# Patient Record
Sex: Female | Born: 1945 | Race: White | Hispanic: No | Marital: Married | Smoking: Former smoker
Health system: Southern US, Community
[De-identification: ages and names within clinical notes are randomized; demographics above are authoritative.]

## PROBLEM LIST (undated history)

## (undated) DIAGNOSIS — F329 Major depressive disorder, single episode, unspecified: Secondary | ICD-10-CM

## (undated) DIAGNOSIS — E785 Hyperlipidemia, unspecified: Secondary | ICD-10-CM

## (undated) DIAGNOSIS — D689 Coagulation defect, unspecified: Secondary | ICD-10-CM

## (undated) DIAGNOSIS — I10 Essential (primary) hypertension: Secondary | ICD-10-CM

## (undated) DIAGNOSIS — R011 Cardiac murmur, unspecified: Secondary | ICD-10-CM

## (undated) DIAGNOSIS — R05 Cough: Secondary | ICD-10-CM

## (undated) DIAGNOSIS — G459 Transient cerebral ischemic attack, unspecified: Secondary | ICD-10-CM

## (undated) DIAGNOSIS — J189 Pneumonia, unspecified organism: Secondary | ICD-10-CM

## (undated) DIAGNOSIS — D6800 Von Willebrand disease, unspecified: Secondary | ICD-10-CM

## (undated) DIAGNOSIS — I5032 Chronic diastolic (congestive) heart failure: Secondary | ICD-10-CM

## (undated) DIAGNOSIS — T8859XA Other complications of anesthesia, initial encounter: Secondary | ICD-10-CM

## (undated) DIAGNOSIS — M545 Low back pain, unspecified: Secondary | ICD-10-CM

## (undated) DIAGNOSIS — J449 Chronic obstructive pulmonary disease, unspecified: Secondary | ICD-10-CM

## (undated) DIAGNOSIS — M069 Rheumatoid arthritis, unspecified: Secondary | ICD-10-CM

## (undated) DIAGNOSIS — F32A Depression, unspecified: Secondary | ICD-10-CM

## (undated) DIAGNOSIS — I639 Cerebral infarction, unspecified: Secondary | ICD-10-CM

## (undated) DIAGNOSIS — T4145XA Adverse effect of unspecified anesthetic, initial encounter: Secondary | ICD-10-CM

## (undated) DIAGNOSIS — E039 Hypothyroidism, unspecified: Secondary | ICD-10-CM

## (undated) DIAGNOSIS — N189 Chronic kidney disease, unspecified: Secondary | ICD-10-CM

## (undated) DIAGNOSIS — D6861 Antiphospholipid syndrome: Secondary | ICD-10-CM

## (undated) DIAGNOSIS — I350 Nonrheumatic aortic (valve) stenosis: Secondary | ICD-10-CM

## (undated) DIAGNOSIS — IMO0001 Reserved for inherently not codable concepts without codable children: Secondary | ICD-10-CM

## (undated) DIAGNOSIS — D509 Iron deficiency anemia, unspecified: Secondary | ICD-10-CM

## (undated) DIAGNOSIS — I2510 Atherosclerotic heart disease of native coronary artery without angina pectoris: Secondary | ICD-10-CM

## (undated) DIAGNOSIS — E669 Obesity, unspecified: Secondary | ICD-10-CM

## (undated) HISTORY — DX: Low back pain, unspecified: M54.50

## (undated) HISTORY — DX: Nonrheumatic aortic (valve) stenosis: I35.0

## (undated) HISTORY — PX: STAPEDECTOMY: SHX2435

## (undated) HISTORY — DX: Antiphospholipid syndrome: D68.61

## (undated) HISTORY — PX: CHOLECYSTECTOMY: SHX55

## (undated) HISTORY — DX: Hypothyroidism, unspecified: E03.9

## (undated) HISTORY — DX: Atherosclerotic heart disease of native coronary artery without angina pectoris: I25.10

## (undated) HISTORY — DX: Essential (primary) hypertension: I10

## (undated) HISTORY — DX: Rheumatoid arthritis, unspecified: M06.9

## (undated) HISTORY — DX: Low back pain: M54.5

## (undated) HISTORY — DX: Coagulation defect, unspecified: D68.9

## (undated) HISTORY — DX: Obesity, unspecified: E66.9

## (undated) HISTORY — DX: Chronic diastolic (congestive) heart failure: I50.32

## (undated) HISTORY — DX: Hyperlipidemia, unspecified: E78.5

## (undated) HISTORY — DX: Chronic obstructive pulmonary disease, unspecified: J44.9

## (undated) HISTORY — DX: Transient cerebral ischemic attack, unspecified: G45.9

## (undated) HISTORY — PX: TOTAL ABDOMINAL HYSTERECTOMY: SHX209

## (undated) HISTORY — PX: TONSILLECTOMY: SUR1361

## (undated) HISTORY — DX: Cough: R05

## (undated) HISTORY — DX: Iron deficiency anemia, unspecified: D50.9

## (undated) HISTORY — PX: APPENDECTOMY: SHX54

## (undated) HISTORY — DX: Chronic kidney disease, unspecified: N18.9

---

## 1998-09-22 DIAGNOSIS — I639 Cerebral infarction, unspecified: Secondary | ICD-10-CM

## 1998-09-22 HISTORY — DX: Cerebral infarction, unspecified: I63.9

## 1998-09-24 ENCOUNTER — Other Ambulatory Visit: Admission: RE | Admit: 1998-09-24 | Discharge: 1998-09-24 | Payer: Self-pay | Admitting: *Deleted

## 1999-07-05 ENCOUNTER — Encounter: Payer: Self-pay | Admitting: Internal Medicine

## 1999-07-06 ENCOUNTER — Encounter: Payer: Self-pay | Admitting: Internal Medicine

## 1999-07-06 ENCOUNTER — Inpatient Hospital Stay (HOSPITAL_COMMUNITY): Admission: EM | Admit: 1999-07-06 | Discharge: 1999-07-08 | Payer: Self-pay | Admitting: Internal Medicine

## 2000-04-13 ENCOUNTER — Encounter: Payer: Self-pay | Admitting: Internal Medicine

## 2000-04-13 ENCOUNTER — Ambulatory Visit (HOSPITAL_COMMUNITY): Admission: RE | Admit: 2000-04-13 | Discharge: 2000-04-13 | Payer: Self-pay | Admitting: Internal Medicine

## 2000-04-14 ENCOUNTER — Encounter: Payer: Self-pay | Admitting: Internal Medicine

## 2000-04-14 ENCOUNTER — Ambulatory Visit (HOSPITAL_COMMUNITY): Admission: RE | Admit: 2000-04-14 | Discharge: 2000-04-14 | Payer: Self-pay | Admitting: Internal Medicine

## 2001-07-11 ENCOUNTER — Ambulatory Visit (HOSPITAL_COMMUNITY): Admission: RE | Admit: 2001-07-11 | Discharge: 2001-07-11 | Payer: Self-pay | Admitting: Oncology

## 2001-07-18 ENCOUNTER — Ambulatory Visit (HOSPITAL_COMMUNITY): Admission: RE | Admit: 2001-07-18 | Discharge: 2001-07-18 | Payer: Self-pay | Admitting: Oncology

## 2003-08-04 ENCOUNTER — Inpatient Hospital Stay (HOSPITAL_COMMUNITY): Admission: EM | Admit: 2003-08-04 | Discharge: 2003-08-05 | Payer: Self-pay | Admitting: Emergency Medicine

## 2004-05-06 ENCOUNTER — Encounter: Payer: Self-pay | Admitting: Internal Medicine

## 2004-05-10 ENCOUNTER — Ambulatory Visit (HOSPITAL_COMMUNITY): Admission: RE | Admit: 2004-05-10 | Discharge: 2004-05-10 | Payer: Self-pay | Admitting: Internal Medicine

## 2004-05-14 ENCOUNTER — Ambulatory Visit (HOSPITAL_COMMUNITY): Admission: RE | Admit: 2004-05-14 | Discharge: 2004-05-14 | Payer: Self-pay | Admitting: *Deleted

## 2004-05-14 ENCOUNTER — Encounter: Payer: Self-pay | Admitting: Internal Medicine

## 2004-08-13 ENCOUNTER — Ambulatory Visit: Payer: Self-pay | Admitting: Oncology

## 2004-09-17 ENCOUNTER — Ambulatory Visit (HOSPITAL_COMMUNITY): Admission: RE | Admit: 2004-09-17 | Discharge: 2004-09-17 | Payer: Self-pay | Admitting: Internal Medicine

## 2004-10-28 ENCOUNTER — Ambulatory Visit: Payer: Self-pay | Admitting: Oncology

## 2004-12-27 ENCOUNTER — Ambulatory Visit: Payer: Self-pay | Admitting: Oncology

## 2005-01-20 ENCOUNTER — Encounter: Payer: Self-pay | Admitting: Internal Medicine

## 2005-01-20 ENCOUNTER — Ambulatory Visit: Payer: Self-pay | Admitting: Internal Medicine

## 2005-02-21 ENCOUNTER — Ambulatory Visit: Payer: Self-pay | Admitting: Oncology

## 2005-04-25 ENCOUNTER — Ambulatory Visit: Payer: Self-pay | Admitting: Oncology

## 2005-05-20 ENCOUNTER — Ambulatory Visit: Payer: Self-pay | Admitting: Internal Medicine

## 2005-05-20 ENCOUNTER — Encounter: Payer: Self-pay | Admitting: Internal Medicine

## 2005-05-27 ENCOUNTER — Ambulatory Visit: Payer: Self-pay | Admitting: Cardiology

## 2005-06-11 ENCOUNTER — Ambulatory Visit: Payer: Self-pay | Admitting: Oncology

## 2005-07-28 ENCOUNTER — Ambulatory Visit: Payer: Self-pay | Admitting: Oncology

## 2005-09-28 ENCOUNTER — Emergency Department (HOSPITAL_COMMUNITY): Admission: EM | Admit: 2005-09-28 | Discharge: 2005-09-28 | Payer: Self-pay | Admitting: Emergency Medicine

## 2005-10-14 ENCOUNTER — Ambulatory Visit: Payer: Self-pay | Admitting: Oncology

## 2005-12-15 ENCOUNTER — Ambulatory Visit: Payer: Self-pay | Admitting: Oncology

## 2006-01-01 LAB — PROTIME-INR
INR: 3.4 (ref 2.00–3.50)
Protime: 22.3 Seconds — ABNORMAL HIGH (ref 10.6–13.4)

## 2006-01-08 LAB — PROTIME-INR
INR: 2.5 (ref 2.00–3.50)
Protime: 19 Seconds — ABNORMAL HIGH (ref 10.6–13.4)

## 2006-01-23 LAB — PROTIME-INR
INR: 2.4 (ref 2.00–3.50)
Protime: 18.9 Seconds — ABNORMAL HIGH (ref 10.6–13.4)

## 2006-02-06 ENCOUNTER — Ambulatory Visit: Payer: Self-pay | Admitting: Oncology

## 2006-02-10 LAB — PROTIME-INR
INR: 3.7 (ref 2.00–3.50)
Protime: 23.4 Seconds (ref 10.6–13.4)

## 2006-03-10 LAB — PROTIME-INR
INR: 1.5 (ref 2.00–3.50)
Protime: 15 Seconds (ref 10.6–13.4)

## 2006-03-19 LAB — PROTIME-INR
INR: 1.8 — ABNORMAL LOW (ref 2.00–3.50)
Protime: 16.7 Seconds — ABNORMAL HIGH (ref 10.6–13.4)

## 2006-04-01 ENCOUNTER — Ambulatory Visit: Payer: Self-pay | Admitting: Oncology

## 2006-05-05 LAB — PROTIME-INR
INR: 2.4 (ref 2.00–3.50)
Protime: 28.8 s — ABNORMAL HIGH (ref 10.6–13.4)

## 2006-06-09 ENCOUNTER — Ambulatory Visit: Payer: Self-pay | Admitting: Oncology

## 2006-07-01 LAB — PROTIME-INR: INR: 2.1 (ref 2.00–3.50)

## 2006-07-27 ENCOUNTER — Ambulatory Visit: Payer: Self-pay | Admitting: Oncology

## 2006-07-29 LAB — PROTIME-INR
INR: 1.9 — ABNORMAL LOW (ref 2.00–3.50)
Protime: 22.8 Seconds — ABNORMAL HIGH (ref 10.6–13.4)

## 2006-08-19 LAB — PROTIME-INR

## 2006-08-27 LAB — PROTIME-INR
INR: 1.9 — ABNORMAL LOW (ref 2.00–3.50)
Protime: 22.8 Seconds — ABNORMAL HIGH (ref 10.6–13.4)

## 2006-09-17 ENCOUNTER — Ambulatory Visit: Payer: Self-pay | Admitting: Oncology

## 2006-09-23 LAB — PROTIME-INR

## 2006-10-21 LAB — PROTIME-INR

## 2006-10-28 LAB — PROTIME-INR
INR: 2.7 (ref 2.00–3.50)
Protime: 32.4 Seconds — ABNORMAL HIGH (ref 10.6–13.4)

## 2006-11-16 ENCOUNTER — Ambulatory Visit: Payer: Self-pay | Admitting: Oncology

## 2006-11-18 LAB — PROTIME-INR
INR: 2.9 (ref 2.00–3.50)
Protime: 34.8 Seconds — ABNORMAL HIGH (ref 10.6–13.4)

## 2006-12-22 ENCOUNTER — Encounter: Admission: RE | Admit: 2006-12-22 | Discharge: 2006-12-22 | Payer: Self-pay | Admitting: Orthopedic Surgery

## 2006-12-22 LAB — PROTIME-INR: INR: 3.2 (ref 2.00–3.50)

## 2007-01-12 ENCOUNTER — Ambulatory Visit: Payer: Self-pay | Admitting: Oncology

## 2007-01-15 LAB — CBC WITH DIFFERENTIAL/PLATELET
BASO%: 0.5 % (ref 0.0–2.0)
EOS%: 2.1 % (ref 0.0–7.0)
HCT: 38.6 % (ref 34.8–46.6)
LYMPH%: 22.4 % (ref 14.0–48.0)
MCH: 28.8 pg (ref 26.0–34.0)
MCHC: 34.7 g/dL (ref 32.0–36.0)
MCV: 83.2 fL (ref 81.0–101.0)
MONO#: 0.4 10*3/uL (ref 0.1–0.9)
NEUT%: 69.3 % (ref 39.6–76.8)
Platelets: 167 10*3/uL (ref 145–400)

## 2007-01-20 LAB — CARDIOLIPIN ANTIBODIES, IGG, IGM, IGA
Anticardiolipin IgA: <7 [APL'U] (ref <13)
Anticardiolipin IgM: <7 [MPL'U] (ref <10)

## 2007-01-20 LAB — LUPUS ANTICOAGULANT PANEL
DRVVT 1:1 Mix: 47 secs (ref 36.1–47.0)
DRVVT: 93.6 secs — ABNORMAL HIGH (ref 36.1–47.0)
PTTLA 4:1 Mix: 79.4 secs — ABNORMAL HIGH (ref 36.3–48.8)
PTTLA Confirmation: 0 secs (ref <8.0)

## 2007-02-18 LAB — PROTIME-INR
INR: 3.3 (ref 2.00–3.50)
Protime: 39.6 Seconds — ABNORMAL HIGH (ref 10.6–13.4)

## 2007-03-15 ENCOUNTER — Ambulatory Visit: Payer: Self-pay | Admitting: Oncology

## 2007-03-18 LAB — PROTIME-INR
INR: 3.6 — ABNORMAL HIGH (ref 2.00–3.50)
Protime: 43.2 Seconds — ABNORMAL HIGH (ref 10.6–13.4)

## 2007-04-15 LAB — PROTIME-INR: INR: 2.4 (ref 2.00–3.50)

## 2007-05-09 ENCOUNTER — Ambulatory Visit: Payer: Self-pay | Admitting: Oncology

## 2007-05-13 LAB — PROTIME-INR: INR: 3.3 (ref 2.00–3.50)

## 2007-06-10 LAB — PROTIME-INR: Protime: 37.2 Seconds — ABNORMAL HIGH (ref 10.6–13.4)

## 2007-07-06 ENCOUNTER — Ambulatory Visit: Payer: Self-pay | Admitting: Oncology

## 2007-07-08 LAB — PROTIME-INR: Protime: 32.4 Seconds — ABNORMAL HIGH (ref 10.6–13.4)

## 2007-08-05 LAB — PROTIME-INR: Protime: 32.4 Seconds — ABNORMAL HIGH (ref 10.6–13.4)

## 2007-08-31 ENCOUNTER — Ambulatory Visit: Payer: Self-pay | Admitting: Oncology

## 2007-09-02 LAB — PROTIME-INR
INR: 2.9 (ref 2.00–3.50)
Protime: 34.8 Seconds — ABNORMAL HIGH (ref 10.6–13.4)

## 2007-09-30 LAB — PROTIME-INR
INR: 3.7 — ABNORMAL HIGH (ref 2.00–3.50)
Protime: 44.4 Seconds — ABNORMAL HIGH (ref 10.6–13.4)

## 2007-10-26 ENCOUNTER — Ambulatory Visit: Payer: Self-pay | Admitting: Oncology

## 2007-10-28 LAB — PROTIME-INR
INR: 4.1 — ABNORMAL HIGH (ref 2.00–3.50)
Protime: 49.2 Seconds — ABNORMAL HIGH (ref 10.6–13.4)

## 2007-11-15 LAB — PROTIME-INR

## 2007-11-25 LAB — PROTIME-INR

## 2007-12-07 ENCOUNTER — Ambulatory Visit: Payer: Self-pay | Admitting: Oncology

## 2007-12-09 LAB — PROTIME-INR
INR: 2.7 (ref 2.00–3.50)
Protime: 32.4 Seconds — ABNORMAL HIGH (ref 10.6–13.4)

## 2007-12-23 LAB — PROTIME-INR
INR: 2.5 (ref 2.00–3.50)
Protime: 30 Seconds — ABNORMAL HIGH (ref 10.6–13.4)

## 2008-01-18 ENCOUNTER — Ambulatory Visit: Payer: Self-pay | Admitting: Oncology

## 2008-01-20 ENCOUNTER — Encounter: Payer: Self-pay | Admitting: Internal Medicine

## 2008-01-20 LAB — PROTIME-INR
INR: 2.8 (ref 2.00–3.50)
Protime: 33.6 Seconds — ABNORMAL HIGH (ref 10.6–13.4)

## 2008-03-14 ENCOUNTER — Ambulatory Visit: Payer: Self-pay | Admitting: Oncology

## 2008-04-13 LAB — PROTIME-INR: INR: 2.1 (ref 2.00–3.50)

## 2008-05-07 ENCOUNTER — Ambulatory Visit: Payer: Self-pay | Admitting: Oncology

## 2008-05-11 LAB — PROTIME-INR: INR: 2.8 (ref 2.00–3.50)

## 2008-07-04 ENCOUNTER — Ambulatory Visit: Payer: Self-pay | Admitting: Oncology

## 2008-07-06 LAB — PROTIME-INR
INR: 2.3 (ref 2.00–3.50)
Protime: 27.6 Seconds — ABNORMAL HIGH (ref 10.6–13.4)

## 2008-08-03 LAB — PROTIME-INR: Protime: 33.6 Seconds — ABNORMAL HIGH (ref 10.6–13.4)

## 2008-08-29 ENCOUNTER — Ambulatory Visit: Payer: Self-pay | Admitting: Oncology

## 2008-08-30 LAB — PROTIME-INR

## 2008-09-18 LAB — PROTIME-INR

## 2008-10-24 ENCOUNTER — Ambulatory Visit: Payer: Self-pay | Admitting: Oncology

## 2008-10-31 LAB — PROTIME-INR
INR: 2.8 (ref 2.00–3.50)
Protime: 33.6 Seconds — ABNORMAL HIGH (ref 10.6–13.4)

## 2008-11-23 LAB — PROTIME-INR

## 2008-12-18 ENCOUNTER — Ambulatory Visit: Payer: Self-pay | Admitting: Oncology

## 2008-12-27 LAB — PROTIME-INR: INR: 1.9 — ABNORMAL LOW (ref 2.00–3.50)

## 2009-01-08 LAB — PROTIME-INR
INR: 3.1 (ref 2.00–3.50)
Protime: 37.2 Seconds — ABNORMAL HIGH (ref 10.6–13.4)

## 2009-01-25 ENCOUNTER — Encounter: Payer: Self-pay | Admitting: Internal Medicine

## 2009-01-25 LAB — PROTIME-INR: Protime: 40.8 Seconds — ABNORMAL HIGH (ref 10.6–13.4)

## 2009-02-20 ENCOUNTER — Ambulatory Visit: Payer: Self-pay | Admitting: Oncology

## 2009-02-22 LAB — PROTIME-INR: Protime: 33.6 Seconds — ABNORMAL HIGH (ref 10.6–13.4)

## 2009-02-22 LAB — CBC WITH DIFFERENTIAL/PLATELET
EOS%: 4.9 % (ref 0.0–7.0)
MCH: 29.2 pg (ref 25.1–34.0)
MCV: 86.6 fL (ref 79.5–101.0)
MONO%: 7.1 % (ref 0.0–14.0)
NEUT#: 5.6 10*3/uL (ref 1.5–6.5)
RBC: 4.69 10*6/uL (ref 3.70–5.45)
RDW: 13.8 % (ref 11.2–14.5)
nRBC: 0 % (ref 0–0)

## 2009-03-22 ENCOUNTER — Ambulatory Visit: Payer: Self-pay | Admitting: Oncology

## 2009-03-22 LAB — PROTIME-INR
INR: 4.2 — ABNORMAL HIGH (ref 2.00–3.50)
Protime: 50.4 Seconds — ABNORMAL HIGH (ref 10.6–13.4)

## 2009-03-22 LAB — CBC WITH DIFFERENTIAL/PLATELET
Basophils Absolute: 0 10*3/uL (ref 0.0–0.1)
EOS%: 2.1 % (ref 0.0–7.0)
Eosinophils Absolute: 0.2 10*3/uL (ref 0.0–0.5)
HCT: 38.5 % (ref 34.8–46.6)
HGB: 13 g/dL (ref 11.6–15.9)
MCH: 29.1 pg (ref 25.1–34.0)
MONO#: 0.6 10*3/uL (ref 0.1–0.9)
NEUT#: 6.5 10*3/uL (ref 1.5–6.5)
RDW: 14.3 % (ref 11.2–14.5)
WBC: 8.4 10*3/uL (ref 3.9–10.3)
lymph#: 1.2 10*3/uL (ref 0.9–3.3)

## 2009-04-19 LAB — CBC WITH DIFFERENTIAL/PLATELET
EOS%: 2.4 % (ref 0.0–7.0)
Eosinophils Absolute: 0.2 10*3/uL (ref 0.0–0.5)
MCV: 86.3 fL (ref 79.5–101.0)
MONO%: 8.6 % (ref 0.0–14.0)
NEUT#: 4.9 10*3/uL (ref 1.5–6.5)
RBC: 4.51 10*6/uL (ref 3.70–5.45)
RDW: 14.7 % — ABNORMAL HIGH (ref 11.2–14.5)
lymph#: 2.2 10*3/uL (ref 0.9–3.3)

## 2009-04-19 LAB — PROTIME-INR
INR: 4.3 — ABNORMAL HIGH (ref 2.00–3.50)
Protime: 51.6 Seconds — ABNORMAL HIGH (ref 10.6–13.4)

## 2009-05-01 ENCOUNTER — Ambulatory Visit: Payer: Self-pay | Admitting: Oncology

## 2009-05-01 LAB — PROTIME-INR
INR: 2.7 (ref 2.00–3.50)
Protime: 32.4 Seconds — ABNORMAL HIGH (ref 10.6–13.4)

## 2009-05-17 LAB — CBC WITH DIFFERENTIAL/PLATELET
BASO%: 0.7 % (ref 0.0–2.0)
EOS%: 5.6 % (ref 0.0–7.0)
HGB: 13.4 g/dL (ref 11.6–15.9)
MCH: 29.6 pg (ref 25.1–34.0)
MCHC: 33.7 g/dL (ref 31.5–36.0)
RBC: 4.53 10*6/uL (ref 3.70–5.45)
RDW: 14.3 % (ref 11.2–14.5)
lymph#: 2 10*3/uL (ref 0.9–3.3)

## 2009-05-17 LAB — PROTIME-INR: INR: 3.4 (ref 2.00–3.50)

## 2009-06-12 ENCOUNTER — Ambulatory Visit: Payer: Self-pay | Admitting: Oncology

## 2009-06-14 LAB — CBC WITH DIFFERENTIAL/PLATELET
Basophils Absolute: 0 10*3/uL (ref 0.0–0.1)
Eosinophils Absolute: 0.2 10*3/uL (ref 0.0–0.5)
HGB: 13.3 g/dL (ref 11.6–15.9)
MCV: 88.5 fL (ref 79.5–101.0)
MONO%: 9.5 % (ref 0.0–14.0)
NEUT#: 2.9 10*3/uL (ref 1.5–6.5)
Platelets: 147 10*3/uL (ref 145–400)
RDW: 13 % (ref 11.2–14.5)

## 2009-06-14 LAB — PROTIME-INR: INR: 2.3 (ref 2.00–3.50)

## 2009-07-10 ENCOUNTER — Ambulatory Visit: Payer: Self-pay | Admitting: Oncology

## 2009-07-12 LAB — CBC WITH DIFFERENTIAL/PLATELET
Basophils Absolute: 0 10*3/uL (ref 0.0–0.1)
EOS%: 3.6 % (ref 0.0–7.0)
Eosinophils Absolute: 0.2 10*3/uL (ref 0.0–0.5)
HCT: 39.5 % (ref 34.8–46.6)
HGB: 13.4 g/dL (ref 11.6–15.9)
MONO#: 0.5 10*3/uL (ref 0.1–0.9)
NEUT#: 3.4 10*3/uL (ref 1.5–6.5)
NEUT%: 57.8 % (ref 38.4–76.8)
RDW: 12.8 % (ref 11.2–14.5)
WBC: 5.9 10*3/uL (ref 3.9–10.3)
lymph#: 1.7 10*3/uL (ref 0.9–3.3)

## 2009-07-12 LAB — PROTIME-INR: INR: 2.3 (ref 2.00–3.50)

## 2009-08-09 ENCOUNTER — Ambulatory Visit: Payer: Self-pay | Admitting: Oncology

## 2009-08-09 LAB — CBC WITH DIFFERENTIAL/PLATELET
Basophils Absolute: 0 10*3/uL (ref 0.0–0.1)
Eosinophils Absolute: 0.2 10*3/uL (ref 0.0–0.5)
HCT: 40.6 % (ref 34.8–46.6)
HGB: 13.5 g/dL (ref 11.6–15.9)
MCH: 30 pg (ref 25.1–34.0)
NEUT#: 3.5 10*3/uL (ref 1.5–6.5)
NEUT%: 53.4 % (ref 38.4–76.8)
RDW: 13.1 % (ref 11.2–14.5)
lymph#: 2.2 10*3/uL (ref 0.9–3.3)

## 2009-08-09 LAB — PROTIME-INR: INR: 3.6 — ABNORMAL HIGH (ref 2.00–3.50)

## 2009-09-07 LAB — CBC WITH DIFFERENTIAL/PLATELET
Basophils Absolute: 0 10*3/uL (ref 0.0–0.1)
EOS%: 3.2 % (ref 0.0–7.0)
Eosinophils Absolute: 0.2 10*3/uL (ref 0.0–0.5)
HCT: 39 % (ref 34.8–46.6)
HGB: 12.9 g/dL (ref 11.6–15.9)
MCH: 29.7 pg (ref 25.1–34.0)
MCV: 89.7 fL (ref 79.5–101.0)
MONO%: 7.2 % (ref 0.0–14.0)
NEUT#: 3.8 10*3/uL (ref 1.5–6.5)
NEUT%: 55.5 % (ref 38.4–76.8)
RDW: 13.3 % (ref 11.2–14.5)
lymph#: 2.3 10*3/uL (ref 0.9–3.3)

## 2009-09-07 LAB — PROTIME-INR: INR: 2.2 (ref 2.00–3.50)

## 2009-10-02 ENCOUNTER — Ambulatory Visit: Payer: Self-pay | Admitting: Oncology

## 2009-10-04 LAB — CBC WITH DIFFERENTIAL/PLATELET
BASO%: 0.6 % (ref 0.0–2.0)
Basophils Absolute: 0.1 10*3/uL (ref 0.0–0.1)
EOS%: 3.5 % (ref 0.0–7.0)
Eosinophils Absolute: 0.3 10*3/uL (ref 0.0–0.5)
HCT: 41.8 % (ref 34.8–46.6)
HGB: 13.8 g/dL (ref 11.6–15.9)
LYMPH%: 29.2 % (ref 14.0–49.7)
MCH: 29.6 pg (ref 25.1–34.0)
MCHC: 33 g/dL (ref 31.5–36.0)
MCV: 89.7 fL (ref 79.5–101.0)
MONO#: 0.7 10*3/uL (ref 0.1–0.9)
MONO%: 8.2 % (ref 0.0–14.0)
NEUT#: 4.7 10*3/uL (ref 1.5–6.5)
NEUT%: 58.5 % (ref 38.4–76.8)
Platelets: 162 10*3/uL (ref 145–400)
RBC: 4.66 10*6/uL (ref 3.70–5.45)
RDW: 13.7 % (ref 11.2–14.5)
WBC: 8 10*3/uL (ref 3.9–10.3)
lymph#: 2.3 10*3/uL (ref 0.9–3.3)

## 2009-10-04 LAB — PROTIME-INR
INR: 2.1 (ref 2.00–3.50)
Protime: 25.2 Seconds — ABNORMAL HIGH (ref 10.6–13.4)

## 2009-11-01 ENCOUNTER — Ambulatory Visit: Payer: Self-pay | Admitting: Oncology

## 2009-11-01 LAB — CBC WITH DIFFERENTIAL/PLATELET
BASO%: 0.6 % (ref 0.0–2.0)
Basophils Absolute: 0 10*3/uL (ref 0.0–0.1)
EOS%: 2.8 % (ref 0.0–7.0)
Eosinophils Absolute: 0.2 10*3/uL (ref 0.0–0.5)
HCT: 40.5 % (ref 34.8–46.6)
HGB: 13.4 g/dL (ref 11.6–15.9)
LYMPH%: 24.7 % (ref 14.0–49.7)
MCH: 29.6 pg (ref 25.1–34.0)
MCHC: 33.1 g/dL (ref 31.5–36.0)
MCV: 89.6 fL (ref 79.5–101.0)
MONO#: 0.5 10*3/uL (ref 0.1–0.9)
MONO%: 7 % (ref 0.0–14.0)
NEUT#: 4.6 10*3/uL (ref 1.5–6.5)
NEUT%: 64.9 % (ref 38.4–76.8)
Platelets: 160 10*3/uL (ref 145–400)
RBC: 4.52 10*6/uL (ref 3.70–5.45)
RDW: 13.4 % (ref 11.2–14.5)
WBC: 7.1 10*3/uL (ref 3.9–10.3)
lymph#: 1.7 10*3/uL (ref 0.9–3.3)

## 2009-11-01 LAB — PROTIME-INR

## 2009-11-29 LAB — PROTIME-INR
INR: 2.3 (ref 2.00–3.50)
Protime: 27.6 Seconds — ABNORMAL HIGH (ref 10.6–13.4)

## 2009-11-29 LAB — CBC WITH DIFFERENTIAL/PLATELET
BASO%: 0.2 % (ref 0.0–2.0)
Basophils Absolute: 0 10*3/uL (ref 0.0–0.1)
EOS%: 0.2 % (ref 0.0–7.0)
Eosinophils Absolute: 0 10*3/uL (ref 0.0–0.5)
HCT: 41.6 % (ref 34.8–46.6)
HGB: 13.6 g/dL (ref 11.6–15.9)
LYMPH%: 12.6 % — ABNORMAL LOW (ref 14.0–49.7)
MCH: 29.4 pg (ref 25.1–34.0)
MCHC: 32.7 g/dL (ref 31.5–36.0)
MCV: 90 fL (ref 79.5–101.0)
MONO#: 0.6 10*3/uL (ref 0.1–0.9)
MONO%: 4.9 % (ref 0.0–14.0)
NEUT#: 10.3 10*3/uL — ABNORMAL HIGH (ref 1.5–6.5)
NEUT%: 82.1 % — ABNORMAL HIGH (ref 38.4–76.8)
Platelets: 196 10*3/uL (ref 145–400)
RBC: 4.62 10*6/uL (ref 3.70–5.45)
RDW: 13.3 % (ref 11.2–14.5)
WBC: 12.5 10*3/uL — ABNORMAL HIGH (ref 3.9–10.3)
lymph#: 1.6 10*3/uL (ref 0.9–3.3)
nRBC: 0 % (ref 0–0)

## 2009-12-25 ENCOUNTER — Ambulatory Visit: Payer: Self-pay | Admitting: Oncology

## 2009-12-27 LAB — CBC WITH DIFFERENTIAL/PLATELET
BASO%: 0.3 % (ref 0.0–2.0)
Basophils Absolute: 0 10*3/uL (ref 0.0–0.1)
EOS%: 3.8 % (ref 0.0–7.0)
Eosinophils Absolute: 0.2 10*3/uL (ref 0.0–0.5)
HCT: 38.4 % (ref 34.8–46.6)
HGB: 12.8 g/dL (ref 11.6–15.9)
LYMPH%: 34 % (ref 14.0–49.7)
MCH: 29.5 pg (ref 25.1–34.0)
MCHC: 33.3 g/dL (ref 31.5–36.0)
MCV: 88.5 fL (ref 79.5–101.0)
MONO#: 0.4 10*3/uL (ref 0.1–0.9)
MONO%: 7.2 % (ref 0.0–14.0)
NEUT#: 3.2 10*3/uL (ref 1.5–6.5)
NEUT%: 54.7 % (ref 38.4–76.8)
Platelets: 148 10*3/uL (ref 145–400)
RBC: 4.34 10*6/uL (ref 3.70–5.45)
RDW: 13.4 % (ref 11.2–14.5)
WBC: 5.8 10*3/uL (ref 3.9–10.3)
lymph#: 2 10*3/uL (ref 0.9–3.3)
nRBC: 0 % (ref 0–0)

## 2009-12-27 LAB — PROTIME-INR
INR: 2.9 (ref 2.00–3.50)
Protime: 34.8 Seconds — ABNORMAL HIGH (ref 10.6–13.4)

## 2010-01-30 ENCOUNTER — Ambulatory Visit: Payer: Self-pay | Admitting: Oncology

## 2010-01-31 ENCOUNTER — Encounter: Payer: Self-pay | Admitting: Internal Medicine

## 2010-01-31 LAB — CBC WITH DIFFERENTIAL/PLATELET
BASO%: 0.5 % (ref 0.0–2.0)
Basophils Absolute: 0 10*3/uL (ref 0.0–0.1)
EOS%: 4.2 % (ref 0.0–7.0)
Eosinophils Absolute: 0.3 10*3/uL (ref 0.0–0.5)
HCT: 41.6 % (ref 34.8–46.6)
HGB: 13.9 g/dL (ref 11.6–15.9)
LYMPH%: 27.7 % (ref 14.0–49.7)
MCH: 29.5 pg (ref 25.1–34.0)
MCHC: 33.4 g/dL (ref 31.5–36.0)
MCV: 88.3 fL (ref 79.5–101.0)
MONO#: 0.5 10*3/uL (ref 0.1–0.9)
MONO%: 7.5 % (ref 0.0–14.0)
NEUT#: 3.7 10*3/uL (ref 1.5–6.5)
NEUT%: 60.1 % (ref 38.4–76.8)
Platelets: 151 10*3/uL (ref 145–400)
RBC: 4.71 10*6/uL (ref 3.70–5.45)
RDW: 13.7 % (ref 11.2–14.5)
WBC: 6.1 10*3/uL (ref 3.9–10.3)
lymph#: 1.7 10*3/uL (ref 0.9–3.3)

## 2010-01-31 LAB — PROTIME-INR
INR: 2.8 (ref 2.00–3.50)
Protime: 33.6 Seconds — ABNORMAL HIGH (ref 10.6–13.4)

## 2010-02-13 ENCOUNTER — Encounter: Payer: Self-pay | Admitting: Cardiology

## 2010-02-21 ENCOUNTER — Encounter (INDEPENDENT_AMBULATORY_CARE_PROVIDER_SITE_OTHER): Payer: Self-pay | Admitting: *Deleted

## 2010-02-27 LAB — PROTIME-INR
INR: 2.6 (ref 2.00–3.50)
Protime: 31.2 Seconds — ABNORMAL HIGH (ref 10.6–13.4)

## 2010-02-27 LAB — CBC WITH DIFFERENTIAL/PLATELET
BASO%: 0.3 % (ref 0.0–2.0)
Basophils Absolute: 0 10*3/uL (ref 0.0–0.1)
EOS%: 1.9 % (ref 0.0–7.0)
Eosinophils Absolute: 0.2 10*3/uL (ref 0.0–0.5)
HCT: 42.6 % (ref 34.8–46.6)
HGB: 14.1 g/dL (ref 11.6–15.9)
LYMPH%: 15.6 % (ref 14.0–49.7)
MCH: 29.6 pg (ref 25.1–34.0)
MCHC: 33.1 g/dL (ref 31.5–36.0)
MCV: 89.5 fL (ref 79.5–101.0)
MONO#: 0.9 10*3/uL (ref 0.1–0.9)
MONO%: 9.9 % (ref 0.0–14.0)
NEUT#: 6.7 10*3/uL — ABNORMAL HIGH (ref 1.5–6.5)
NEUT%: 72.3 % (ref 38.4–76.8)
Platelets: 156 10*3/uL (ref 145–400)
RBC: 4.76 10*6/uL (ref 3.70–5.45)
RDW: 13.9 % (ref 11.2–14.5)
WBC: 9.3 10*3/uL (ref 3.9–10.3)
lymph#: 1.5 10*3/uL (ref 0.9–3.3)
nRBC: 0 % (ref 0–0)

## 2010-03-28 ENCOUNTER — Ambulatory Visit: Payer: Self-pay | Admitting: Oncology

## 2010-04-01 LAB — CBC WITH DIFFERENTIAL/PLATELET
BASO%: 0.2 % (ref 0.0–2.0)
Basophils Absolute: 0 10*3/uL (ref 0.0–0.1)
EOS%: 1.7 % (ref 0.0–7.0)
Eosinophils Absolute: 0.2 10*3/uL (ref 0.0–0.5)
HCT: 40.9 % (ref 34.8–46.6)
HGB: 13.5 g/dL (ref 11.6–15.9)
LYMPH%: 16.6 % (ref 14.0–49.7)
MCH: 29.3 pg (ref 25.1–34.0)
MCHC: 33 g/dL (ref 31.5–36.0)
MCV: 88.9 fL (ref 79.5–101.0)
MONO#: 0.8 10*3/uL (ref 0.1–0.9)
MONO%: 7.9 % (ref 0.0–14.0)
NEUT#: 7 10*3/uL — ABNORMAL HIGH (ref 1.5–6.5)
NEUT%: 73.6 % (ref 38.4–76.8)
Platelets: 168 10*3/uL (ref 145–400)
RBC: 4.6 10*6/uL (ref 3.70–5.45)
RDW: 14 % (ref 11.2–14.5)
WBC: 9.5 10*3/uL (ref 3.9–10.3)
lymph#: 1.6 10*3/uL (ref 0.9–3.3)

## 2010-04-01 LAB — PROTIME-INR
INR: 3.8 — ABNORMAL HIGH (ref 2.00–3.50)
Protime: 45.6 Seconds — ABNORMAL HIGH (ref 10.6–13.4)

## 2010-04-26 LAB — CBC WITH DIFFERENTIAL/PLATELET
BASO%: 0.2 % (ref 0.0–2.0)
Basophils Absolute: 0 10*3/uL (ref 0.0–0.1)
EOS%: 0.7 % (ref 0.0–7.0)
Eosinophils Absolute: 0.1 10*3/uL (ref 0.0–0.5)
HCT: 38 % (ref 34.8–46.6)
HGB: 12.4 g/dL (ref 11.6–15.9)
LYMPH%: 10.9 % — ABNORMAL LOW (ref 14.0–49.7)
MCH: 29 pg (ref 25.1–34.0)
MCHC: 32.6 g/dL (ref 31.5–36.0)
MCV: 89 fL (ref 79.5–101.0)
MONO#: 0.7 10*3/uL (ref 0.1–0.9)
MONO%: 6.9 % (ref 0.0–14.0)
NEUT#: 7.9 10*3/uL — ABNORMAL HIGH (ref 1.5–6.5)
NEUT%: 81.3 % — ABNORMAL HIGH (ref 38.4–76.8)
Platelets: 184 10*3/uL (ref 145–400)
RBC: 4.27 10*6/uL (ref 3.70–5.45)
RDW: 14.1 % (ref 11.2–14.5)
WBC: 9.8 10*3/uL (ref 3.9–10.3)
lymph#: 1.1 10*3/uL (ref 0.9–3.3)

## 2010-04-26 LAB — PROTIME-INR
INR: 2.2 (ref 2.00–3.50)
Protime: 26.4 Seconds — ABNORMAL HIGH (ref 10.6–13.4)

## 2010-04-29 ENCOUNTER — Ambulatory Visit: Payer: Self-pay | Admitting: Cardiology

## 2010-04-29 DIAGNOSIS — R011 Cardiac murmur, unspecified: Secondary | ICD-10-CM

## 2010-04-29 DIAGNOSIS — I10 Essential (primary) hypertension: Secondary | ICD-10-CM

## 2010-05-09 ENCOUNTER — Telehealth: Payer: Self-pay | Admitting: Cardiology

## 2010-05-10 ENCOUNTER — Telehealth: Payer: Self-pay | Admitting: Cardiology

## 2010-05-14 ENCOUNTER — Encounter: Payer: Self-pay | Admitting: Cardiology

## 2010-05-14 ENCOUNTER — Ambulatory Visit (HOSPITAL_COMMUNITY): Admission: RE | Admit: 2010-05-14 | Discharge: 2010-05-14 | Payer: Self-pay | Admitting: Cardiology

## 2010-05-14 ENCOUNTER — Encounter (INDEPENDENT_AMBULATORY_CARE_PROVIDER_SITE_OTHER): Payer: Self-pay | Admitting: *Deleted

## 2010-05-14 ENCOUNTER — Ambulatory Visit: Payer: Self-pay | Admitting: Cardiovascular Disease

## 2010-05-14 ENCOUNTER — Ambulatory Visit: Payer: Self-pay

## 2010-05-15 ENCOUNTER — Telehealth: Payer: Self-pay | Admitting: Cardiology

## 2010-05-17 ENCOUNTER — Ambulatory Visit: Payer: Self-pay | Admitting: Oncology

## 2010-05-21 LAB — CBC WITH DIFFERENTIAL/PLATELET
BASO%: 0.3 % (ref 0.0–2.0)
Basophils Absolute: 0 10*3/uL (ref 0.0–0.1)
EOS%: 1.5 % (ref 0.0–7.0)
Eosinophils Absolute: 0.2 10*3/uL (ref 0.0–0.5)
HCT: 39.3 % (ref 34.8–46.6)
HGB: 12.7 g/dL (ref 11.6–15.9)
LYMPH%: 16.2 % (ref 14.0–49.7)
MCH: 28.7 pg (ref 25.1–34.0)
MCHC: 32.3 g/dL (ref 31.5–36.0)
MCV: 88.7 fL (ref 79.5–101.0)
MONO#: 0.9 10*3/uL (ref 0.1–0.9)
MONO%: 7.7 % (ref 0.0–14.0)
NEUT#: 8.2 10*3/uL — ABNORMAL HIGH (ref 1.5–6.5)
NEUT%: 74.3 % (ref 38.4–76.8)
Platelets: 186 10*3/uL (ref 145–400)
RBC: 4.43 10*6/uL (ref 3.70–5.45)
RDW: 14.3 % (ref 11.2–14.5)
WBC: 11.1 10*3/uL — ABNORMAL HIGH (ref 3.9–10.3)
lymph#: 1.8 10*3/uL (ref 0.9–3.3)

## 2010-05-21 LAB — PROTIME-INR
INR: 2.2 (ref 2.00–3.50)
Protime: 26.4 Seconds — ABNORMAL HIGH (ref 10.6–13.4)

## 2010-05-23 ENCOUNTER — Telehealth: Payer: Self-pay | Admitting: Cardiology

## 2010-05-31 ENCOUNTER — Telehealth: Payer: Self-pay | Admitting: Cardiology

## 2010-05-31 ENCOUNTER — Ambulatory Visit: Payer: Self-pay | Admitting: Cardiology

## 2010-05-31 LAB — CONVERTED CEMR LAB
BUN: 19 mg/dL (ref 6–23)
CO2: 35 meq/L — ABNORMAL HIGH (ref 19–32)
Calcium: 9.6 mg/dL (ref 8.4–10.5)
Chloride: 99 meq/L (ref 96–112)
Creatinine, Ser: 1.3 mg/dL — ABNORMAL HIGH (ref 0.4–1.2)
GFR calc non Af Amer: 44.78 mL/min (ref >60)
Glucose, Bld: 121 mg/dL — ABNORMAL HIGH (ref 70–99)
Potassium: 3.1 meq/L — ABNORMAL LOW (ref 3.5–5.1)
Sodium: 144 meq/L (ref 135–145)

## 2010-06-04 ENCOUNTER — Ambulatory Visit: Payer: Self-pay | Admitting: Cardiology

## 2010-06-06 ENCOUNTER — Telehealth: Payer: Self-pay | Admitting: Cardiology

## 2010-06-12 ENCOUNTER — Telehealth: Payer: Self-pay | Admitting: Cardiology

## 2010-06-18 ENCOUNTER — Telehealth: Payer: Self-pay | Admitting: Cardiology

## 2010-06-18 ENCOUNTER — Ambulatory Visit: Payer: Self-pay | Admitting: Oncology

## 2010-06-18 LAB — CBC WITH DIFFERENTIAL/PLATELET
BASO%: 0.2 % (ref 0.0–2.0)
Basophils Absolute: 0 10*3/uL (ref 0.0–0.1)
EOS%: 0.6 % (ref 0.0–7.0)
Eosinophils Absolute: 0.1 10*3/uL (ref 0.0–0.5)
HCT: 38.1 % (ref 34.8–46.6)
HGB: 12.1 g/dL (ref 11.6–15.9)
LYMPH%: 10.5 % — ABNORMAL LOW (ref 14.0–49.7)
MCH: 28.5 pg (ref 25.1–34.0)
MCHC: 31.8 g/dL (ref 31.5–36.0)
MCV: 89.6 fL (ref 79.5–101.0)
MONO#: 0.4 10*3/uL (ref 0.1–0.9)
MONO%: 4.5 % (ref 0.0–14.0)
NEUT#: 8.2 10*3/uL — ABNORMAL HIGH (ref 1.5–6.5)
NEUT%: 84.2 % — ABNORMAL HIGH (ref 38.4–76.8)
Platelets: 189 10*3/uL (ref 145–400)
RBC: 4.25 10*6/uL (ref 3.70–5.45)
RDW: 14.3 % (ref 11.2–14.5)
WBC: 9.7 10*3/uL (ref 3.9–10.3)
lymph#: 1 10*3/uL (ref 0.9–3.3)
nRBC: 0 % (ref 0–0)

## 2010-06-18 LAB — PROTIME-INR
INR: 2.9 (ref 2.00–3.50)
Protime: 34.8 Seconds — ABNORMAL HIGH (ref 10.6–13.4)

## 2010-06-19 ENCOUNTER — Telehealth: Payer: Self-pay | Admitting: Cardiology

## 2010-06-19 ENCOUNTER — Encounter: Payer: Self-pay | Admitting: Cardiology

## 2010-06-24 ENCOUNTER — Encounter: Payer: Self-pay | Admitting: Cardiology

## 2010-06-24 ENCOUNTER — Ambulatory Visit: Payer: Self-pay

## 2010-06-27 ENCOUNTER — Telehealth: Payer: Self-pay | Admitting: Cardiology

## 2010-07-09 ENCOUNTER — Telehealth: Payer: Self-pay | Admitting: Cardiology

## 2010-07-16 ENCOUNTER — Ambulatory Visit: Payer: Self-pay | Admitting: Cardiology

## 2010-07-16 LAB — PROTIME-INR
INR: 3.3 (ref 2.00–3.50)
Protime: 39.6 Seconds — ABNORMAL HIGH (ref 10.6–13.4)

## 2010-07-16 LAB — CBC WITH DIFFERENTIAL/PLATELET
BASO%: 0.4 % (ref 0.0–2.0)
Basophils Absolute: 0 10*3/uL (ref 0.0–0.1)
EOS%: 0.7 % (ref 0.0–7.0)
Eosinophils Absolute: 0.1 10*3/uL (ref 0.0–0.5)
HCT: 38 % (ref 34.8–46.6)
HGB: 12.6 g/dL (ref 11.6–15.9)
LYMPH%: 9.6 % — ABNORMAL LOW (ref 14.0–49.7)
MCH: 28.9 pg (ref 25.1–34.0)
MCHC: 33 g/dL (ref 31.5–36.0)
MCV: 87.5 fL (ref 79.5–101.0)
MONO#: 0.3 10*3/uL (ref 0.1–0.9)
MONO%: 2.6 % (ref 0.0–14.0)
NEUT#: 8.3 10*3/uL — ABNORMAL HIGH (ref 1.5–6.5)
NEUT%: 86.7 % — ABNORMAL HIGH (ref 38.4–76.8)
Platelets: 192 10*3/uL (ref 145–400)
RBC: 4.34 10*6/uL (ref 3.70–5.45)
RDW: 15.4 % — ABNORMAL HIGH (ref 11.2–14.5)
WBC: 9.6 10*3/uL (ref 3.9–10.3)
lymph#: 0.9 10*3/uL (ref 0.9–3.3)

## 2010-07-22 LAB — CONVERTED CEMR LAB
BUN: 23 mg/dL (ref 6–23)
CO2: 31 meq/L (ref 19–32)
Calcium: 9.2 mg/dL (ref 8.4–10.5)
Chloride: 99 meq/L (ref 96–112)
Creatinine, Ser: 1.2 mg/dL (ref 0.4–1.2)
GFR calc non Af Amer: 46.45 mL/min (ref >60)
Glucose, Bld: 116 mg/dL — ABNORMAL HIGH (ref 70–99)
Potassium: 4.3 meq/L (ref 3.5–5.1)
Sodium: 139 meq/L (ref 135–145)

## 2010-07-24 ENCOUNTER — Ambulatory Visit: Payer: Self-pay | Admitting: Oncology

## 2010-07-24 ENCOUNTER — Emergency Department (HOSPITAL_COMMUNITY): Admission: EM | Admit: 2010-07-24 | Discharge: 2010-07-25 | Payer: Self-pay | Admitting: Emergency Medicine

## 2010-07-24 LAB — CBC WITH DIFFERENTIAL/PLATELET
BASO%: 0.5 % (ref 0.0–2.0)
Eosinophils Absolute: 0.1 10*3/uL (ref 0.0–0.5)
LYMPH%: 33 % (ref 14.0–49.7)
MCHC: 32.8 g/dL (ref 31.5–36.0)
MCV: 87.7 fL (ref 79.5–101.0)
MONO#: 0.5 10*3/uL (ref 0.1–0.9)
MONO%: 5.8 % (ref 0.0–14.0)
NEUT#: 4.7 10*3/uL (ref 1.5–6.5)
Platelets: 210 10*3/uL (ref 145–400)
RBC: 4.33 10*6/uL (ref 3.70–5.45)
RDW: 15.6 % — ABNORMAL HIGH (ref 11.2–14.5)
WBC: 7.9 10*3/uL (ref 3.9–10.3)

## 2010-07-24 LAB — PROTIME-INR
INR: 2.1 (ref 2.00–3.50)
Protime: 25.2 Seconds — ABNORMAL HIGH (ref 10.6–13.4)

## 2010-07-26 ENCOUNTER — Telehealth: Payer: Self-pay | Admitting: Cardiology

## 2010-07-28 ENCOUNTER — Encounter: Payer: Self-pay | Admitting: Internal Medicine

## 2010-07-31 LAB — PROTIME-INR
INR: 1.8 — ABNORMAL LOW (ref 2.00–3.50)
Protime: 21.6 Seconds — ABNORMAL HIGH (ref 10.6–13.4)

## 2010-07-31 LAB — CBC WITH DIFFERENTIAL/PLATELET
BASO%: 0.3 % (ref 0.0–2.0)
Eosinophils Absolute: 0.1 10*3/uL (ref 0.0–0.5)
MCHC: 31.9 g/dL (ref 31.5–36.0)
MONO#: 0.9 10*3/uL (ref 0.1–0.9)
NEUT#: 8.6 10*3/uL — ABNORMAL HIGH (ref 1.5–6.5)
RBC: 3.84 10*6/uL (ref 3.70–5.45)
WBC: 10.9 10*3/uL — ABNORMAL HIGH (ref 3.9–10.3)
lymph#: 1.3 10*3/uL (ref 0.9–3.3)
nRBC: 0 % (ref 0–0)

## 2010-08-13 ENCOUNTER — Ambulatory Visit: Payer: Self-pay | Admitting: Cardiology

## 2010-08-13 LAB — CBC WITH DIFFERENTIAL/PLATELET
BASO%: 0.1 % (ref 0.0–2.0)
EOS%: 0.7 % (ref 0.0–7.0)
MCH: 29 pg (ref 25.1–34.0)
MCHC: 33 g/dL (ref 31.5–36.0)
NEUT%: 85.5 % — ABNORMAL HIGH (ref 38.4–76.8)
RBC: 3.64 10*6/uL — ABNORMAL LOW (ref 3.70–5.45)
RDW: 16.8 % — ABNORMAL HIGH (ref 11.2–14.5)
WBC: 7.3 10*3/uL (ref 3.9–10.3)
lymph#: 0.9 10*3/uL (ref 0.9–3.3)

## 2010-08-13 LAB — PROTIME-INR
INR: 2.6 (ref 2.00–3.50)
Protime: 31.2 Seconds — ABNORMAL HIGH (ref 10.6–13.4)

## 2010-08-21 ENCOUNTER — Telehealth: Payer: Self-pay | Admitting: Cardiology

## 2010-09-02 ENCOUNTER — Ambulatory Visit: Payer: Self-pay | Admitting: Cardiology

## 2010-09-02 DIAGNOSIS — I10 Essential (primary) hypertension: Secondary | ICD-10-CM | POA: Insufficient documentation

## 2010-09-09 LAB — CONVERTED CEMR LAB
BUN: 27 mg/dL — ABNORMAL HIGH (ref 6–23)
CO2: 31 meq/L (ref 19–32)
Calcium: 9.8 mg/dL (ref 8.4–10.5)
Chloride: 101 meq/L (ref 96–112)
Creatinine, Ser: 1.4 mg/dL — ABNORMAL HIGH (ref 0.4–1.2)
GFR calc non Af Amer: 41.7 mL/min — ABNORMAL LOW (ref >60.00)
Glucose, Bld: 117 mg/dL — ABNORMAL HIGH (ref 70–99)
Potassium: 4.3 meq/L (ref 3.5–5.1)
Sodium: 143 meq/L (ref 135–145)

## 2010-09-10 ENCOUNTER — Ambulatory Visit: Payer: Self-pay | Admitting: Oncology

## 2010-09-10 LAB — CBC WITH DIFFERENTIAL/PLATELET
BASO%: 0.3 % (ref 0.0–2.0)
Basophils Absolute: 0 10*3/uL (ref 0.0–0.1)
Eosinophils Absolute: 0 10*3/uL (ref 0.0–0.5)
HCT: 31.6 % — ABNORMAL LOW (ref 34.8–46.6)
HGB: 10.6 g/dL — ABNORMAL LOW (ref 11.6–15.9)
LYMPH%: 9.6 % — ABNORMAL LOW (ref 14.0–49.7)
MONO#: 0.1 10*3/uL (ref 0.1–0.9)
NEUT#: 5.6 10*3/uL (ref 1.5–6.5)
NEUT%: 88.1 % — ABNORMAL HIGH (ref 38.4–76.8)
Platelets: 190 10*3/uL (ref 145–400)
WBC: 6.3 10*3/uL (ref 3.9–10.3)
lymph#: 0.6 10*3/uL — ABNORMAL LOW (ref 0.9–3.3)

## 2010-09-10 LAB — PROTIME-INR

## 2010-10-08 LAB — CBC WITH DIFFERENTIAL/PLATELET
BASO%: 0.3 % (ref 0.0–2.0)
Basophils Absolute: 0 10*3/uL (ref 0.0–0.1)
EOS%: 2.6 % (ref 0.0–7.0)
Eosinophils Absolute: 0.2 10*3/uL (ref 0.0–0.5)
HCT: 35.4 % (ref 34.8–46.6)
HGB: 11.4 g/dL — ABNORMAL LOW (ref 11.6–15.9)
LYMPH%: 10.2 % — ABNORMAL LOW (ref 14.0–49.7)
MCH: 29.4 pg (ref 25.1–34.0)
MCHC: 32.2 g/dL (ref 31.5–36.0)
MCV: 91.2 fL (ref 79.5–101.0)
MONO#: 0.6 10*3/uL (ref 0.1–0.9)
MONO%: 8.2 % (ref 0.0–14.0)
NEUT#: 5.5 10*3/uL (ref 1.5–6.5)
NEUT%: 78.7 % — ABNORMAL HIGH (ref 38.4–76.8)
Platelets: 120 10*3/uL — ABNORMAL LOW (ref 145–400)
RBC: 3.88 10*6/uL (ref 3.70–5.45)
RDW: 17.2 % — ABNORMAL HIGH (ref 11.2–14.5)
WBC: 7 10*3/uL (ref 3.9–10.3)
lymph#: 0.7 10*3/uL — ABNORMAL LOW (ref 0.9–3.3)
nRBC: 0 % (ref 0–0)

## 2010-10-08 LAB — PROTIME-INR
INR: 2.5 (ref 2.00–3.50)
Protime: 30 Seconds — ABNORMAL HIGH (ref 10.6–13.4)

## 2010-10-15 ENCOUNTER — Encounter: Payer: Self-pay | Admitting: Internal Medicine

## 2010-10-17 ENCOUNTER — Encounter: Payer: Self-pay | Admitting: Internal Medicine

## 2010-10-17 ENCOUNTER — Ambulatory Visit
Admission: RE | Admit: 2010-10-17 | Discharge: 2010-10-17 | Payer: Self-pay | Source: Home / Self Care | Attending: Internal Medicine | Admitting: Internal Medicine

## 2010-10-17 DIAGNOSIS — J189 Pneumonia, unspecified organism: Secondary | ICD-10-CM | POA: Insufficient documentation

## 2010-10-17 DIAGNOSIS — R0902 Hypoxemia: Secondary | ICD-10-CM | POA: Insufficient documentation

## 2010-10-17 DIAGNOSIS — J441 Chronic obstructive pulmonary disease with (acute) exacerbation: Secondary | ICD-10-CM | POA: Insufficient documentation

## 2010-10-18 ENCOUNTER — Telehealth (INDEPENDENT_AMBULATORY_CARE_PROVIDER_SITE_OTHER): Payer: Self-pay | Admitting: *Deleted

## 2010-10-21 ENCOUNTER — Encounter: Payer: Self-pay | Admitting: Internal Medicine

## 2010-10-22 NOTE — Progress Notes (Signed)
Summary: problem with med  Phone Note Call from Patient   Caller: Patient Reason for Call: Talk to Nurse Summary of Call: pt's bp med was changed, however bp going up instead of down and her feet are swelling-pls [phone] Initial call taken by: Glynda Jaeger,  May 09, 2010 2:22 PM  Follow-up for Phone Call        Called patient back and she has advised me that her BP is staying at 150/80 to 150/90 and feet are now swelling since Dr.McLean made the BP medication changes at the last office visit ( off Triamterene/Hctz, on Chlorthalidone). Advised her that we would discuss BP and edema of feet tomorrow 8/19 with Dr. Shirlee Latch and call her back. She was fine with this plan and has advised that she will be at work all day tomorrow and we can leave a message on her office voice mail if she does not answer the phone. Follow-up by: J REISS RN     Appended Document: problem with med Surprising that feet are swelling more as chlorthalidone tends to be a stronger diuretic.  She can either stay on chlorthalidone or go back to HCTZ.  I think that she will need another agent, though (BP was high on HCTZ also), and would start lisinopril 10 mg daily with BMET in 2 wks.   Appended Document: problem with med Patient was called with this information on 05/10/2010.

## 2010-10-22 NOTE — Progress Notes (Signed)
Summary: pt wants to make med changes  Phone Note Call from Patient Call back at Work Phone [phone]   Caller: Patient Reason for Call: Talk to Nurse, Talk to Doctor Summary of Call: pt has gout and at last office visit Dr. Shirlee Latch told her some her meds causes gout flares and if she is in alot of pain to call the office and we will see what we can do, she wants to make some changes because she is a lot of pain. She will be at the number listed above until 5pm and she goes to lunch 12-1 Initial call taken by: Omer Jack,  June 12, 2010 9:39 AM  Follow-up for Phone Call        Changed from 50mg  of HCTZ to 25mg  and increased Lisinopril to 40mg  her other MD feels like its the HCTZ that is causing the flare up.  Her BP is still up around 140-150/high80's-low90's. She feels like BP up because in lots of pain all the time with the gout. Now the flare up is in both hands.  Just wants some direction as to what to do for BP that will not effect gout. Will ask Dr Shirlee Latch and call pt back Dennis Bast, RN, BSN  June 12, 2010 9:56 AM     Appended Document: pt wants to make med changes Stop HCTZ with gout pain.  For BP would try amlodipine 5 mg daily.   Appended Document: Amlodipine 5mg  spoke with Dr Shirlee Latch she is to stop Potassium also and check a BMP on 06/19/10 will change lab order from 9/23 to 9/28 at 8:30 Call in Rx's to [facility]

## 2010-10-22 NOTE — Assessment & Plan Note (Signed)
Summary: np6/ htn/ [facility] has [facility]   Primary Provider:  Georgette Shell, PA  CC:  new patient.  HTN/ .  History of Present Illness: 65 yo with history of HTN, hyperlipidemia, and antiphospholipid antibody syndrome presents for evaluation of HTN.  Patient has had elevated BP for a number of years and says that it has been difficult to control.  She has been under a lot of stress at work (doing the jobs of 2 people) and not getting as much exercise as she would like (has not been able to lose weight).  She can walk on flat ground without shortness of breath, but she gets a little winded with steps.  No chest pain or tightness.   BP is 174/98 this am and she took all her meds.  However, she does tell me that she was stuck in an elevator at work this morning which got her quite anxious.  At home, BP runs from 120s-140s/70s-100s when she checks it.  No daytime sleepiness or morning headache.   Patient is on warfarin given antiphospholipid antibody syndrome with a history of thalamic stroke.   Labs (5/11): K 3.9, creatinine 1.2, HDL 68, LDL 79  Current Medications (verified): 1)  Levothroid 75 Mcg Tabs (Levothyroxine Sodium) .... Once Daily 2)  Metoprolol Succinate 50 Mg Xr24h-Tab (Metoprolol Succinate) .... Take One Tablet in The Morning 3)  Metoprolol Succinate 100 Mg Xr24h-Tab (Metoprolol Succinate) .... Take One Tablet in The Evening 4)  Triamterene-Hctz 75-50 Mg Tabs (Triamterene-Hctz) .... Take One Tablet Once Daily 5)  Micardis 80 Mg Tabs (Telmisartan) .... Take One Tablet Once Daily 6)  Zocor 40 Mg Tabs (Simvastatin) .... Take One Tablet Once Daily 7)  Probenecid 500 Mg Tabs (Probenecid) .... Take One Tablet Four Times Daily 8)  Prednisone 5 Mg Tabs (Prednisone) .... Once Daily As Needed For Gout Flare. Reduce To 1/2 Tablet 9)  Tramadol Hcl 50 Mg Tabs (Tramadol Hcl) .... One Tablet As Needed 10)  Aspirin 81 Mg Tabs (Aspirin) .... Once Daily 11)  Warfarin Sodium 5 Mg Tabs (Warfarin  Sodium) .... Take One and 1/2 Tablet Every Day 12)  Fish Oil 1000 Mg Caps (Omega-3 Fatty Acids) .... 2 Capsules Daily  Allergies (verified): 1)  ! Allopurinol 2)  ! Macrobid 3)  ! Keflex 4)  ! Sulfa  Past History:  Past Medical History: 1. Hyperlipidemia 2. HTN 3. Hypothyroidism 4. History of UTIs 5. 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  Followed by Dr. Darnelle Catalan.  On permanent warfarin. 6. Obesity 7. 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 Echo (11/05): EF 65%, normal LV size, aortic sclerosis.  10. Low back pain  Family History: Mother with "enlarged heart," probably died of MI at age 65.  Father passed away young from cancer.   Social History: Works in Scientist, physiological.  Prior smoker, quit 2000.  Separated from husband.   Review of Systems       All systems reviewed and negative except as per HPI.   Vital Signs:  Patient profile:   65 year old female Height:      63.5 inches Weight:      195 pounds BMI:     34.12 Pulse rate:   98 / minute Pulse rhythm:   irregular BP sitting:   174 / 98  (left arm) Cuff size:   large  Vitals Entered By: Judithe Modest CMA (April 29, 2010 9:14 AM)   Physical Exam  General:  Well developed, well nourished, in no acute distress. Obese.  Head:  normocephalic and atraumatic Nose:  no deformity, discharge, inflammation, or lesions Mouth:  Teeth, gums and palate normal. Oral mucosa normal. Neck:  Neck supple, no JVD. No masses, thyromegaly or abnormal cervical nodes. Lungs:  Clear bilaterally to auscultation and percussion. Heart:  Non-displaced PMI, chest non-tender; regular rate and rhythm, S1, S2 without rubs or gallops. 2/6 early systolic murmur at RUSB.  Carotid upstroke normal, no bruit.  Pedals normal pulses. No edema, no varicosities. Abdomen:  Bowel sounds positive; abdomen soft and non-tender without masses, organomegaly, or hernias noted. No  hepatosplenomegaly. Extremities:  No clubbing or cyanosis. Neurologic:  Alert and oriented x 3. Skin:  Intact without lesions or rashes. Psych:  Normal affect.   Impression & Recommendations:  Problem # 1:  ESSENTIAL HYPERTENSION (ICD-401.9) BP is high today, possibly in part due to being stuck in an elevator and coming in to see a new doctor.  However, it does appear to run high at times on her home readings.  I will have her stop triamterene/HCTZ.  I will start chlorthalidone 25 mg daily + KCl 20 mEq daily and will get a BMET and BP check in 2 wks.  She will return in one month.  If BP is still high, would add amlodipine.   Problem # 2:  CARDIAC MURMUR (ICD-785.2) Patient has an aortic-area murmur.  Antiphospholipid antibody syndrome can lead to valvular abnormalities.  I will get an echocardiogram to assess her valves and look for LV hypertrophy given long-standing HTN.   Other Orders: Echocardiogram (Echo)  Patient Instructions: 1)  Your physician has recommended you make the following change in your medication:  2)  Stop Triamterene/HCT 3)  Start Chlorthalidone 25mg  daily. 4)  Start KCL(potassium) 20 mEq daily. 5)  Lab in 2 weeks---BMP 401.9 785.2 6)  Your physician has requested that you regularly monitor and record your blood pressure readings at home.  Please use the same machine at the same time of day to check your readings.I will call you in 2 weeks to get the readings. Luana Shu [phone]  7)  Your physician recommends that you schedule a follow-up appointment in: 1 month with Dr Shirlee Latch. 8)  Your physician has requested that you have an echocardiogram.  Echocardiography is a painless test that uses sound waves to create images of your heart. It provides your doctor with information about the size and shape of your heart and how well your heart's chambers and valves are working.  This procedure takes approximately one hour. There are no restrictions for this  procedure. Prescriptions: KLOR-CON M20 20 MEQ CR-TABS (POTASSIUM CHLORIDE CRYS CR) one tablet daily  #30 x 6   Entered by:   Katina Dung, RN, BSN   Authorized by:   Marca Ancona, MD   Signed by:   Katina Dung, RN, BSN on 04/29/2010   Method used:   Electronically to        [id number] (retail)       [address]       [place], [place]  [address]       Ph: [phone] or [phone]       Fax: [phone]   RxID:   [id number] CHLORTHALIDONE 25 MG TABS (CHLORTHALIDONE) one daily  #30 x 6   Entered by:   Katina Dung, RN, BSN   Authorized by:  Marca Ancona, MD   Signed by:   Katina Dung, RN, BSN on 04/29/2010   Method used:   Electronically to        [id number] (retail)       [address]       [place], [place]  [address]       Ph: [phone] or [phone]       Fax: [phone]   RxID:   [id number]

## 2010-10-22 NOTE — Letter (Signed)
[facility]   Imported By: Lester Oglesby 03/04/2010 10:20:09  _____________________________________________________________________  External Attachment:    Type:   Image     Comment:   External Document

## 2010-10-22 NOTE — Assessment & Plan Note (Signed)
Summary: rov per pt call/rsc from 11/2/lg   Primary Provider:  Georgette Shell, PA   History of Present Illness: 65 yo with history of HTN, hyperlipidemia, and antiphospholipid antibody syndrome presented initially for evaluation of HTN.  Patient has had elevated BP for a number of years and says that it has been difficult to control.  She has been under a lot of stress at work (doing the jobs of 2 people) and not getting as much exercise as she would like (has not been able to lose weight).  She can walk on flat ground without shortness of breath, but she gets a little winded with steps.  No chest pain or tightness.    Renal artery ultrasound in 10/11 was normal with no evidence for renal artery stenosis.  BP is 162/78 today, down from 192/110 at last appointment.  She says SBP has been < 140 when she checks it at home.  In the interim, patient has been diagnosed with rheumatoid arthritis and started on methotrexate.  She is having less pain since starting treatment.    Labs (5/11): K 3.9, creatinine 1.2, HDL 68, LDL 79 Labs (9/11): K 3.1, creatinine 1.3 Labs (10/11): K 4.3, creatinine 1.2  ECG: NSR, RBBB  Current Medications (verified): 1)  Levothroid 75 Mcg Tabs (Levothyroxine Sodium) .... Once Daily 2)  Metoprolol Succinate 50 Mg Xr24h-Tab (Metoprolol Succinate) .... Take One Tablet in The Morning 3)  Metoprolol Succinate 100 Mg Xr24h-Tab (Metoprolol Succinate) .... Take One Tablet in The Evening 4)  Micardis 80 Mg Tabs (Telmisartan) .... Take One Tablet Once Daily 5)  Zocor 40 Mg Tabs (Simvastatin) .... Take One Tablet Once Daily 6)  Probenecid 500 Mg Tabs (Probenecid) .... 2 By Mouth Daily 7)  Prednisone 5 Mg Tabs (Prednisone) .... Once Daily As Needed For Gout Flare. Reduce To 1/2 Tablet 8)  Tramadol Hcl 50 Mg Tabs (Tramadol Hcl) .... One Tablet As Needed 9)  Aspirin 81 Mg Tabs (Aspirin) .... Once Daily 10)  Warfarin Sodium 5 Mg Tabs (Warfarin Sodium) .... Take One and 1/2 Tablet Every  Day 11)  Fish Oil 1000 Mg Caps (Omega-3 Fatty Acids) .... 2 Capsules Daily 12)  Lisinopril 40 Mg Tabs (Lisinopril) .... One By Mouth Daily 13)  Amlodipine Besylate 5 Mg Tabs (Amlodipine Besylate) .... One By Mouth Daily 14)  Hydrochlorothiazide 50 Mg Tabs (Hydrochlorothiazide) .... One Daily 15)  Methotrexate 2.5 Mg Tabs (Methotrexate Sodium) .... 4 By Mouth Every Thur and Friq  Allergies (verified): 1)  ! Allopurinol 2)  ! Macrobid 3)  ! Keflex 4)  ! Sulfa  Past History:  Past Medical History: 1. Hyperlipidemia 2. HTN: renal artery ultrasound (10/11) with no renal artery stenosis.  3. Hypothyroidism 4. History of UTIs 5. 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  Followed by Dr. Darnelle Catalan.  On permanent warfarin. 6. Obesity 7. Gout: questionable.  Uric acid normal when checked in 10/11.  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 Echo (8/11): moderate focal basal septal hypertrophy, EF 55-60%, no LV outflow tract gradient, grade I diastolic dysfunction, no regional wall motion abnormalities, no systolic anteiror motion of the mitral valve, PA systolic pressure 30 mmHg.  10. Low back pain 11. Rheumatoid arthritis on MTX  Family History: Reviewed history from 04/29/2010 and no changes required. Mother with "enlarged heart," probably died of MI at age 55.  Father passed away young from cancer.   Social History: Reviewed history from 06/04/2010 and no changes required. Works in  accounts payable department.  Prior smoker, quit 2000.  Separated from husband. Has children.   Vital Signs:  Patient profile:   65 year old female Height:      63.5 inches Weight:      197 pounds BMI:     34.47 Pulse rate:   91 / minute Resp:     16 per minute BP sitting:   162 / 78  (right arm)  Vitals Entered By: Marrion Coy, CNA (August 13, 2010 8:25 AM)  Physical Exam  General:  Well developed, well nourished, in no acute distress. Obese.   Neck:  Neck thick, no JVD. No masses, thyromegaly or abnormal cervical nodes. Lungs:  Clear bilaterally to auscultation and percussion. Heart:  Non-displaced PMI, chest non-tender; regular rate and rhythm, S1, S2 without rubs or gallops. 2/6 early systolic murmur at RUSB.  Carotid upstroke normal, no bruit.  Pedals normal pulses. No edema, no varicosities. Abdomen:  Bowel sounds positive; abdomen soft and non-tender without masses, organomegaly, or hernias noted. No hepatosplenomegaly. Extremities:  No clubbing or cyanosis. Neurologic:  Alert and oriented x 3. Psych:  Normal affect.   Impression & Recommendations:  Problem # 1:  ESSENTIAL HYPERTENSION (ICD-401.9) BP still high in the office but she has been checking almost daily at home, with all readings < 140.  Will not change therapy at this time.   Problem # 2:  CARDIAC MURMUR (ICD-785.2) Patient has moderate focal basal septal hypertrophy without significant LV outflow tract gradient or systolic anterior motion of the mitral valve. The murmur is likely due to some flow acceleration with turbulence in the LV outflor tract.  I suspect this finding is due to long-term HTN rather than a variant of hypertrophic CMP.   Continue Toprol XL to decrease risk of significant LV outflow tract obstruction.   Patient Instructions: 1)  Your physician recommends that you schedule a follow-up appointment in: 1 year 2)  Your physician recommends that you continue on your current medications as directed. Please refer to the Current Medication list given to you today. Prescriptions: HYDROCHLOROTHIAZIDE 50 MG TABS (HYDROCHLOROTHIAZIDE) one daily  #90 x 3   Entered by:   Katina Dung, RN, BSN   Authorized by:   Marca Ancona, MD   Signed by:   Katina Dung, RN, BSN on 08/13/2010   Method used:   Print then Give to Patient   RxID:   [id number] AMLODIPINE BESYLATE 5 MG TABS (AMLODIPINE BESYLATE) one by mouth daily  #90 x 3   Entered by:   Katina Dung, RN, BSN   Authorized by:   Marca Ancona, MD   Signed by:   Katina Dung, RN, BSN on 08/13/2010   Method used:   Print then Give to Patient   RxID:   [id number] LISINOPRIL 40 MG TABS (LISINOPRIL) one by mouth daily  #90 x 3   Entered by:   Katina Dung, RN, BSN   Authorized by:   Marca Ancona, MD   Signed by:   Katina Dung, RN, BSN on 08/13/2010   Method used:   Print then Give to Patient   RxID:   [id number]

## 2010-10-22 NOTE — Letter (Signed)
Summary: Outpatient Coins[facility] Notice  Outpatient Coins[facility] Notice   Imported By: Marylou Mccoy 05/24/2010 09:52:52  _____________________________________________________________________  External Attachment:    Type:   Image     Comment:   External Document

## 2010-10-22 NOTE — Assessment & Plan Note (Signed)
Summary: 1 MONTH ROV/SL   Primary Provider:  Georgette Shell, PA  CC:  1 month rov.  Pt states her bp has been going up and down.  She is having a gout flare in her left wrist..  History of Present Illness: 65 yo with history of HTN, hyperlipidemia, and antiphospholipid antibody syndrome presented initially for evaluation of HTN.  Patient has had elevated BP for a number of years and says that it has been difficult to control.  She has been under a lot of stress at work (doing the jobs of 2 people) and not getting as much exercise as she would like (has not been able to lose weight).  She can walk on flat ground without shortness of breath, but she gets a little winded with steps.  No chest pain or tightness.    Today, BP is 192/110.  Patient took all her meds.  She is in a lot of pain today from gout flare in her wrist.  Prior to the gout flare, her BP was running 120-150/70s-80s at home.  She cannot tolerate allopurinol and Uloric is very expensive so she is on no preventive medicine for the gout.  She says that after going on chlorthalidone, her ankle swelling increased considerably, so she went back only HCTZ with resolution of the swelling.    Patient denies daytime sleepiness or morning headache.  She is not sure if she snores loudly or stops breathing.   Patient is on warfarin given antiphospholipid antibody syndrome with a history of thalamic stroke.   Recent echo showed moderate focal basal septal hypertrophy wiht normal EF and no LV outflow tract gradient.   Labs (5/11): K 3.9, creatinine 1.2, HDL 68, LDL 79 Labs (9/11): K 3.1, creatinine 1.3  Current Medications (verified): 1)  Levothroid 75 Mcg Tabs (Levothyroxine Sodium) .... Once Daily 2)  Metoprolol Succinate 50 Mg Xr24h-Tab (Metoprolol Succinate) .... Take One Tablet in The Morning 3)  Metoprolol Succinate 100 Mg Xr24h-Tab (Metoprolol Succinate) .... Take One Tablet in The Evening 4)  Micardis 80 Mg Tabs (Telmisartan) .... Take  One Tablet Once Daily 5)  Zocor 40 Mg Tabs (Simvastatin) .... Take One Tablet Once Daily 6)  Probenecid 500 Mg Tabs (Probenecid) .... Take One Tablet Four Times Daily 7)  Prednisone 5 Mg Tabs (Prednisone) .... Once Daily As Needed For Gout Flare. Reduce To 1/2 Tablet 8)  Tramadol Hcl 50 Mg Tabs (Tramadol Hcl) .... One Tablet As Needed 9)  Aspirin 81 Mg Tabs (Aspirin) .... Once Daily 10)  Warfarin Sodium 5 Mg Tabs (Warfarin Sodium) .... Take One and 1/2 Tablet Every Day 11)  Fish Oil 1000 Mg Caps (Omega-3 Fatty Acids) .... 2 Capsules Daily 12)  Klor-Con M20 20 Meq Cr-Tabs (Potassium Chloride Crys Cr) .... Two in The Morning and One in The Evening 13)  Hydrochlorothiazide 50 Mg Tabs (Hydrochlorothiazide) .Marland Kitchen.. 1 Once Daily 14)  Lisinopril 20 Mg Tabs (Lisinopril) .... One Daily  Allergies (verified): 1)  ! Allopurinol 2)  ! Macrobid 3)  ! Keflex 4)  ! Sulfa  Past History:  Past Medical History: 1. Hyperlipidemia 2. HTN 3. Hypothyroidism 4. History of UTIs 5. 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  Followed by Dr. Darnelle Catalan.  On permanent warfarin. 6. Obesity 7. 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 Echo (8/11): moderate focal basal septal hypertrophy, EF 55-60%, no LV outflow tract gradient, grade I diastolic dysfunction, no regional wall motion abnormalities, no systolic anteiror motion of  the mitral valve, PA systolic pressure 30 mmHg.  10. Low back pain  Family History: Reviewed history from 04/29/2010 and no changes required. Mother with "enlarged heart," probably died of MI at age 26.  Father passed away young from cancer.   Social History: Works in Scientist, physiological.  Prior smoker, quit 2000.  Separated from husband. Has children.   Review of Systems       All systems reviewed and negative except as per HPI.   Vital Signs:  Patient profile:   65 year old female Height:      63.5 inches Weight:       197 pounds BMI:     34.47 Pulse rate:   99 / minute Pulse rhythm:   regular BP sitting:   192 / 110  (left arm) Cuff size:   large  Vitals Entered By: Judithe Modest CMA (June 04, 2010 8:33 AM)  Physical Exam  General:  Well developed, well nourished, in no acute distress. Obese.  Neck:  Neck thick, no JVD. No masses, thyromegaly or abnormal cervical nodes. Lungs:  Clear bilaterally to auscultation and percussion. Heart:  Non-displaced PMI, chest non-tender; regular rate and rhythm, S1, S2 without rubs or gallops. 2/6 early systolic murmur at RUSB.  Carotid upstroke normal, no bruit.  Pedals normal pulses. No edema, no varicosities. Abdomen:  Bowel sounds positive; abdomen soft and non-tender without masses, organomegaly, or hernias noted. No hepatosplenomegaly. Extremities:  No clubbing or cyanosis. Neurologic:  Alert and oriented x 3. Psych:  Normal affect.   Impression & Recommendations:  Problem # 1:  CARDIAC MURMUR (ICD-785.2) Patient has moderate focal basal septal hypertrophy without significant LV outflow tract gradient or systolic anterior motion of the mitral valve. The murmur is likely due to some flow acceleration with turbulence in the LV outflor tract.  I suspect this finding is due to long-term HTN rather than a variant of hypertrophic CMP.   Continue Toprol XL to decrease risk of significant LV outflow tract obstruction.   Problem # 2:  ESSENTIAL HYPERTENSION (ICD-401.9) BP is quite high today but patient is in a lot of pain from gout.  Prior to the gout flare, BP had been reasonable controlled.  She has had more frequent gout flares recently.  She is unable to take allopurinol (intolerant) and Uloric is too expensive.  Certainly the use of a diuretic BP medication can lead to increase in uric acid level and predispose to gout flare.  I wil have her cut back on HCTZ to 25 mg daily from 50 mg daily and will increase lisinopril to 40 mg daily.  I will also decrease  KCl to 40 mEq daily.  She will need to get a BMET in 1 week, and we will call her in 2 wks to see how her BP is doing.  Additionally, I am going to set her up for renal artery dopplers.  She does not screen definite positive for OSA.  She will ask one of her sons to watch her sleep one night to see if she snores loudly or gasps in her sleep.   Other Orders: Renal Artery Duplex (Renal Artery Duplex)  Patient Instructions: 1)  Your physician recommends that you schedule a follow-up appointment in: 2 MONTHS WITH DR [facility] 2)  [name] WILL CALL IN 2 WEEKS TO CHECK ON B/P 3)  Your physician recommends that you return for lab work VH:QION 401.1 V58.69 4)  Your physician has recommended you make the following change  in your medication: DECREASE HCTZ TO 25 MG once daily 5)  INCREASE LISINOPRIL40 MG 1 once daily 6)  DECREASE POTASSIUM TO 40 MEQ once daily 7)  Your physician has requested that you have a renal artery duplex. During this test, an ultrasound is used to evaluate blood flow to the kidneys. Allow one hour for this exam. Do not eat after midnight the day before and avoid carbonated beverages. Take your medications as you usually do.

## 2010-10-22 NOTE — Progress Notes (Signed)
Summary: should pt continue to take k+ or not.  Phone Note Call from Patient Call back at Home Phone [phone]   Caller: Patient Reason for Call: Talk to Nurse Summary of Call: per pt calling, should pt continue taken k+ pills.  Initial call taken by: Lorne Skeens,  July 26, 2010 10:03 AM  Follow-up for Phone Call        spoke w/pt she states she had started back taking potassium 2 tabs daily and wanted to know if she should continue, advised her potassium level was at a good point adn if she stopped the potassium level would drop so she needs to cont. she is agreeable.  She also wanted Dr Shirlee Latch to know that she missed her appt on Tue b/c she woke up w/a nose bleed and ended up in ER to get it stopped, its all better know she is sch to see him on 11/22 Meredith Staggers, RN  July 26, 2010 10:24 AM

## 2010-10-22 NOTE — Progress Notes (Signed)
Summary: B/P readings  Phone Note Outgoing Call   Call placed by: Katina Dung, RN, BSN,  May 15, 2010 8:52 AM Call placed to: Patient Summary of Call: B/P readings  Follow-up for Phone Call        Chlorthalidone 25mg  started 04/29/10--call and get B/P readings--LMTCB Katina Dung, RN, BSN  May 15, 2010 11:34 AM   voice mail Katina Dung, RN, BSN  May 15, 2010 3:22 PM voice mail Katina Dung, RN, BSN  May 15, 2010 5:56 PM   Pt returning call [phone] call back after 10:00 Judie Grieve  May 16, 2010 8:18 AM  Additional Follow-up for Phone Call Additional follow up Details #1::        [facility] Katina Dung, RN, BSN  May 16, 2010 5:35 PM  pt called back on 05/10/10 with edema--at that time chlorthalidone stopped and HCTZ 50mg  and Lisinopril 10mg  started--pt states edema has resolved -she did not have B/P readings with her at work today but she did think her B/P was gradually improving --med changes made 1 week ago--pt will continue to take and record her B/P and will followup with me next Thursday with B/P readings  pt scheduled for BMP 05/29/10 Katina Dung, RN, BSN  May 17, 2010 8:36 AM

## 2010-10-22 NOTE — Progress Notes (Signed)
Summary: B/P readings--review 05/23/10  Phone Note Call from Patient   Caller: Patient Reason for Call: Talk to Nurse Summary of Call: pt was to call anne today with 2 weeks of bp readings-pls call [phone] or after [phone] Initial call taken by: Glynda Jaeger,  May 23, 2010 12:22 PM  Follow-up for Phone Call        talked with pt recent B/P readings 05/11/10 160/90 05/12/10 161/85 05/14/10 139/93 05/15/10 130/78 8/10/27/09 158/93 05/17/10 144/93 82/7/11 134/94 05/19/10 140/97 05/20/10 144/97 05/21/10 148/97 05/22/10 148/93 05/23/10 146/93   HCTZ 50mg  and Lisinopril 10mg   started 05/11/10 will forward to Dr Shirlee Latch for review     Appended Document: B/P readings--review 05/23/10 Increase lisinopril to 20 mg daily and get BMET in 2 wks.   Appended Document: B/P readings--review 05/23/10 discussed with pt by telephone--Karen Chambers will increase Lisinopril to 20mg  daily,return for BMP 05/31/10, and see Dr Shirlee Latch 06/04/10--Karen Chambers will continue to take and record her B/P and  bring readings to her appointment with Dr Shirlee Latch 06/04/10   Clinical Lists Changes  Medications: Changed medication from LISINOPRIL 10 MG TABS (LISINOPRIL) 1 once daily to LISINOPRIL 20 MG TABS (LISINOPRIL) one daily - Signed Rx of LISINOPRIL 20 MG TABS (LISINOPRIL) one daily;  #30 x 6;  Signed;  Entered by: Katina Dung, RN, BSN;  Authorized by: Marca Ancona, MD;  Method used: Electronically to [facility] [address] # [id number]*, [address] [place], [place], [place]  [address], Ph: [phone] or [phone], Fax: [phone] Observations: Added new observation of MEDRECON: current updated (05/24/2010 12:18)    Prescriptions: LISINOPRIL 20 MG TABS (LISINOPRIL) one daily  #30 x 6   Entered by:   Katina Dung, RN, BSN   Authorized by:   Marca Ancona, MD   Signed by:   Katina Dung, RN, BSN on 05/24/2010   Method used:   Electronically to        [facility]. # [id number]* (retail)       [address]       [place], [place]  [address]       Ph: [phone] or [phone]       Fax: [phone]   RxID:   [id number]     Current Medications (verified): 1)  Levothroid 75 Mcg Tabs (Levothyroxine Sodium) .... Once Daily 2)  Metoprolol Succinate 50 Mg Xr24h-Tab (Metoprolol Succinate) .... Take One Tablet in The Morning 3)  Metoprolol Succinate 100 Mg Xr24h-Tab (Metoprolol Succinate) .... Take One Tablet in The Evening 4)  Micardis 80 Mg Tabs (Telmisartan) .... Take One Tablet Once Daily 5)  Zocor 40 Mg Tabs (Simvastatin) .... Take One Tablet Once Daily 6)  Probenecid 500 Mg Tabs (Probenecid) .... Take One Tablet Four Times Daily 7)  Prednisone 5 Mg Tabs (Prednisone) .... Once Daily As Needed For Gout Flare. Reduce To 1/2 Tablet 8)  Tramadol Hcl 50 Mg Tabs (Tramadol Hcl) .... One Tablet As Needed 9)  Aspirin 81 Mg Tabs (Aspirin) .... Once Daily 10)  Warfarin Sodium 5 Mg Tabs (Warfarin Sodium) .... Take One and 1/2 Tablet Every Day 11)  Fish Oil 1000 Mg Caps (Omega-3 Fatty Acids) .... 2 Capsules Daily 12)  Klor-Con M20 20 Meq Cr-Tabs (Potassium Chloride Crys Cr) .... One Tablet Daily 13)  Hydrochlorothiazide 50 Mg Tabs (Hydrochlorothiazide) .Marland Kitchen.. 1 Once Daily 14)  Lisinopril 20 Mg Tabs (Lisinopril) .... One Daily  Allergies: 1)  ! Allopurinol 2)  !  Macrobid 3)  ! Keflex 4)  ! Sulfa

## 2010-10-22 NOTE — Progress Notes (Signed)
Summary: B/P readings  Phone Note Outgoing Call   Call placed by: Katina Dung, RN, BSN,  June 27, 2010 8:30 AM Call placed to: Patient Summary of Call: B/P readings  Follow-up for Phone Call        recent B/P readings: 06/20/10  165/90 06/21/10 144/94 06/22/10  152/88 06/23/10  136/80 06/24/10  143/89 06/25/10  149/89 06/26/10 158/89   pt states she will begin Methotrexate this Friday for RA--Dr Kellie Simmering did tell pt it should be OK to begin diuretics for B/P if this was Dr Alford Highland recommendation--she has HCTZ 50mg  at home that she has taken in the past--I will forward to Dr Shirlee Latch for review         Appended Document: B/P readings OK to restart HCTZ 50 mg daily.   Appended Document: B/P readings talked with pt --she will restart HCTZ 50mg  daily--she will continue to take and record her B/P   Clinical Lists Changes  Medications: Removed medication of FUROSEMIDE 20 MG TABS (FUROSEMIDE) Take one tablet by mouth daily. Added new medication of HYDROCHLOROTHIAZIDE 50 MG TABS (HYDROCHLOROTHIAZIDE) one daily Observations: Added new observation of MEDRECON: current updated (06/27/2010 12:54)       Current Medications (verified): 1)  Levothroid 75 Mcg Tabs (Levothyroxine Sodium) .... Once Daily 2)  Metoprolol Succinate 50 Mg Xr24h-Tab (Metoprolol Succinate) .... Take One Tablet in The Morning 3)  Metoprolol Succinate 100 Mg Xr24h-Tab (Metoprolol Succinate) .... Take One Tablet in The Evening 4)  Micardis 80 Mg Tabs (Telmisartan) .... Take One Tablet Once Daily 5)  Zocor 40 Mg Tabs (Simvastatin) .... Take One Tablet Once Daily 6)  Probenecid 500 Mg Tabs (Probenecid) .... Take One Tablet Four Times Daily 7)  Prednisone 5 Mg Tabs (Prednisone) .... Once Daily As Needed For Gout Flare. Reduce To 1/2 Tablet 8)  Tramadol Hcl 50 Mg Tabs (Tramadol Hcl) .... One Tablet As Needed 9)  Aspirin 81 Mg Tabs (Aspirin) .... Once Daily 10)  Warfarin Sodium 5 Mg Tabs (Warfarin Sodium) ....  Take One and 1/2 Tablet Every Day 11)  Fish Oil 1000 Mg Caps (Omega-3 Fatty Acids) .... 2 Capsules Daily 12)  Lisinopril 40 Mg Tabs (Lisinopril) .... One By Mouth Daily 13)  Amlodipine Besylate 5 Mg Tabs (Amlodipine Besylate) .... One By Mouth Daily 14)  Hydrochlorothiazide 50 Mg Tabs (Hydrochlorothiazide) .... One Daily  Allergies: 1)  ! Allopurinol 2)  ! Macrobid 3)  ! Keflex 4)  ! Sulfa

## 2010-10-22 NOTE — Letter (Signed)
Summary: Previsit letter  [facility]  [address] [place], [place] [address]   Phone: [phone]  Fax: [phone]       02/21/2010 MRN: [id number]  Karen Chambers [address] [place], [place]  [address]  Dear Ms. Burmaster,  Welcome to the Gastroenterology Division at [facility].    You are scheduled to see a nurse for your pre-procedure visit on March 18, 2010 at 11:00am on the 3rd floor at [facility], [address]  We ask that you try to arrive at our office 15 minutes prior to your appointment time to allow for check-in.  Your nurse visit will consist of discussing your medical and surgical history, your immediate family medical history, and your medications.    Please bring a complete list of all your medications or, if you prefer, bring the medication bottles and we will list them.  We will need to be aware of both prescribed and over the counter drugs.  We will need to know exact dosage information as well.  If you are on blood thinners (Coumadin, Plavix, Aggrenox, Ticlid, etc.) please call our office today/prior to your appointment, as we need to consult with your physician about holding your medication.   Please be prepared to read and sign documents such as consent forms, a financial agreement, and acknowledgement forms.  If necessary, and with your consent, a friend or relative is welcome to sit-in on the nurse visit with you.  Please bring your insurance card so that we may make a copy of it.  If your insurance requires a referral to see a specialist, please bring your referral form from your primary care physician.  No co-pay is required for this nurse visit.     If you cannot keep your appointment, please call [phone] to cancel or reschedule prior to your appointment date.  This allows [place] the opportunity to schedule an appointment for another patient in need of care.    Thank you for choosing [facility] for your medical  needs.  We appreciate the opportunity to care for you.  Please visit [place] at our website  to learn more about our practice.                     Sincerely.                                                                                                                   The Gastroenterology Division

## 2010-10-22 NOTE — Progress Notes (Signed)
Summary: question on meds  Phone Note Call from Patient Call back at Work Phone [phone]   Caller: Patient Reason for Call: Talk to Nurse Summary of Call: pt would like to know if she needs to take potassium.  Initial call taken by: Roe Coombs,  August 21, 2010 11:29 AM  Follow-up for Phone Call        [facility] Katina Dung, RN, BSN  August 21, 2010 11:44 AM     New/Updated Medications: KLOR-CON M20 20 MEQ CR-TABS (POTASSIUM CHLORIDE CRYS CR) one daily   Current Medications (verified): 1)  Levothroid 75 Mcg Tabs (Levothyroxine Sodium) .... Once Daily 2)  Metoprolol Succinate 50 Mg Xr24h-Tab (Metoprolol Succinate) .... Take One Tablet in The Morning 3)  Metoprolol Succinate 100 Mg Xr24h-Tab (Metoprolol Succinate) .... Take One Tablet in The Evening 4)  Micardis 80 Mg Tabs (Telmisartan) .... Take One Tablet Once Daily 5)  Zocor 40 Mg Tabs (Simvastatin) .... Take One Tablet Once Daily 6)  Probenecid 500 Mg Tabs (Probenecid) .... 2 By Mouth Daily 7)  Prednisone 5 Mg Tabs (Prednisone) .... Once Daily As Needed For Gout Flare. Reduce To 1/2 Tablet 8)  Tramadol Hcl 50 Mg Tabs (Tramadol Hcl) .... One Tablet As Needed 9)  Aspirin 81 Mg Tabs (Aspirin) .... Once Daily 10)  Warfarin Sodium 5 Mg Tabs (Warfarin Sodium) .... Take One and 1/2 Tablet Every Day 11)  Fish Oil 1000 Mg Caps (Omega-3 Fatty Acids) .... 2 Capsules Daily 12)  Lisinopril 40 Mg Tabs (Lisinopril) .... One By Mouth Daily 13)  Amlodipine Besylate 5 Mg Tabs (Amlodipine Besylate) .... One By Mouth Daily 14)  Hydrochlorothiazide 50 Mg Tabs (Hydrochlorothiazide) .... One Daily 15)  Methotrexate 2.5 Mg Tabs (Methotrexate Sodium) .... 4 By Mouth Every Thur and Friq 16)  Klor-Con M20 20 Meq Cr-Tabs (Potassium Chloride Crys Cr) .... One Daily  Allergies: 1)  ! Allopurinol 2)  ! Macrobid 3)  ! Keflex 4)  ! Sulfa pt currently taking KCL 40 mEq daily--she wants to decrease to 20 mEq daily(she wants to take as little  medication as possible) --she will decrease KCL to 20 mEq daily and return for [facility] 09/02/10--I reviewed this with Dr Shirlee Latch

## 2010-10-22 NOTE — Progress Notes (Signed)
Summary: question re med  Phone Note Call from Patient   Caller: Patient [phone] Reason for Call: Talk to Nurse Summary of Call: pt calling re med-took her off potassim and wants to know if she will go back on it Initial call taken by: Glynda Jaeger,  July 09, 2010 9:54 AM  Follow-up for Phone Call        Spoke with pt. Patient states she was take off Furosemide and potassium a while ego due to her gout. About 8 to 9 days ago she was put back on diuretics, ( HCTZ) but not on potassium. I let pt. know taken HCTZ she does not lose her potassium in the urine, and she may not need the potassium suplement. Ollen Gross, RN, BSN  July 09, 2010 12:08 PM      Appended Document: question re med She should go back on the potassium dose that she was taking prior and BMET in 1 week.   Appended Document: question re med  Patient aware of MD's recommendation, to start potassium medication. Pt to  use same dose prior stoping medication. Potassium  20 MEQ once daily.  An appointment for BMET was scheduled for pt. for Tuesday 07/06/10 at [facility] lab as requested per pt.

## 2010-10-22 NOTE — Progress Notes (Signed)
Summary: pt has questions  Phone Note Call from Patient Call back at Work Phone [phone]   Caller: Patient Reason for Call: Talk to Nurse, Talk to Doctor Summary of Call: pt was unable to get lab work this morning becasue Rx was not left up front which Thurston Hole was suppose to leave up front. she also has questions about her medications she has edema and she has questions Initial call taken by: Omer Jack,  June 19, 2010 9:13 AM  Follow-up for Phone Call        Pt. had BMP drawn at [facility] and wants those results faxed here. I will contact them to see if they will fax them to [place]. Pt. also c/o swelling in her LE & that her face seems more swollen. She is elevating her feet as much as possible. No CP or SOB. Spoke with Taryll Reichenberger & will try adding 20mg  by mouth Lasix daily. Instructed pt. to call back if her swelling does not improve or she starts to become more SOB. Whitney Maeola Sarah RN  June 19, 2010 9:41 AM     New/Updated Medications: FUROSEMIDE 20 MG TABS (FUROSEMIDE) Take one tablet by mouth daily. Prescriptions: FUROSEMIDE 20 MG TABS (FUROSEMIDE) Take one tablet by mouth daily.  #30 x 3   Entered by:   Ellender Hose RN   Authorized by:   Marca Ancona, MD   Signed by:   Ellender Hose RN on 06/19/2010   Method used:   Electronically to        [facility]. # [id number]* (retail)       [address]       [place], [place]  [address]       Ph: [phone] or [phone]       Fax: [phone]   RxID:   [id number]   Appended Document: pt has questions lab order for BMP left at front desk  06/18/10 for pt to pick up 06/19/10--the lab order was overlooked by front desk staff, Elisabeth Cara

## 2010-10-22 NOTE — Progress Notes (Signed)
Summary: calling back about feet swelling  Phone Note Call from Patient Call back at Work Phone [phone]   Caller: Patient Summary of Call: Pt calling back regarding her feet swelling and medication question Initial call taken by: Judie Grieve,  May 10, 2010 1:06 PM  Follow-up for Phone Call        PT AWARE OF MED CHANGE PER DR [facility] AND REPEAT LABS FOR 05/29/2010 AT 8:30. Follow-up by: Scherrie Bateman, LPN,  May 10, 2010 1:22 PM    New/Updated Medications: HYDROCHLOROTHIAZIDE 50 MG TABS (HYDROCHLOROTHIAZIDE) 1 once daily LISINOPRIL 10 MG TABS (LISINOPRIL) 1 once daily . Prescriptions: LISINOPRIL 10 MG TABS (LISINOPRIL) 1 once daily  #30 x 3   Entered by:   Scherrie Bateman, LPN   Authorized by:   Marca Ancona, MD   Signed by:   Scherrie Bateman, LPN on 09/81/1914   Method used:   Electronically to        [facility] [address] # [id number]* (retail)       [address]       [place], [place]  [address]       Ph: [phone] or [phone]       Fax: [phone]   RxID:   [id number] HYDROCHLOROTHIAZIDE 50 MG TABS (HYDROCHLOROTHIAZIDE) 1 once daily  #30 x 3   Entered by:   Scherrie Bateman, LPN   Authorized by:   Marca Ancona, MD   Signed by:   Scherrie Bateman, LPN on 63/87/5643   Method used:   Electronically to        [facility] [id number]* (retail)       [address]       [place], [place]  [address]       Ph: [phone] or [phone]       Fax: [phone]   RxID:   [id number]

## 2010-10-22 NOTE — Progress Notes (Signed)
Summary: B/P readings  Phone Note Outgoing Call   Call placed by: Katina Dung, RN, BSN,  May 31, 2010 3:07 PM Call placed to: Patient Summary of Call: B/P readings  Follow-up for Phone Call        I talked with pt about recent B/P --pt states B/P averaging 130s/80s-reviewed with Dr Era Skeen med changes--see BMP from today    New/Updated Medications: KLOR-CON M20 20 MEQ CR-TABS (POTASSIUM CHLORIDE CRYS CR) two in the morning and one in the evening Prescriptions: KLOR-CON M20 20 MEQ CR-TABS (POTASSIUM CHLORIDE CRYS CR) two in the morning and one in the evening  #90 x 11   Entered by:   Katina Dung, RN, BSN   Authorized by:   Marca Ancona, MD   Signed by:   Katina Dung, RN, BSN on 05/31/2010   Method used:   Electronically to        [facility] # [id number]* (retail)       [address]       [place], [place]  [address]       Ph: [phone] or [phone]       Fax: [phone]   RxID:   [id number]

## 2010-10-22 NOTE — Progress Notes (Signed)
Summary: BMP RESULTS to be FAXED 9/29  Phone Note Call from Patient   Initial call taken by: Whitney Maeola Sarah RN,  June 19, 2010 9:48 AM  Follow-up for Phone Call        [facility] and they will fax pt's BMP results in the morning. Whitney Maeola Sarah RN  June 19, 2010 9:51 AM  Follow-up by: Whitney Maeola Sarah RN,  June 19, 2010 9:47 AM

## 2010-10-22 NOTE — Progress Notes (Signed)
Summary: calling re change in med  Phone Note Call from Patient   Caller: Patient Reason for Call: Talk to Nurse Summary of Call: pt wants to talk to anne re a med change and to see if labs were sent to a another dr-pls call [phone] Initial call taken by: Glynda Jaeger,  June 06, 2010 11:45 AM    New/Updated Medications: HYDROCHLOROTHIAZIDE 25 MG TABS (HYDROCHLOROTHIAZIDE) one daily Prescriptions: HYDROCHLOROTHIAZIDE 25 MG TABS (HYDROCHLOROTHIAZIDE) one daily  #30 x 6   Entered by:   Katina Dung, RN, BSN   Authorized by:   Marca Ancona, MD   Signed by:   Katina Dung, RN, BSN on 06/06/2010   Method used:   Electronically to        [facility] # [id number]* (retail)       [address]       [place], [place]  [address]       Ph: [phone] or [phone]       Fax: [phone]   RxID:   [id number]

## 2010-10-22 NOTE — Progress Notes (Signed)
Summary: question re lab work--B/P readings 06/18/10  Phone Note Call from Patient   Caller: Patient Reason for Call: Talk to Nurse Summary of Call: pt has labs in the am here as well as at [facility]-she wants to know if she can do the labs all in one place-either here or there-pls call [phone] ok to leave msg Initial call taken by: Glynda Jaeger,  June 18, 2010 9:36 AM  Follow-up for Phone Call        I talked with pt--she is requesting a lab order to take to [facility] for [facility] scheduled for 06/19/10--I will leave order for BMP  at desk for pt to pick up --She did not have recent B/P readings with her today--she will either drop off a list of readings when she picks up lab order tomorrow or she will take a list of  her of B/P readings to  work with her on Thursday and I will followup with her on Thursday Katina Dung, RN, BSN  June 18, 2010 9:54 AM   Additional Follow-up for Phone Call Additional follow up Details #1::        06/05/10 148/86 06/06/10 163/97 06/07/10 146/92 06/08/10 157/89 06/09/10 156/89 06/10/10 140/81 06/11/10 120/91 06/12/10 169/102 06/13/10 154/89 06/16/10 144/84 06/17/10 150/93 06/18/10 164/89 06/19/10 148/86    pt did not start Lasix 06/19/10 due to gout--pt states she feels her gout is resolving and the pain is better from gout--she also states her rheumatologist increased her Prednisone yesterday--pt thinks these B/Ps may have been a little high because the pain from the gout--I will forward to Dr Shirlee Latch for review Luana Shu I will call [facility] [phone]  Appended Document: question re lab work--B/P readings 06/18/10 Running high, may be worse from gout pain.  Would stop Toprol XL and replace with Coreg 25 mg two times a day.  If wants to hold off and see if BP gets better once gout resolves I would be ok with that.   Appended Document: question re lab work--B/P readings 06/18/10 I discussed with pt by telephone--she does not want to stop Toprol at all--she  is scheduled for renal artery doppler on Monday 06/24/10--she prefers not to make any medication changes right now--she will continue to take and record her B/P --I will followup with her next week after Dr Shirlee Latch has reviewed the renal artery doppler  Appended Document: question re lab work--B/P readings 06/18/10 LMTCB  Appended Document: question re lab work--B/P readings 06/18/10 I talked with pt--see phone note 06/27/10 with B/P readings

## 2010-10-24 ENCOUNTER — Encounter: Payer: Self-pay | Admitting: Adult Health

## 2010-10-24 ENCOUNTER — Ambulatory Visit (INDEPENDENT_AMBULATORY_CARE_PROVIDER_SITE_OTHER): Payer: PRIVATE HEALTH INSURANCE | Admitting: Adult Health

## 2010-10-24 DIAGNOSIS — J441 Chronic obstructive pulmonary disease with (acute) exacerbation: Secondary | ICD-10-CM

## 2010-10-24 DIAGNOSIS — R0902 Hypoxemia: Secondary | ICD-10-CM

## 2010-10-24 DIAGNOSIS — J189 Pneumonia, unspecified organism: Secondary | ICD-10-CM

## 2010-10-24 NOTE — Assessment & Plan Note (Signed)
Summary: pulse ox 83//sh   Visit Type:  Initial Consult Primary Provider/Referring Provider:  Georgette Shell, PA, Dr. Kellie Simmering  CC:  Pulmonary Consult for SOB. Marland Kitchen  History of Present Illness: October 17, 2010: 65 year old obese female, ex-25 pack smoker, Von Willebrand disease, RA (on chronic prednisone since summer 2011, intolerant to few weeks of methotrexate summer 2011), Gout. Used to be followed by Dr Maple Hudson in 2005 -2006. At that time she followed for small pulmonary  nodules that were stable.   Baseline health till a week ago. Felt a tickle on throat 1 week ago. On 10/14/2010 started wheezing but went to work. Later that night increased wheeze and developed wet cough with mild yellow sputum. On t[facility]  Pulse ox was 91% (states that baseline she has 'low pulse ox') , CXR reportedly showed pneumonia. Started on doxycycline, mucinex. She [facility] at [facility]. Was subjectively not better. Pulse ox was low in the 70-80s and then referred here. Currently still coughing, wheezing, not resting well, general fatigue, yellow sputum. She feels overall very mild improvement since starting doxy 2 days ago - wheezing less, coughing less and reduction in volume of sputum.   Of note, she rRecollects similar illness in 2004 when she came off cruise ship and got ill and had low pulse ox and was Rx with abx. AT that time she was on home o2 for few months and was discontinued because she reportedly was better.  REcollects PFTs - results she does not know - I have ordered this for review   Preventive Screening-Counseling & Management  Alcohol-Tobacco     Smoking Status: quit     Packs/Day: 1.0     Year Started: 1964     Year Quit: 2000     Pack years: 25  Current Medications (verified): 1)  Levothroid 75 Mcg Tabs (Levothyroxine Sodium) .... Once Daily 2)  Metoprolol Succinate 50 Mg Xr24h-Tab (Metoprolol Succinate) .... Take One Tablet in The Morning 3)  Metoprolol  Succinate 100 Mg Xr24h-Tab (Metoprolol Succinate) .... Take One Tablet in The Evening 4)  Micardis 80 Mg Tabs (Telmisartan) .... Take One Tablet Once Daily 5)  Lipitor 40 Mg Tabs (Atorvastatin Calcium) .... Take 1 Tablet By Mouth Once A Day 6)  Probenecid 500 Mg Tabs (Probenecid) .... 2 By Mouth Daily 7)  Prednisone 5 Mg Tabs (Prednisone) .... Once Daily As Needed For Gout Flare. Reduce To 1/2 Tablet 8)  Tramadol Hcl 50 Mg Tabs (Tramadol Hcl) .... One Tablet As Needed 9)  Aspirin 81 Mg Tabs (Aspirin) .... Once Daily 10)  Warfarin Sodium 5 Mg Tabs (Warfarin Sodium) .... Take One and 1/2 Tablet Every Day 11)  Fish Oil 1000 Mg Caps (Omega-3 Fatty Acids) .... 2 Capsules Daily 12)  Lisinopril 40 Mg Tabs (Lisinopril) .... One By Mouth Daily 13)  Amlodipine Besylate 5 Mg Tabs (Amlodipine Besylate) .... One By Mouth Daily 14)  Hydrochlorothiazide 50 Mg Tabs (Hydrochlorothiazide) .... One Daily 15)  Klor-Con M20 20 Meq Cr-Tabs (Potassium Chloride Crys Cr) .... One Daily 16)  Doxycycline Hyclate 100 Mg Tabs (Doxycycline Hyclate) .... Take 1 Tablet By Mouth Two Times A Day X 10 Days 17)  Proair Hfa 108 (90 Base) Mcg/act Aers (Albuterol Sulfate) .Marland Kitchen.. 1 Puff Every 4-6 Hours As Needed  Allergies (verified): 1)  ! Allopurinol 2)  ! Macrobid 3)  ! Keflex 4)  ! Sulfa  Past History:  Past Surgical History: Cholecystectomy Total Abdominal Hysterectomy  Family History: Mother with "enlarged heart," probably died of MI at age 1.   Father passed away young from cancer.   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  Started in 1964, 1ppd. . Has children. Smoking Status:  quit Packs/Day:  1.0 Pack years:  25  Review of Systems       The patient complains of shortness of breath with activity, productive cough, nasal congestion/difficulty breathing through nose, sneezing, hand/feet swelling, and joint  stiffness or pain.  The patient denies shortness of breath at rest, non-productive cough, coughing up blood, chest pain, irregular heartbeats, acid heartburn, indigestion, loss of appetite, weight change, abdominal pain, difficulty swallowing, sore throat, tooth/dental problems, headaches, itching, ear ache, anxiety, depression, rash, change in color of mucus, and fever.    Vital Signs:  Patient profile:   65 year old female Height:      63.5 inches Weight:      197.25 pounds BMI:     34.52 O2 Sat:      71 % on Room air Temp:     98.2 degrees F oral Pulse rate:   99 / minute BP sitting:   152 / 100  (right arm) Cuff size:   regular  Vitals Entered By: Carron Curie CMA (October 17, 2010 2:46 PM)  O2 Flow:  Room air  O2 Sat Comments placed pt on 2 liters oxygen and sats increased to 92%.  CC: Pulmonary Consult for SOB.  Comments Medications reviewed with patient Carron Curie CMA  October 17, 2010 2:59 PM Daytime phone number verified with patient.    Physical Exam  General:  well developed, well nourished, in no acute distressobese.   Head:  normocephalic and atraumatic Eyes:  PERRLA/EOM intact; conjunctiva and sclera clear Ears:  TMs intact and clear with normal canals Nose:  no deformity, discharge, inflammation, or lesions Mouth:  no deformity or lesions uvula area mildly red Melampatti Class IV.   Neck:  no masses, thyromegaly, or abnormal cervical nodes Chest Wall:  no deformities noted Lungs:  decreased BS on R and rhonchi bilateral.   no distress full sentences no acc muscle use no cyanosis  Heart:  regular rate and rhythm, S1, S2 without murmurs, rubs, gallops, or clicks Abdomen:  bowel sounds positive; abdomen soft and non-tender without masses, or organomegaly Msk:  no deformity or scoliosis noted with normal posture Pulses:  pulses normal Extremities:  no clubbing, cyanosis, edema, or deformity noted Neurologic:  CN II-XII grossly intact with  normal reflexes, coordination, muscle strength and tone Skin:  intact without lesions or rashes Cervical Nodes:  no significant adenopathy Axillary Nodes:  no significant adenopathy Psych:  alert and cooperative; normal mood and affect; normal attention span and concentration   CXR  Procedure date:  10/17/2010  Findings:      RLL infiltrate  Comments:      [facility] and independently reviewed  MISC. Report  Procedure date:  10/17/2010  Findings:      wc 3.8  Impression & Recommendations:  Problem # 1:  PNEUMONIA, ORGANISM UNSPECIFIED (ICD-486) continue doxycycline 100mg  by mouth two times a day x 7 days total - nurse will ensure you have 7 day suppoly Her updated medication list for this problem includes:    Doxycycline Hyclate 100 Mg Tabs (Doxycycline hyclate) .Marland Kitchen... Take 1 tablet by mouth two times a day x 10 days  Orders:  DME Referral (DME) Consultation Level V [phone]) Prescription Created Electronically [phone])  Problem # 2:  CHRONIC OBSTRUCTIVE PULMONARY DISEASE, ACUTE EXACERBATION (ICD-491.21) Assessment: New neb and steroid in office do prednisone burst  as advised take albuterol 2 puff as needed - take sample start symbicort 2 puff two times a day  - talke samples and learn technique you need 2L home o2 continuous call us tomorrow with progres at fu will need spirometry to confirm COPD dx wil check old pft as well to confoirm COPD dx Orders: DME Referral (DME) Consultation Level V [phone]) Prescription Created Electronically [phone]) HFA Instruction [phone])  Problem # 3:  HYPOXEMIA (ICD-799.02) Assessment: New due to above. she prefers not to get admitted. Clinically she seems ok for home Rx. She agrees t go to ER if worse  plan home o2 set up today  reassess need at fu Orders: DME Referral (DME) Consultation Level V (13086)  Medications Added to Medication List This Visit: 1)  Lipitor 40 Mg Tabs (Atorvastatin calcium) .... Take 1 tablet  by mouth once a day 2)  Doxycycline Hyclate 100 Mg Tabs (Doxycycline hyclate) .... Take 1 tablet by mouth two times a day x 10 days 3)  Proair Hfa 108 (90 Base) Mcg/act Aers (Albuterol sulfate) .Marland Kitchen.. 1 puff every 4-6 hours as needed 4)  Prednisone 10 Mg Tabs (Prednisone) .... Tak 4 tab daily x3 days, then 3 tab daily x 3 days, then 2 tabs daily x3 days, then 1 tab daily x3 days, then continue  5mg  per day 5)  Symbicort 80-4.5 Mcg/act Aero (Budesonide-formoterol fumarate) .... Two puffs twice daily 6)  Proair Hfa 108 (90 Base) Mcg/act Aers (Albuterol sulfate) .Marland Kitchen.. 1-2 puffs every 4-6 hours as needed  Patient Instructions: 1)  you have right lower lobe pneumonia and copd attack 2)  continue doxycycline 100mg  by mouth two times a day x 7 days total - nurse will ensure you have 7 day suppoly 3)  do prednisone burst  as advised 4)  take albuterol 2 puff as needed - take sample 5)  start symbicort 2 puff two times a day 6)   - talke samples and learn technique 7)  you need 2L home o2 continuous 8)  call us tomorrow with progres 9)  return in < 1 week to see me or my NP 10)  will review old spirometry for evidence of copd 11)  if worse go to ER for admission Prescriptions: PROAIR HFA 108 (90 BASE) MCG/ACT  AERS (ALBUTEROL SULFATE) 1-2 puffs every 4-6 hours as needed  #1 x 1   Entered and Authorized by:   Kalman Shan MD   Signed by:   Kalman Shan MD on 10/17/2010   Method used:   Electronically to        [facility]  [address] # [id number]* (retail)       [address]       [place], [place]  [address]       Ph: [phone] or [phone]       Fax: [phone]   RxID:   [id number] SYMBICORT 80-4.5 MCG/ACT  AERO (BUDESONIDE-FORMOTEROL FUMARATE) Two puffs twice daily  #1 x 2   Entered and Authorized by:   Kalman Shan MD   Signed by:   Kalman Shan MD on 10/17/2010   Method used:   Electronically to        [facility]  [address] # [id number]* (retail)  [address]       [place], [place]  [address]       Ph: [phone] or [phone]       Fax: [phone]   RxID:   [id number] PREDNISONE 10 MG  TABS (PREDNISONE) Tak 4 tab daily x3 days, then 3 tab daily x 3 days, then 2 tabs daily x3 days, then 1 tab daily x3 days, then continue  5mg  per day  #30 x 1   Entered and Authorized by:   Kalman Shan MD   Signed by:   Kalman Shan MD on 10/17/2010   Method used:   Electronically to        [facility]  [address] # [id number]* (retail)       [address]       [place], [place]  [address]       Ph: [phone] or [phone]       Fax: [phone]   RxID:   [id number]    Immunization History:  Influenza Immunization History:    Influenza:  historical (06/24/2010)   Appended Document: pulse ox 83//sh [facility] to [facility]  at return assess for pneumovax  Appended Document: pulse ox 83//sh    Clinical Lists Changes  Orders: Added new Service order of Admin of Therapeutic Inj  intramuscular or subcutaneous (93810) - Signed Added new Service order of Depo- Medrol 80mg  (J1040) - Signed Added new Service order of Nebulizer Tx (17510) - Signed Added new Service order of Albuterol Sulfate Sol 1mg  unit dose (C5852) - Signed       Medication Administration  Injection # 1:    Medication: Depo- Medrol 80mg     Diagnosis: CHRONIC OBSTRUCTIVE PULMONARY DISEASE, ACUTE EXACERBATION (ICD-491.21)    Route: IM    Site: LUOQ gluteus    Exp Date: 12/2012    Lot #: [id number]    Mfr: [employer]    Patient tolerated injection without complications    Given by: Carron Curie CMA (October 17, 2010 5:04 PM)  Medication # 1:    Medication: Albuterol Sulfate Sol 1mg  unit dose    Diagnosis: CHRONIC OBSTRUCTIVE PULMONARY DISEASE, ACUTE EXACERBATION (ICD-491.21)    Dose: 1 vial    Route: inhaled    Exp Date: 10-2011    Lot #: [id number]    Mfr: nephron    Patient tolerated medication without  complications    Given by: Carron Curie CMA (October 17, 2010 5:09 PM)  Orders Added: 1)  Admin of Therapeutic Inj  intramuscular or subcutaneous [96372] 2)  Depo- Medrol 80mg  [J1040] 3)  Nebulizer Tx [35361] 4)  Albuterol Sulfate Sol 1mg  unit dose [W4315]  Appended Document: pulse ox 83//sh note faxed

## 2010-10-24 NOTE — Progress Notes (Signed)
Summary: FYI  Phone Note Call from Patient Call back at [facility] Phone [phone]   Caller: Patient Call For: [email or web address] Reason for Call: Talk to Nurse Summary of Call: FYI:  Patient saw Dr. Marchelle Gearing yesterday and was told to call today to let him know how she was.  She says she feeling and breathing better, so she seems to be improving. Initial call taken by: Lehman Prom,  October 18, 2010 12:52 PM  Follow-up for Phone Call        Spoke with pt.  She states that she is calling to report that she slept well last night, and that her breathing has improved already.  I advised that we are happy to hear this and to cont taking meds and follow intructions per ov yesterday and we will see her at planned ov with TP next wk 10/24/10.  Pt verbalized understanding and will keep rov.  Will forward to MR as Fyi.  Follow-up by: Vernie Murders,  October 18, 2010 3:33 PM  Additional Follow-up for Phone Call Additional follow up Details #1::        thanks a lot. Please let her know I reviewed old PFTs from 2005 or so that Dr young had done and she has copd and current Rx course is the correct one. I am glad she is feeling better Additional Follow-up by: Kalman Shan MD,  October 18, 2010 3:35 PM    Additional Follow-up for Phone Call Additional follow up Details #2::    Spoke with pt and notified of the above recs per MR.  She verbalized understanding. Follow-up by: Vernie Murders,  October 18, 2010 3:46 PM

## 2010-10-25 NOTE — Letter (Signed)
Summary: [facility]  [facility]   Imported By: Lennie Odor 08/05/2010 14:37:13  _____________________________________________________________________  External Attachment:    Type:   Image     Comment:   External Document

## 2010-10-30 NOTE — Letter (Signed)
Summary: [facility] [place]   Imported By: Lester [place] 10/23/2010 08:16:09  _____________________________________________________________________  External Attachment:    Type:   Image     Comment:   External Document

## 2010-10-30 NOTE — Progress Notes (Signed)
Summary: Pulmonary/[facility]  Pulmonary/[facility]   Imported By: Lester [place] 10/23/2010 08:09:12  _____________________________________________________________________  External Attachment:    Type:   Image     Comment:   External Document

## 2010-10-30 NOTE — Assessment & Plan Note (Addendum)
Summary: NP follow up - COPD   Vital Signs:  Patient profile:   66 year old female Height:      63.5 inches Weight:      196.50 pounds BMI:     34.39 O2 Sat:      90 % on 2 L/min pulsing Temp:     98.9 degrees F oral Pulse rate:   100 / minute BP sitting:   190 / 90  (left arm) Cuff size:   regular  Vitals Entered By: Boone Master CNA/MA (October 24, 2010 9:31 AM)  O2 Flow:  2 L/min pulsing CC: 1 week follow up - states breathing has improved.  still having prod cough with clear mucus, "a little" wheezing and still gets tired easily Is Patient Diabetic? No Comments Medications reviewed with patient Daytime contact number verified with patient. Boone Master CNA/MA  October 24, 2010 9:33 AM    Primary Provider/Referring Provider:  Georgette Shell, PA, Dr. Kellie Simmering  CC:  1 week follow up - states breathing has improved.  still having prod cough with clear mucus and "a little" wheezing and still gets tired easily.  History of Present Illness: 65 year old obese female, ex-25 pack smoker, Von Willebrand disease, RA (on chronic prednisone since summer 2011, intolerant to few weeks of methotrexate summer 2011), Gout. Used to be followed by Dr Maple Hudson in 2005 -2006. At that time she followed for small pulmonary  nodules that were stable.  October 17, 2010>> Baseline health till a week ago. Felt a tickle on throat 1 week ago. On 10/14/2010 started wheezing but went to work. Later that night increased wheeze and developed wet cough with mild yellow sputum. On tuesday 10/15/2010 [facility]  Pulse ox was 91% (states that baseline she has 'low pulse ox') , CXR reportedly showed pneumonia. Started on doxycycline, mucinex. She followed up today at [facility]. Was subjectively not better. Pulse ox was low in the 70-80s and then referred here. Currently still coughing, wheezing, not resting well, general fatigue, yellow sputum. She feels overall very mild improvement since starting doxy 2 days ago  - wheezing less, coughing less and reduction in volume of sputum.  Of note, she rRecollects similar illness in 2004 when she came off cruise ship and got ill and had low pulse ox and was Rx with abx. AT that time she was on home o2 for few months and was discontinued because she reportedly was better.  REcollects PFTs - results she does not know - I have ordered this for review  October 24, 2010--Presents for 1 week follow up for PNA. States breathing has improved.  Still having prod cough with clear mucus, "a little" wheezing and still gets tired easily. Overall she is doing much better. Appetite has improved. She has less dyspnea and cough. Wearing O2 24/7. On note she is on ACE inhibitor w/ slight dry cough since starting.Denies chest pain,  orthopnea, hemoptysis, fever, n/v/d, edema, headache,.  Medications Prior to Update: 1)  Levothroid 75 Mcg Tabs (Levothyroxine Sodium) .... Once Daily 2)  Metoprolol Succinate 50 Mg Xr24h-Tab (Metoprolol Succinate) .... Take One Tablet in The Morning 3)  Metoprolol Succinate 100 Mg Xr24h-Tab (Metoprolol Succinate) .... Take One Tablet in The Evening 4)  Micardis 80 Mg Tabs (Telmisartan) .... Take One Tablet Once Daily 5)  Lipitor 40 Mg Tabs (Atorvastatin Calcium) .... Take 1 Tablet By Mouth Once A Day 6)  Probenecid 500 Mg Tabs (Probenecid) .... 2 By  Mouth Daily 7)  Prednisone 5 Mg Tabs (Prednisone) .... Once Daily As Needed For Gout Flare. Reduce To 1/2 Tablet 8)  Tramadol Hcl 50 Mg Tabs (Tramadol Hcl) .... One Tablet As Needed 9)  Aspirin 81 Mg Tabs (Aspirin) .... Once Daily 10)  Warfarin Sodium 5 Mg Tabs (Warfarin Sodium) .... Take One and 1/2 Tablet Every Day 11)  Fish Oil 1000 Mg Caps (Omega-3 Fatty Acids) .... 2 Capsules Daily 12)  Lisinopril 40 Mg Tabs (Lisinopril) .... One By Mouth Daily 13)  Amlodipine Besylate 5 Mg Tabs (Amlodipine Besylate) .... One By Mouth Daily 14)  Hydrochlorothiazide 50 Mg Tabs (Hydrochlorothiazide) .... One Daily 15)   Klor-Con M20 20 Meq Cr-Tabs (Potassium Chloride Crys Cr) .... One Daily 16)  Doxycycline Hyclate 100 Mg Tabs (Doxycycline Hyclate) .... Take 1 Tablet By Mouth Two Times A Day X 10 Days 17)  Proair Hfa 108 (90 Base) Mcg/act Aers (Albuterol Sulfate) .Marland Kitchen.. 1 Puff Every 4-6 Hours As Needed 18)  Prednisone 10 Mg  Tabs (Prednisone) .... Tak 4 Tab Daily X3 Days, Then 3 Tab Daily X 3 Days, Then 2 Tabs Daily X3 Days, Then 1 Tab Daily X3 Days, Then Continue  5mg  Per Day 19)  Symbicort 80-4.5 Mcg/act  Aero (Budesonide-Formoterol Fumarate) .... Two Puffs Twice Daily 20)  Proair Hfa 108 (90 Base) Mcg/act  Aers (Albuterol Sulfate) .Marland Kitchen.. 1-2 Puffs Every 4-6 Hours As Needed  Current Medications (verified): 1)  Levothroid 75 Mcg Tabs (Levothyroxine Sodium) .... Once Daily 2)  Metoprolol Succinate 50 Mg Xr24h-Tab (Metoprolol Succinate) .... Take One Tablet in The Morning 3)  Metoprolol Succinate 100 Mg Xr24h-Tab (Metoprolol Succinate) .... Take One Tablet in The Evening 4)  Micardis 80 Mg Tabs (Telmisartan) .... Take One Tablet Once Daily 5)  Lipitor 40 Mg Tabs (Atorvastatin Calcium) .... Take 1 Tablet By Mouth Once A Day 6)  Probenecid 500 Mg Tabs (Probenecid) .... 2 By Mouth Daily 7)  Prednisone 5 Mg Tabs (Prednisone) .... Once Daily As Needed For Gout Flare. Reduce To 1/2 Tablet 8)  Tramadol Hcl 50 Mg Tabs (Tramadol Hcl) .... One Tablet As Needed 9)  Aspirin 81 Mg Tabs (Aspirin) .... Once Daily 10)  Warfarin Sodium 5 Mg Tabs (Warfarin Sodium) .... Take One and 1/2 Tablet Every Day 11)  Fish Oil 1000 Mg Caps (Omega-3 Fatty Acids) .... 2 Capsules Daily 12)  Lisinopril 40 Mg Tabs (Lisinopril) .... One By Mouth Daily 13)  Amlodipine Besylate 5 Mg Tabs (Amlodipine Besylate) .... One By Mouth Daily 14)  Hydrochlorothiazide 50 Mg Tabs (Hydrochlorothiazide) .... One Daily 15)  Klor-Con M20 20 Meq Cr-Tabs (Potassium Chloride Crys Cr) .... One Daily 16)  Doxycycline Hyclate 100 Mg Tabs (Doxycycline Hyclate) .... Take 1  Tablet By Mouth Two Times A Day X 10 Days 17)  Proair Hfa 108 (90 Base) Mcg/act Aers (Albuterol Sulfate) .Marland Kitchen.. 1 Puff Every 4-6 Hours As Needed 18)  Prednisone 10 Mg  Tabs (Prednisone) .... Tak 4 Tab Daily X3 Days, Then 3 Tab Daily X 3 Days, Then 2 Tabs Daily X3 Days, Then 1 Tab Daily X3 Days, Then Continue  5mg  Per Day 19)  Symbicort 80-4.5 Mcg/act  Aero (Budesonide-Formoterol Fumarate) .... Two Puffs Twice Daily 20)  Proair Hfa 108 (90 Base) Mcg/act  Aers (Albuterol Sulfate) .Marland Kitchen.. 1-2 Puffs Every 4-6 Hours As Needed 21)  Oxygen 2l/min .... Wear 24 / 7  Allergies (verified): 1)  ! Allopurinol 2)  ! Macrobid 3)  ! Keflex 4)  ! Sulfa  Past History:  Past Medical History: Last updated: 08/13/2010 1. Hyperlipidemia 2. HTN: renal artery ultrasound (10/11) with no renal artery stenosis.  3. Hypothyroidism 4. History of UTIs 5. 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  Followed by Dr. Darnelle Catalan.  On permanent warfarin. 6. Obesity 7. Gout: questionable.  Uric acid normal when checked in 10/11.  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 Echo (8/11): moderate focal basal septal hypertrophy, EF 55-60%, no LV outflow tract gradient, grade I diastolic dysfunction, no regional wall motion abnormalities, no systolic anteiror motion of the mitral valve, PA systolic pressure 30 mmHg.  10. Low back pain 11. Rheumatoid arthritis on MTX  Past Surgical History: Last updated: 10/17/2010 Cholecystectomy Total Abdominal Hysterectomy  Family History: Last updated: 10/17/2010 Mother with "enlarged heart," probably died of MI at age 16.   Father passed away young from cancer.   Social History: Last updated: 10/17/2010 Works in accounts payable department.  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  Started in 1964, 1ppd. . Has children.   Risk Factors: Smoking Status: quit (10/17/2010) Packs/Day: 1.0  (10/17/2010)  Review of Systems      See HPI  Physical Exam  Additional Exam:  GEN: A/Ox3; pleasant , NAD HEENT:  [place]/AT, , EACs-clear, TMs-wnl, NOSE-clear, THROAT-clear NECK:  Supple w/ fair ROM; no JVD; normal carotid impulses w/o bruits; no thyromegaly or nodules palpated; no lymphadenopathy. RESP  Coarse BS w/ no wheezing. CARD:  RRR, no m/r/g   GI:   Soft & nt; nml bowel sounds; no organomegaly or masses detected. [place]: Warm bil,  no calf tenderness edema, clubbing, pulses intact Neuro: intact w/ no focal deficits noted.   Impression & Recommendations:  Problem # 1:  PNEUMONIA, ORGANISM UNSPECIFIED (ICD-486)  Clinically improving  will finish abx and steroids follow up Dr. Marchelle Gearing in 2 weeks w/ cxr  Her updated medication list for this problem includes:    Doxycycline Hyclate 100 Mg Tabs (Doxycycline hyclate) .Marland Kitchen... Take 1 tablet by mouth two times a day x 10 days  Orders: Est. Patient Level III (45409)  Problem # 2:  CHRONIC OBSTRUCTIVE PULMONARY DISEASE, ACUTE EXACERBATION (ICD-491.21)  improving. will need pft in future once recovered from acute illness cont on o2 for now -reevauate on return  of note on ace -may need to evaluate use if cough or flare slow to resolve.  Orders: Est. Patient Level III (81191)  Medications Added to Medication List This Visit: 1)  Oxygen 2l/min  .... Wear 24 / 7  Patient Instructions: 1)  Finish Doxycycline  2)  Taper  Prednisone to 5mg  and hold.  3)  Mucinex DM two times a day as needed cough/congestion. 4)  Increase fluids and rest. 5)  follow up Dr. Marchelle Gearing in 2 weeks.and as needed    Orders Added: 1)  Est. Patient Level III [47829]

## 2010-10-30 NOTE — Progress Notes (Signed)
Summary: Pulmonary/[place]  Pulmonary/[place]   Imported By: Lester Arena 10/23/2010 08:19:26  _____________________________________________________________________  External Attachment:    Type:   Image     Comment:   External Document

## 2010-10-30 NOTE — Letter (Signed)
Summary: Audree Bane MD  Audree Bane MD   Imported By: Lester [place] 10/23/2010 08:17:37  _____________________________________________________________________  External Attachment:    Type:   Image     Comment:   External Document

## 2010-10-30 NOTE — Letter (Signed)
Summary: Out of Work  [employer]  [address]   [place], [place] [address]   Phone: [phone]  Fax: [phone]    October 24, 2010   Employee:  KASHAWNA MANZER    To Whom It May Concern:   For Medical reasons, please excuse the above named employee from work for the following dates:  Start:   Thursday October 17, 2010  End:   Tuesday November 12, 2010  If you need additional information, please feel free to contact our office.         Sincerely,        Nyree Yonker, N.P.

## 2010-11-07 ENCOUNTER — Other Ambulatory Visit: Payer: Self-pay | Admitting: Oncology

## 2010-11-07 ENCOUNTER — Encounter (HOSPITAL_BASED_OUTPATIENT_CLINIC_OR_DEPARTMENT_OTHER): Payer: PRIVATE HEALTH INSURANCE | Admitting: Oncology

## 2010-11-07 DIAGNOSIS — Z7901 Long term (current) use of anticoagulants: Secondary | ICD-10-CM

## 2010-11-07 DIAGNOSIS — Z5181 Encounter for therapeutic drug level monitoring: Secondary | ICD-10-CM

## 2010-11-07 DIAGNOSIS — D689 Coagulation defect, unspecified: Secondary | ICD-10-CM

## 2010-11-07 LAB — CBC WITH DIFFERENTIAL/PLATELET
BASO%: 0.3 % (ref 0.0–2.0)
Basophils Absolute: 0 10*3/uL (ref 0.0–0.1)
EOS%: 2 % (ref 0.0–7.0)
Eosinophils Absolute: 0.2 10*3/uL (ref 0.0–0.5)
HCT: 35.3 % (ref 34.8–46.6)
HGB: 11.2 g/dL — ABNORMAL LOW (ref 11.6–15.9)
LYMPH%: 11.5 % — ABNORMAL LOW (ref 14.0–49.7)
MCH: 28.8 pg (ref 25.1–34.0)
MCHC: 31.7 g/dL (ref 31.5–36.0)
MCV: 90.7 fL (ref 79.5–101.0)
MONO#: 0.6 10*3/uL (ref 0.1–0.9)
MONO%: 6.6 % (ref 0.0–14.0)
NEUT#: 7.1 10*3/uL — ABNORMAL HIGH (ref 1.5–6.5)
NEUT%: 79.6 % — ABNORMAL HIGH (ref 38.4–76.8)
Platelets: 131 10*3/uL — ABNORMAL LOW (ref 145–400)
RBC: 3.89 10*6/uL (ref 3.70–5.45)
RDW: 15.6 % — ABNORMAL HIGH (ref 11.2–14.5)
WBC: 8.9 10*3/uL (ref 3.9–10.3)
lymph#: 1 10*3/uL (ref 0.9–3.3)

## 2010-11-07 LAB — PROTIME-INR
INR: 3.2 (ref 2.00–3.50)
Protime: 38.4 Seconds — ABNORMAL HIGH (ref 10.6–13.4)

## 2010-11-12 ENCOUNTER — Encounter: Payer: Self-pay | Admitting: Internal Medicine

## 2010-11-12 ENCOUNTER — Other Ambulatory Visit: Payer: Self-pay | Admitting: Internal Medicine

## 2010-11-12 ENCOUNTER — Ambulatory Visit (INDEPENDENT_AMBULATORY_CARE_PROVIDER_SITE_OTHER): Payer: PRIVATE HEALTH INSURANCE | Admitting: Internal Medicine

## 2010-11-12 ENCOUNTER — Ambulatory Visit (INDEPENDENT_AMBULATORY_CARE_PROVIDER_SITE_OTHER)
Admission: RE | Admit: 2010-11-12 | Discharge: 2010-11-12 | Disposition: A | Discharge status: Home / Self Care | Payer: PRIVATE HEALTH INSURANCE | Source: Ambulatory Visit | Attending: Internal Medicine | Admitting: Internal Medicine

## 2010-11-12 DIAGNOSIS — J449 Chronic obstructive pulmonary disease, unspecified: Secondary | ICD-10-CM

## 2010-11-12 DIAGNOSIS — R05 Cough: Secondary | ICD-10-CM | POA: Insufficient documentation

## 2010-11-12 DIAGNOSIS — J441 Chronic obstructive pulmonary disease with (acute) exacerbation: Secondary | ICD-10-CM

## 2010-11-12 DIAGNOSIS — J189 Pneumonia, unspecified organism: Secondary | ICD-10-CM

## 2010-11-12 DIAGNOSIS — R059 Cough, unspecified: Secondary | ICD-10-CM

## 2010-11-12 HISTORY — DX: Cough, unspecified: R05.9

## 2010-11-13 NOTE — Procedures (Signed)
Summary: Oxygen saturation/[facility]  Oxygen saturation/[facility]   Imported By: Lester [place] 11/08/2010 09:52:56  _____________________________________________________________________  External Attachment:    Type:   Image     Comment:   External Document

## 2010-11-15 ENCOUNTER — Telehealth (INDEPENDENT_AMBULATORY_CARE_PROVIDER_SITE_OTHER): Payer: Self-pay | Admitting: *Deleted

## 2010-11-18 ENCOUNTER — Telehealth: Payer: Self-pay | Admitting: Cardiology

## 2010-11-19 NOTE — Progress Notes (Signed)
Summary: lisinopril / cough  Phone Note Call from Patient Call back at Home Phone [phone]   Caller: Patient Call For: ramaswamy Summary of Call: pt wanted to know if MR had talked to dr Shirlee Latch re: stopping lisinopril and changing to another med ) because of coughing).  Initial call taken by: Tivis Ringer, CNA,  November 15, 2010 3:15 PM  Follow-up for Phone Call        MR, have you talked with Dr Shirlee Latch yet by any chance? Pls advise thanks!! Follow-up by: Vernie Murders,  November 15, 2010 3:24 PM  Additional Follow-up for Phone Call Additional follow up Details #1::        spoke to Dr. Shirlee Latch. REcommends  a) stop lisinopril b) change metoprolol XR to 100mg  by mouth two times a day c) increase amlodipine to 10mg  daily  monitor bp and hr at home and cal his nurse with updates  keep fu appt with me as per ov note Additional Follow-up by: Kalman Shan MD,  November 15, 2010 4:52 PM    Additional Follow-up for Phone Call Additional follow up Details #2::    Spoke with pt and advised of the above recs per MR.  Pt verbalized understanding.  She read the directions that I gave her back to me correctly. I offered to call in new rx for the amloidpine and metoprolol and she states that she just got a 90 day supply of each and does not need at this time.  She will take as directed and then if doing okay will call cards for new rxs.  She understands to call cards to report pulse rate and BP readings with these changes.  Follow-up by: Vernie Murders,  November 15, 2010 5:07 PM  New/Updated Medications: METOPROLOL SUCCINATE 100 MG XR24H-TAB (METOPROLOL SUCCINATE) 1 two times a day AMLODIPINE BESYLATE 5 MG TABS (AMLODIPINE BESYLATE) 2 by mouth daily

## 2010-11-28 ENCOUNTER — Telehealth: Payer: Self-pay | Admitting: Cardiology

## 2010-11-28 NOTE — Progress Notes (Signed)
Summary: pt request call  Phone Note Call from Patient Call back at [phone]   Caller: Patient Summary of Call: Pt request call Initial call taken by: Judie Grieve,  November 18, 2010 10:07 AM  Follow-up for Phone Call        [facility] Katina Dung, RN, BSN  November 18, 2010 2:00 PM     New/Updated Medications: AMLODIPINE BESYLATE 10 MG TABS (AMLODIPINE BESYLATE) one daily Prescriptions: METOPROLOL SUCCINATE 100 MG XR24H-TAB (METOPROLOL SUCCINATE) 1 two times a day  #180 x 3   Entered by:   Katina Dung, RN, BSN   Authorized by:   Marca Ancona, MD   Signed by:   Katina Dung, RN, BSN on 11/18/2010   Method used:   Print then Give to Patient   RxID:   [id number] AMLODIPINE BESYLATE 10 MG TABS (AMLODIPINE BESYLATE) one daily  #90 x 3   Entered by:   Katina Dung, RN, BSN   Authorized by:   Marca Ancona, MD   Signed by:   Katina Dung, RN, BSN on 11/18/2010   Method used:   Print then Give to Patient   RxID:   [id number] MICARDIS 80 MG TABS (TELMISARTAN) take one tablet once daily  #90 x 3   Entered by:   Katina Dung, RN, BSN   Authorized by:   Marca Ancona, MD   Signed by:   Katina Dung, RN, BSN on 11/18/2010   Method used:   Print then Give to Patient   RxID:   [id number]    Current Medications (verified): 1)  Levothroid 75 Mcg Tabs (Levothyroxine Sodium) .... Once Daily 2)  Metoprolol Succinate 100 Mg Xr24h-Tab (Metoprolol Succinate) .Marland Kitchen.. 1 Two Times A Day 3)  Micardis 80 Mg Tabs (Telmisartan) .... Take One Tablet Once Daily 4)  Lipitor 40 Mg Tabs (Atorvastatin Calcium) .... Take 1 Tablet By Mouth Once A Day 5)  Probenecid 500 Mg Tabs (Probenecid) .... 4  By Mouth Daily 6)  Prednisone 5 Mg Tabs (Prednisone) .... Once Daily As Needed For Gout Flare. Reduce To 1/2 Tablet 7)  Tramadol Hcl 50 Mg Tabs (Tramadol Hcl) .... One Tablet As Needed 8)  Aspirin 81 Mg Tabs (Aspirin) .... Once Daily 9)  Warfarin Sodium 5 Mg Tabs (Warfarin Sodium) ....  Take One and 1/2 Tablet Every Day 10)  Fish Oil 400mg  .... Take 1 Capsule By Mouth Once A Day 11)  Lisinopril 40 Mg Tabs (Lisinopril) .... One By Mouth Daily 12)  Amlodipine Besylate 10 Mg Tabs (Amlodipine Besylate) .... One Daily 13)  Hydrochlorothiazide 50 Mg Tabs (Hydrochlorothiazide) .... One Daily 14)  Klor-Con M20 20 Meq Cr-Tabs (Potassium Chloride Crys Cr) .... One Daily 15)  Proair Hfa 108 (90 Base) Mcg/act Aers (Albuterol Sulfate) .Marland Kitchen.. 1 Puff Every 4-6 Hours As Needed 16)  Oxygen 2l/min .... Wear 24 / 7  Allergies: 1)  ! Allopurinol 2)  ! Macrobid 3)  ! Keflex 4)  ! Sulfa  Appended Document: pt request call    Clinical Lists Changes  Medications: Rx of METOPROLOL SUCCINATE 100 MG XR24H-TAB (METOPROLOL SUCCINATE) 1 two times a day;  #180 x 9;  Signed;  Entered by: Katina Dung, RN, BSN;  Authorized by: Marca Ancona, MD;  Method used: Print then Give to Patient Rx of AMLODIPINE BESYLATE 10 MG TABS (AMLODIPINE BESYLATE) one daily;  #90 x 3;  Signed;  Entered by: Katina Dung, RN, BSN;  Authorized by: Marca Ancona, MD;  Method used: Print then Give to Patient Rx  of MICARDIS 80 MG TABS (TELMISARTAN) take one tablet once daily;  #90 x 3;  Signed;  Entered by: Katina Dung, RN, BSN;  Authorized by: Marca Ancona, MD;  Method used: Print then Give to Patient    Prescriptions: MICARDIS 80 MG TABS (TELMISARTAN) take one tablet once daily  #90 x 3   Entered by:   Katina Dung, RN, BSN   Authorized by:   Marca Ancona, MD   Signed by:   Katina Dung, RN, BSN on 11/18/2010   Method used:   Print then Give to Patient   RxID:   [id number] AMLODIPINE BESYLATE 10 MG TABS (AMLODIPINE BESYLATE) one daily  #90 x 3   Entered by:   Katina Dung, RN, BSN   Authorized by:   Marca Ancona, MD   Signed by:   Katina Dung, RN, BSN on 11/18/2010   Method used:   Print then Give to Patient   RxID:   [id number] METOPROLOL SUCCINATE 100 MG XR24H-TAB (METOPROLOL SUCCINATE) 1  two times a day  #180 x 9   Entered by:   Katina Dung, RN, BSN   Authorized by:   Marca Ancona, MD   Signed by:   Katina Dung, RN, BSN on 11/18/2010   Method used:   Print then Give to Patient   RxID:   [id number]    Appended Document: pt request call faxed to [facility] [phone]

## 2010-12-03 ENCOUNTER — Other Ambulatory Visit: Payer: Self-pay | Admitting: Oncology

## 2010-12-03 ENCOUNTER — Encounter (HOSPITAL_BASED_OUTPATIENT_CLINIC_OR_DEPARTMENT_OTHER): Payer: PRIVATE HEALTH INSURANCE | Admitting: Oncology

## 2010-12-03 DIAGNOSIS — D689 Coagulation defect, unspecified: Secondary | ICD-10-CM

## 2010-12-03 LAB — CBC WITH DIFFERENTIAL/PLATELET
BASO%: 0.3 % (ref 0.0–2.0)
HCT: 37 % (ref 34.8–46.6)
LYMPH%: 10.4 % — ABNORMAL LOW (ref 14.0–49.7)
MCHC: 31.9 g/dL (ref 31.5–36.0)
MCV: 90 fL (ref 79.5–101.0)
MONO#: 0.6 10*3/uL (ref 0.1–0.9)
NEUT%: 82.5 % — ABNORMAL HIGH (ref 38.4–76.8)
Platelets: 187 10*3/uL (ref 145–400)
WBC: 10.9 10*3/uL — ABNORMAL HIGH (ref 3.9–10.3)

## 2010-12-03 NOTE — Progress Notes (Signed)
Summary: question on meds  Phone Note Call from Patient Call back at Work Phone [phone]   Caller: Patient Reason for Call: Talk to Nurse Summary of Call: pt has question re meds. pt states her feet are swelling. Initial call taken by: Roe Coombs,  November 28, 2010 11:14 AM  Follow-up for Phone Call        pt called again becasue she has not heard anything yet and her edema is getting worse and uncomfortable. Omer Jack  November 28, 2010 2:25 PM -pt states since lisinopril was stopped and amlodipine increased  11/15/10  her feet and ankles have been so swollen that she can't tolerate the increased dose of  Amlodipine 10mg  --she states by the end of the day her feet and ankles are very swollen and painful--pt is going to decrease amlodipine to 5mg  daily--she will monitor her BP and if her BP is consistently above 140/90 she will call me--she also notes increased fatigue and lethargy  since increase metoprolol to100mg  two times a day on 11/15/10 when lisinopril was stopped --she is willing to  continue this dose for now    New/Updated Medications: AMLODIPINE BESYLATE 5 MG TABS (AMLODIPINE BESYLATE) one daily   Current Medications (verified): 1)  Levothroid 75 Mcg Tabs (Levothyroxine Sodium) .... Once Daily 2)  Metoprolol Succinate 100 Mg Xr24h-Tab (Metoprolol Succinate) .Marland Kitchen.. 1 Two Times A Day 3)  Micardis 80 Mg Tabs (Telmisartan) .... Take One Tablet Once Daily 4)  Lipitor 40 Mg Tabs (Atorvastatin Calcium) .... Take 1 Tablet By Mouth Once A Day 5)  Probenecid 500 Mg Tabs (Probenecid) .... 4  By Mouth Daily 6)  Prednisone 5 Mg Tabs (Prednisone) .... Once Daily As Needed For Gout Flare. Reduce To 1/2 Tablet 7)  Tramadol Hcl 50 Mg Tabs (Tramadol Hcl) .... One Tablet As Needed 8)  Aspirin 81 Mg Tabs (Aspirin) .... Once Daily 9)  Warfarin Sodium 5 Mg Tabs (Warfarin Sodium) .... Take One and 1/2 Tablet Every Day 10)  Fish Oil 400mg  .... Take 1 Capsule By Mouth Once A Day 11)  Amlodipine  Besylate 5 Mg Tabs (Amlodipine Besylate) .... One Daily 12)  Hydrochlorothiazide 50 Mg Tabs (Hydrochlorothiazide) .... One Daily 13)  Klor-Con M20 20 Meq Cr-Tabs (Potassium Chloride Crys Cr) .... One Daily 14)  Proair Hfa 108 (90 Base) Mcg/act Aers (Albuterol Sulfate) .Marland Kitchen.. 1 Puff Every 4-6 Hours As Needed 15)  Oxygen 2l/min .... Wear 24 / 7  Allergies: 1)  ! Allopurinol 2)  ! Macrobid 3)  ! Keflex 4)  ! Sulfa I will forward to Dr Shirlee Latch for review

## 2010-12-03 NOTE — Assessment & Plan Note (Signed)
Summary: 2 week follow up COPD / JJ - needs cxr   Visit Type:  Follow-up Primary Provider/Referring Provider:  Georgette Shell, PA, Dr. Kellie Chambers  CC:  Pt here for follow-up appt. Pt c/o dry cough and wants to discuss coming off lisinopril. Karen Chambers  History of Present Illness: 65 year old obese female, ex-25 pack smoker, Von Willebrand disease, RA (on chronic prednisone since summer 2011, intolerant to few weeks of methotrexate summer 2011), Gout. Used to be followed by Dr Karen Chambers in 2005 -2006 for Gold Stage 2 COPD (fev1 1.4L/61%, DLCO 74% in aug 2005) and  small pulmonary  nodules that were stable.  October 17, 2010>> Baseline health till a week ago. Felt a tickle on throat 1 week ago. On 10/14/2010 started wheezing but went to work. Later that night increased wheeze and developed wet cough with mild yellow sputum. On tuesday 10/15/2010 [facility]  Pulse ox was 91% (states that baseline she has 'low pulse ox') , CXR reportedly showed pneumonia. Started on doxycycline, mucinex. She followed up today at [facility]. Was subjectively not better. Pulse ox was low in the 70-80s and then referred here. Currently still coughing, wheezing, not resting well, general fatigue, yellow sputum. She feels overall very mild improvement since starting doxy 2 days ago - wheezing less, coughing less and reduction in volume of sputum.  Of note, she rRecollects similar illness in 2004 when she came off cruise ship and got ill and had low pulse ox and was Rx with abx. AT that time she was on home o2 for few months and was discontinued because she reportedly was better.  REcollects PFTs - results she does not know - I have ordered this for review  October 24, 2010--Presents for 1 week follow up for PNA. States breathing has improved.  Still having prod cough with clear mucus, "a little" wheezing and still gets tired easily. Overall she is doing much better. Appetite has improved. She has less dyspnea and cough. Wearing O2 24/7.  On note she is on ACE inhibitor w/ slight dry cough since starting.Denies chest pain,  orthopnea, hemoptysis, fever, n/v/d, edema, headache,. REC: Finish ABX and PRED BURST  Nov 12, 2010. Followup AECOPD, PNA. Doing well overall. Acute symptoms resolved. She is having residual dry cough. She believes this is from ACE inhibitor and wants to come off it. After this visit, I d/w Dr Marca Ancona and hie is ok with dc of ace inhibitor. She is concerned about opthal effects while on ICS and have advised regular fu with her opthalmologist MD.   Preventive Screening-Counseling & Management  Alcohol-Tobacco     Smoking Status: quit     Packs/Day: 1.0     Year Started: 1966     Year Quit: 2000     Pack years: 25  Current Medications (verified): 1)  Levothroid 75 Mcg Tabs (Levothyroxine Sodium) .... Once Daily 2)  Metoprolol Succinate 50 Mg Xr24h-Tab (Metoprolol Succinate) .... Take One Tablet in The Morning 3)  Metoprolol Succinate 100 Mg Xr24h-Tab (Metoprolol Succinate) .... Take One Tablet in The Evening 4)  Micardis 80 Mg Tabs (Telmisartan) .... Take One Tablet Once Daily 5)  Lipitor 40 Mg Tabs (Atorvastatin Calcium) .... Take 1 Tablet By Mouth Once A Day 6)  Probenecid 500 Mg Tabs (Probenecid) .... 4  By Mouth Daily 7)  Prednisone 5 Mg Tabs (Prednisone) .... Once Daily As Needed For Gout Flare. Reduce To 1/2 Tablet 8)  Tramadol Hcl 50 Mg Tabs (  Tramadol Hcl) .... One Tablet As Needed 9)  Aspirin 81 Mg Tabs (Aspirin) .... Once Daily 10)  Warfarin Sodium 5 Mg Tabs (Warfarin Sodium) .... Take One and 1/2 Tablet Every Day 11)  Fish Oil 400mg  .... Take 1 Capsule By Mouth Once A Day 12)  Lisinopril 40 Mg Tabs (Lisinopril) .... One By Mouth Daily 13)  Amlodipine Besylate 5 Mg Tabs (Amlodipine Besylate) .... One By Mouth Daily 14)  Hydrochlorothiazide 50 Mg Tabs (Hydrochlorothiazide) .... One Daily 15)  Klor-Con M20 20 Meq Cr-Tabs (Potassium Chloride Crys Cr) .... One Daily 16)  Proair Hfa 108 (90  Base) Mcg/act Aers (Albuterol Sulfate) .Karen Chambers.. 1 Puff Every 4-6 Hours As Needed 17)  Symbicort 80-4.5 Mcg/act  Aero (Budesonide-Formoterol Fumarate) .... Two Puffs Twice Daily 18)  Oxygen 2l/min .... Wear 24 / 7  Allergies (verified): 1)  ! Allopurinol 2)  ! Macrobid 3)  ! Keflex 4)  ! Sulfa  Past History:  Past medical, surgical, family and social histories (including risk factors) reviewed, and no changes noted (except as noted below).  Past Medical History: Reviewed history from 08/13/2010 and no changes required. 1. Hyperlipidemia 2. HTN: renal artery ultrasound (10/11) with no renal artery stenosis.  3. Hypothyroidism 4. History of UTIs 5. 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  Followed by Dr. Darnelle Chambers.  On permanent warfarin. 6. Obesity 7. Gout: questionable.  Uric acid normal when checked in 10/11.  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 Echo (8/11): moderate focal basal septal hypertrophy, EF 55-60%, no LV outflow tract gradient, grade I diastolic dysfunction, no regional wall motion abnormalities, no systolic anteiror motion of the mitral valve, PA systolic pressure 30 mmHg.  10. Low back pain 11. Rheumatoid arthritis on MTX  Past Surgical History: Reviewed history from 10/17/2010 and no changes required. Cholecystectomy Total Abdominal Hysterectomy  Family History: Reviewed history from 10/17/2010 and no changes required. Mother with "enlarged heart," probably died of MI at age 72.   Father passed away young from cancer.   Social History: Reviewed history from 10/17/2010 and no changes required. 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  Started in 1964, 1ppd. . Has children.   Review of Systems       The patient complains of non-productive cough.  The patient denies shortness of breath with activity, shortness of breath  at rest, productive cough, coughing up blood, chest pain, irregular heartbeats, acid heartburn, indigestion, loss of appetite, weight change, abdominal pain, difficulty swallowing, sore throat, tooth/dental problems, headaches, nasal congestion/difficulty breathing through nose, sneezing, itching, ear ache, anxiety, depression, hand/feet swelling, joint stiffness or pain, rash, change in color of mucus, and fever.    Vital Signs:  Patient profile:   65 year old female Height:      63.5 inches Weight:      200 pounds BMI:     35.00 O2 Sat:      92 % on 2 L/min Temp:     97.6 degrees F oral Pulse rate:   105 / minute BP sitting:   180 / 80  (left arm) Cuff size:   large  Vitals Entered By: Carron Curie CMA (November 12, 2010 1:54 PM)  O2 Flow:  2 L/min  Serial Vital Signs/Assessments:  Comments: Ambulatory Pulse Oximetry  Resting; HR__99___    02 Sat___94__  Lap1 (185 feet)   HR__111___   02 Sat__86___ Lap2 (185 feet)  HR_____   02 Sat_____    Lap3 (185 feet)   HR_____   02 Sat_____  ___Test Completed without Difficulty __x_Test Stopped due to: Pt desaturated to 86% on room air. Placed pt on 2 liters and sats increased to 92% Walked pt 90 feet on 2 liters and sats maintained at 92%.    By: Carron Curie CMA   CC: Pt here for follow-up appt. Pt c/o dry cough and wants to discuss coming off lisinopril.  Comments Medications reviewed with patient Carron Curie CMA  November 12, 2010 2:02 PM Daytime phone number verified with patient.    Physical Exam  General:  well developed, well nourished, in no acute distressobese.   Head:  normocephalic and atraumatic Eyes:  PERRLA/EOM intact; conjunctiva and sclera clear Ears:  TMs intact and clear with normal canals Nose:  no deformity, discharge, inflammation, or lesions Mouth:  no deformity or lesions uvula area mildly red Melampatti Class IV.   Neck:  no masses, thyromegaly, or abnormal cervical nodes Chest  Wall:  no deformities noted Lungs:  decreased BS on R and rhonchi bilateral.   no distress full sentences no acc muscle use no cyanosis  Heart:  regular rate and rhythm, S1, S2 without murmurs, rubs, gallops, or clicks Abdomen:  bowel sounds positive; abdomen soft and non-tender without masses, or organomegaly Msk:  no deformity or scoliosis noted with normal posture Pulses:  pulses normal Extremities:  no clubbing, cyanosis, edema, or deformity noted Neurologic:  CN II-XII grossly intact with normal reflexes, coordination, muscle strength and tone Skin:  intact without lesions or rashes Cervical Nodes:  no significant adenopathy Axillary Nodes:  no significant adenopathy Psych:  alert and cooperative; normal mood and affect; normal attention span and concentration   Impression & Recommendations:  Problem # 1:  PNEUMONIA, ORGANISM UNSPECIFIED (ICD-486) Assessment Improved cxr today shows clearance of pna plan no further fu The following medications were removed from the medication list:    Doxycycline Hyclate 100 Mg Tabs (Doxycycline hyclate) .Karen Chambers... Take 1 tablet by mouth two times a day x 10 days  Orders: T-2 View CXR (71020TC) Pulmonary Referral (Pulmonary) Est. Patient Level IV (16109)  Problem # 2:  CHRONIC OBSTRUCTIVE PULMONARY DISEASE, ACUTE EXACERBATION (ICD-491.21) Assessment: Improved resolved Orders: Rehabilitation Referral (Rehab) Pulmonary Referral (Pulmonary) Est. Patient Level IV (60454)  Problem # 3:  C O P D (ICD-496) Assessment: Unchanged gold stage 2 as of 2005  plan  - you need repeoat breathing tests  - you need to wear your oxygen for walking more than 50-100 feet and at night  - continue symbicort or our dulera samples - take 2 samples, discount card with you - have full breathing test - last done in 2005 - at next visit will check blood test called alpha 1 for genetic cause of copd - make sure you visit with your eye doctor on regular basis  when you are on symbicort - I will efer you to pulmonary rehab  - rov  4-5 months with pft or sooner if there is a problem  Problem # 4:  COUGH (ICD-786.2) Assessment: Unchanged  I will dw Dr Shirlee Latch and recommend dc of lisinopril and substiution mds for BP control  Orders: Est. Patient Level IV (09811)  Medications Added to Medication List This Visit: 1)  Probenecid 500 Mg Tabs (Probenecid) .... 4  by mouth daily 2)  Fish Oil 400mg   .... Take 1 capsule by mouth once a day  Patient Instructions: 1)  #  Pneumoniia 2)   - glad you are better 3)   - i am waiting on official report of xray - will call you wiht reuslts 4)  #COUGH 5)    - will touch base with Dr Shirlee Latch about stpping lisinopril 6)  #COPD 7)   - you need repeoat breathing tests 8)   - you need to wear your oxygen for walking more than 50-100 feet and at night 9)   - continue symbicort or our dulera samples - take 2 samples, discount card with you 10)  - have full breathing test - last done in 2005 11)  - at next visit will check blood test called alpha 1 for genetic cause of copd 12)  - make sure you visit with your eye doctor on regular basis when you are on symbicort 13)  - I will efer you to pulmonary rehab 14)  #FOLLOWUP 15)   - 4-5 months with pft or sooner if there is a problem

## 2010-12-06 ENCOUNTER — Telehealth (INDEPENDENT_AMBULATORY_CARE_PROVIDER_SITE_OTHER): Payer: Self-pay | Admitting: *Deleted

## 2010-12-10 ENCOUNTER — Telehealth: Payer: Self-pay | Admitting: Internal Medicine

## 2010-12-10 NOTE — Telephone Encounter (Signed)
Spoke with pt to inform that per MR- okay for her to take the leflunomide.  Pt verbalized understanding.

## 2010-12-19 NOTE — Progress Notes (Signed)
Summary: meds  Phone Note Call from Patient   Caller: Patient Call For: ramaswamy Summary of Call: pt wants to know if the LESUNOMIDE that she is on for rhematoid arthritis is ok as re: to COPD. call [phone]. also says dr Kellie Simmering did not get a copy of her last rov and tests.  Initial call taken by: Tivis Ringer, CNA,  December 06, 2010 10:06 AM  Follow-up for Phone Call        spoke with pt and she staets Dr. Kellie Simmering wants her to start taking Arava 20mg  (Leflunomide) daily for Von Willebrand disease. Pt wants to make sure MR is ok with her Orland Jarred gthis medication and that it will not interfere with her COPD. Please advsie. Carron Curie CMA  December 06, 2010 10:29 AM   Additional Follow-up for Phone Call Additional follow up Details #1::        yes, ok to take. standard risk for leflunomide apply. Additional Follow-up by: Kalman Shan MD,  December 09, 2010 5:12 PM    Additional Follow-up for Phone Call Additional follow up Details #2::    called and spoke with pt and she is aware per MR that this is an ok med for her to take----standard risk apply---lmomtcb for pt to make her aware [address] [facility]  December 09, 2010 5:18 PM   Additional Follow-up for Phone Call Additional follow up Details #3:: Details for Additional Follow-up Action Taken: Pt was informed of the above. See epic note. Additional Follow-up by: Vernie Murders,  December 10, 2010 1:57 PM

## 2010-12-31 ENCOUNTER — Encounter (HOSPITAL_BASED_OUTPATIENT_CLINIC_OR_DEPARTMENT_OTHER): Payer: PRIVATE HEALTH INSURANCE | Admitting: Oncology

## 2010-12-31 ENCOUNTER — Other Ambulatory Visit: Payer: Self-pay | Admitting: Oncology

## 2010-12-31 DIAGNOSIS — D689 Coagulation defect, unspecified: Secondary | ICD-10-CM

## 2010-12-31 LAB — CBC WITH DIFFERENTIAL/PLATELET
BASO%: 0.8 % (ref 0.0–2.0)
Basophils Absolute: 0.1 10*3/uL (ref 0.0–0.1)
EOS%: 2.3 % (ref 0.0–7.0)
HGB: 12.6 g/dL (ref 11.6–15.9)
MCH: 28.2 pg (ref 25.1–34.0)
MCHC: 32 g/dL (ref 31.5–36.0)
RDW: 15.2 % — ABNORMAL HIGH (ref 11.2–14.5)
lymph#: 1.5 10*3/uL (ref 0.9–3.3)

## 2010-12-31 LAB — PROTIME-INR: INR: 3.2 (ref 2.00–3.50)

## 2011-01-30 ENCOUNTER — Other Ambulatory Visit: Payer: Self-pay | Admitting: Oncology

## 2011-01-30 ENCOUNTER — Encounter (HOSPITAL_BASED_OUTPATIENT_CLINIC_OR_DEPARTMENT_OTHER): Payer: PRIVATE HEALTH INSURANCE | Admitting: Oncology

## 2011-01-30 DIAGNOSIS — Z7901 Long term (current) use of anticoagulants: Secondary | ICD-10-CM

## 2011-01-30 DIAGNOSIS — D6859 Other primary thrombophilia: Secondary | ICD-10-CM

## 2011-01-30 DIAGNOSIS — D689 Coagulation defect, unspecified: Secondary | ICD-10-CM

## 2011-01-30 DIAGNOSIS — Z7982 Long term (current) use of aspirin: Secondary | ICD-10-CM

## 2011-01-30 LAB — CBC WITH DIFFERENTIAL/PLATELET
Basophils Absolute: 0 10*3/uL (ref 0.0–0.1)
Eosinophils Absolute: 0.2 10*3/uL (ref 0.0–0.5)
HGB: 11.9 g/dL (ref 11.6–15.9)
NEUT#: 5.6 10*3/uL (ref 1.5–6.5)
RDW: 15.3 % — ABNORMAL HIGH (ref 11.2–14.5)
WBC: 8.3 10*3/uL (ref 3.9–10.3)
lymph#: 1.8 10*3/uL (ref 0.9–3.3)

## 2011-01-30 LAB — PROTIME-INR: INR: 3.4 (ref 2.00–3.50)

## 2011-02-04 ENCOUNTER — Other Ambulatory Visit: Payer: Self-pay | Admitting: Internal Medicine

## 2011-02-07 ENCOUNTER — Telehealth: Payer: Self-pay | Admitting: Internal Medicine

## 2011-02-07 MED ORDER — BUDESONIDE-FORMOTEROL FUMARATE 80-4.5 MCG/ACT IN AERO: 2.0000 | INHALATION_SPRAY | Freq: Two times a day (BID) | RESPIRATORY_TRACT | Status: DC

## 2011-02-07 NOTE — Telephone Encounter (Signed)
Spoke w/ pt and she states she needs refill on her symbicort 80. I advised pt it was not on her med list and she states at her last OV he told her she could continue on the symbicort. Look at OV note and it states she can continue w/ symbicort. Pt aware rx was sent to pharmacy

## 2011-02-07 NOTE — Discharge Summary (Signed)
[name], Karen Chambers                          ACCOUNT NO.:  [id number]   MEDICAL RECORD NO.:  [id number]                   PATIENT TYPE:  INP   LOCATION:  0379                                 FACILITY:  [facility]   PHYSICIAN:  Corinna L. Lendell Caprice, MD             DATE OF BIRTH:  Mar 03, 1946   DATE OF ADMISSION:  08/03/2003  DATE OF DISCHARGE:  08/05/2003                                 DISCHARGE SUMMARY   DIAGNOSES:  1. Acute bronchitis with bronchospasm and hypoxemia, suspect chronic     obstructive pulmonary disease.  2. Steroid induced hyperglycemia.  3. Hypertension.  4. Von Willebrand's disease.  5. Chronic urinary tract infections.  6. Hypothyroidism.   DISCHARGE MEDICATIONS:  1. Combivent MDI two puffs q.4h. p.r.n. shortness of breath.  2. Levoxyl 25 mg p.o. daily.  3. Avapro 150 mg p.o. daily.  4. Toprol XL 50 mg p.o. daily.  5. Aspirin 325 mg p.o. daily.  6. She is to hold her Coumadin until Wednesday of this week which would be     four days and then start at a lower dose of 5 mg p.o. daily.  7. She may resume Macrodantin 50 mg p.o. daily after Avelox is done.  8. Avelox 400 mg p.o. daily for five more days.  9. Prednisone taper.   FOLLOWUP:  Follow up with her primary care physician next week.  She also  needs to follow up with hematology/oncology for repeat INR in a week and a  half.   CONDITION ON DISCHARGE:  Stable.   ACTIVITY:  No heavy exertion for a week.   DIET:  Regular.   LABORATORIES:  Her basic metabolic panel on admission was significant for a  glucose of 300, otherwise normal.  Her CBC was normal.  Her INR was 4.2 and  the day of discharge was 5.9 but patient had no bleeding.  Chest x-ray  showed no pneumonia.   HISTORY AND HOSPITAL COURSE:  [name] is a 65 year old white female with a  history of remote tobacco abuse who presented to the emergency room with  cough for about a week, hoarseness, shortness of breath, dyspnea on  exertion.  She  also had oxygen saturations of 88% on room air, a heart rate  of 122, respiratory rate of 26.  Blood pressure was okay.  She had initially  expiratory wheezing.  The patient was admitted to the floor, started on  oxygen, IV steroids, hand-held nebulizers, Avelox.  She initially was put on  observation and subsequently changed to a regular admission.  At the time of  discharge she was still slightly hypoxic, but clinically was looking much  better and able to speak without difficulty, ambulate the halls without  oxygen, and was requesting to go home.  Her INR  was high when she came in and her Coumadin was held.  Despite this her INR  still went up slight.  I have asked her not to take her Coumadin for several  days and then restart at a lower dose and check a repeat INR in a week and a  half.                                               Corinna L. Lendell Caprice, MD    CLS/MEDQ  D:  08/05/2003  T:  08/05/2003  Job:  [id number]

## 2011-02-07 NOTE — H&P (Signed)
Karen Chambers, Karen Chambers                          ACCOUNT NO.:  [id number]   MEDICAL RECORD NO.:  [id number]                   PATIENT TYPE:  EMS   LOCATION:  ED                                   FACILITY:  [facility]   PHYSICIAN:  Hollice Espy, M.D.            DATE OF BIRTH:  03/04/1946   DATE OF ADMISSION:  08/03/2003  DATE OF DISCHARGE:                                HISTORY & PHYSICAL   PRIMARY CARE PHYSICIAN:  [facility] in [place].   CHIEF COMPLAINT:  Shortness of breath.   HISTORY OF PRESENT ILLNESS:  This is a 65 year old white female with a past  medical history of 25-35 pack year tobacco use who quit five years ago along  with hypertension and Von Willebrand disease who presents with an almost one  week history of progressive shortness of breath and dyspnea on exertion. The  patient has done previously well with no respiratory problems or complaints.  She was on a cruise ship when she started complaining of some congestion  which progressed to sore throat and laryngitis and then quickly progressed  to shortness of breath and dyspnea on exertion progressively getting worse  every day. She finally went to her PCP at [facility] in  [place] today and did a chest x-ray which showed no evidence of  pneumonia but she was wheezy throughout and O2 sat was 88% on room air. The  patient was recommended that she go then to the ER to be admitted. In the  ER, she was found to be wheezing and she noted O2 sat again of 88% on room  air. She was given oxygen, breathing treatments and IV steroids. She felt  much improved but she still felt shortness of breath. A BNP was ordered  which was negative. Her O2 sat was 92 to 93% currently and she had no  increase in white blood cell count.  Otherwise she is doing well. She feels  slightly agitated from the breathing treatments and feels palpitations but  she denies any chest pain. She complains of shortness of  breath, wheeze and  nonproductive cough. She denies any subjective fevers or chills. She denies  any headache, vision changes, dysphagia or hearing loss. She denies any  abdominal pain, extremity pain. Overall she does feel fatigued and weak. She  denies any hematuria, dysuria, constipation or diarrhea.   PAST MEDICAL HISTORY:  1. Hypertension.  2. She has a history of prolapsed bladder and uterus and is on chronic     antibiotics for UTI suppression. She has a history of TIA and this was     found to be secondary to Von Willebrand disease and she is on chronic     anticoagulation.  3. History of hypothyroidism.  4. No history of diabetes.   MEDICATIONS:  1. Levoxyl 25 daily.  2. Maxzide 25 daily.  3. Bextra 20 daily.  4. Avapro  150 daily.  5. Toprol 50 daily.  6. Aspirin 325 daily.  7. Coumadin 7.5 mg p.o. daily.  8. Macrodantin 50 mg p.o. daily.   ALLERGIES:  She is allergic to SULFA and KEFLEX.   SOCIAL HISTORY:  A 25-35 pack year history, quit five years ago. She denies  any heavy alcohol or drug use.   FAMILY HISTORY:  Congestive heart failure.   PHYSICAL EXAMINATION:  VITAL SIGNS:  Her vitals on admission, temperature  98.1, blood pressure 154/95, heart rate 122, respirations 26, O2 saturations  98% on room air. Currently on 2 liters, she is sating 94%. Her blood  pressure is 147/95, pulse 119, respirations 22, She is afebrile.  GENERAL:  She appears to be slightly agitated. She says she feels jumpy from  the breathing treatment but overall is in otherwise no apparent distress.  She is alert and oriented x3 and she is pleasant.  HEENT:  Normocephalic, atraumatic. Mucous membranes are dry. She has no  carotid bruits.  HEART:  Regular rhythm with an occasional what appears to be ectopic atrial  beat. She has mild tachycardia as well.  LUNGS:  On previous reports and when she was admitted she had expiratory  wheezing throughout. She has actually very minimal wheezing  with actually  more of a prolonged airway expiration and a few scattered rhonchi otherwise  good.  ABDOMEN:  Soft, nontender. She is obese, positive bowel sounds.  EXTREMITIES:  She has no cyanosis, clubbing or edema.  NEUROLOGIC:  No focal neurological deficits.   LABORATORY DATA:  Her white count is 7.1 with an 85% shift, H&H is 14.4 and  42, MCV of 83, platelet count 186.  Sodium 132, potassium 4.3, chloride 98,  bicarb 23, BUN 20, creatinine 1.3, glucose 300, calcium 8.9.  CPK and MB is  121 and 4.8 with a troponin I of 0.01. Her BNP is less than 30.   Chest x-ray which she brought from an outside hospital showed no evidence of  infiltrate.   ASSESSMENT/PLAN:  A 65 year old white female with a history of acute  shortness of breath and wheezing.  1. Chronic obstructive pulmonary disease exacerbation versus asthma     exacerbation versus early atypical pneumonia. I actually suspect that     this chronic obstructive pulmonary disease given her history and it is     less likely pneumonia with a negative white count and negative chest x-     ray. I put her on IV steroids, nebs, O2 and IV Avelox. We will check a     chest x-ray in the a.m. plus a CBC and also give IV fluids x2 liters for     rehydration. I do not think this is a congestive heart failure     exacerbation as her BNP is negative and she shows no evidence of pleural     effusions bilaterally. Once she is stabilized as an outpatient will     probably need to determine if she does indeed have chronic obstructive     pulmonary disease and outpatient pulmonary function tests are     recommended.  2. Hypertension.  Will continue all of her medications including her Toprol     realizing the beta blocker may be not be the best given an asthmatic case     but she has no previous history of asthma. This may be chronic     obstructive pulmonary disease plus the fact that she is tachycardic from  her breathing treatments, so I am  going to have to put her on a telemetry     bed and perhaps the Toprol may help this as well.  3. History of chronic antibiotics secondary to urinary tract infection     suppression. Will continue her Macrodantin.  4. History of transient ischemic attack from Von Willebrand disease on     chronic anticoagulation. Will check a PT and INR and go ahead and     continue her Coumadin and aspirin.  5. Hypothyroidism. Continue Levoxyl.  6. She had some mild renal insufficiency with creatinine of 1.3.  Some of     this may be from dehydration so we are going to go ahead and replace some     fluids but she may have some underlying renal insufficiency from     hypertension.  7. Increased BGM. She was noticed to have a blood sugar of 300. Looking at     the time stamps, I cannot tell if she was given multiple doses of     dexamethasone first and then had her blood sugars checked or vice versa,     so will     need to go ahead and check an A1C to completely rule out diabetes and put     her on a diabetic diet at least for now. The patient has no medical     history of diabetes. Will also cover with a sliding scale while she is     here just to treat her high blood sugars.                                               Hollice Espy, M.D.    SKK/MEDQ  D:  08/03/2003  T:  08/03/2003  Job:  [id number]   cc:   [facility], Kathryne Sharper

## 2011-02-28 ENCOUNTER — Encounter: Payer: Self-pay | Admitting: Internal Medicine

## 2011-03-03 ENCOUNTER — Encounter: Payer: Self-pay | Admitting: Internal Medicine

## 2011-03-03 ENCOUNTER — Ambulatory Visit (INDEPENDENT_AMBULATORY_CARE_PROVIDER_SITE_OTHER): Payer: PRIVATE HEALTH INSURANCE | Admitting: Internal Medicine

## 2011-03-03 VITALS — BP 138/80 | HR 95 | Temp 99.3°F | Ht 64.0 in | Wt 202.0 lb

## 2011-03-03 DIAGNOSIS — R0902 Hypoxemia: Secondary | ICD-10-CM

## 2011-03-03 DIAGNOSIS — R059 Cough, unspecified: Secondary | ICD-10-CM

## 2011-03-03 DIAGNOSIS — J449 Chronic obstructive pulmonary disease, unspecified: Secondary | ICD-10-CM

## 2011-03-03 DIAGNOSIS — R05 Cough: Secondary | ICD-10-CM

## 2011-03-03 DIAGNOSIS — J4489 Other specified chronic obstructive pulmonary disease: Secondary | ICD-10-CM

## 2011-03-03 LAB — PULMONARY FUNCTION TEST

## 2011-03-03 NOTE — Progress Notes (Signed)
  Subjective:    Patient ID: Karen Chambers, female    DOB: 06-09-1946, 65 y.o.   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 Gold Stage 2 copd (followed by Dr. Maple Hudson 2005-2006 and Dr. Marchelle Gearing Jan 2012 - date). Baseline PFt Aug 2005 - fev1 1.4L/61%, DLCO 74%. Baseline desaturation to 86% walking 185 feet in feb 2012  OV 03/03/2011: Cough resolved after we changed ACE inhibitor to ARB at last ov in Nov 12, 2010. Mild class 2-3 dyspnea only. Compliant with o2 at night. Using o2 prn at daytime depending on symptoms. Mild chronic sinus drainage +. REports cost issue with symbicort $100/month. Also, o2 cost is $400/month and is asking if she should invest in permanent non-refillable o2 machine -  I have agreed with it. No new issues. PFTS today 03/03/2011 - FEv1 1.2L/57%, no bd respoinse. dLCO 14.5/60%. This is a 200cc drop in 7 yeears which appears age related.   Review of Systems  Constitutional: Positive for unexpected weight change. Negative for fever.  HENT: Positive for nosebleeds, sneezing, postnasal drip and sinus pressure. Negative for ear pain, congestion, sore throat, rhinorrhea, trouble swallowing and dental problem.   Eyes: Positive for redness and itching.  Respiratory: Positive for cough, shortness of breath and wheezing. Negative for chest tightness.   Cardiovascular: Positive for leg swelling. Negative for palpitations.  Gastrointestinal: Negative for nausea and vomiting.  Genitourinary: Negative for dysuria.  Musculoskeletal: Positive for joint swelling.  Skin: Negative for rash.  Neurological: Negative for headaches.  Hematological: Bruises/bleeds easily.  Psychiatric/Behavioral: Negative for dysphoric  mood. The patient is nervous/anxious.        Objective:   Physical Exam        Assessment & Plan:

## 2011-03-03 NOTE — Patient Instructions (Signed)
Please take 3 samples of spiriva once daily, take discount card and prescription toon Stop the symbicort and give yourself a trial on spiriva for 6-8 weeks REturn to see me in 6-8 weeks to report progres My nurse will walk  You again today for oxygen levels and also check alpha 1 genetic test for copd You can consider investing in the permanent oxygen system

## 2011-03-03 NOTE — Progress Notes (Signed)
PFT done today. 

## 2011-03-03 NOTE — Progress Notes (Signed)
SATURATION QUALIFICATIONS:  Patient Saturations on Room Air at Rest = 93%  Patient Saturations on Room Air while Ambulating = 87%  Patient Saturations on 2 Liters of oxygen while Ambulating = 93%

## 2011-03-04 ENCOUNTER — Other Ambulatory Visit: Payer: Self-pay | Admitting: Oncology

## 2011-03-04 ENCOUNTER — Encounter: Payer: PRIVATE HEALTH INSURANCE | Admitting: Oncology

## 2011-03-04 LAB — CBC WITH DIFFERENTIAL/PLATELET
BASO%: 0.5 % (ref 0.0–2.0)
HCT: 37.1 % (ref 34.8–46.6)
LYMPH%: 19.8 % (ref 14.0–49.7)
MCHC: 31.5 g/dL (ref 31.5–36.0)
MCV: 86.1 fL (ref 79.5–101.0)
MONO#: 0.7 10*3/uL (ref 0.1–0.9)
MONO%: 10 % (ref 0.0–14.0)
NEUT%: 66.8 % (ref 38.4–76.8)
Platelets: 171 10*3/uL (ref 145–400)
RBC: 4.31 10*6/uL (ref 3.70–5.45)
WBC: 7.3 10*3/uL (ref 3.9–10.3)
nRBC: 0 % (ref 0–0)

## 2011-03-04 LAB — PROTIME-INR

## 2011-03-04 NOTE — Assessment & Plan Note (Signed)
Add ace inhibitor in allergy list

## 2011-03-04 NOTE — Assessment & Plan Note (Signed)
Stable gold stage 2 disease. AGe related decline in fev1. Exertional hypoxemia. Cost issues with o2 and symbicort. On chronic prednisone for RA  PLAN Please take 3 samples of spiriva once daily, take discount card and prescription toon Stop the symbicort and give yourself a trial on spiriva for 6-8 weeks; because she is already on oral steroids REturn to see me in 6-8 weeks to report progres My nurse will walk  You again today for oxygen levels and also check alpha 1 genetic test for copd You can consider investing in the permanent oxygen system

## 2011-03-06 ENCOUNTER — Encounter: Payer: Self-pay | Admitting: Internal Medicine

## 2011-03-24 ENCOUNTER — Telehealth: Payer: Self-pay | Admitting: Cardiology

## 2011-03-24 MED ORDER — METOPROLOL SUCCINATE ER 100 MG PO TB24: 100.0000 mg | ORAL_TABLET | Freq: Two times a day (BID) | ORAL | Status: DC

## 2011-03-24 NOTE — Telephone Encounter (Signed)
Pt needs Metoprolol 100mg  #6 called into [facility] [place]  [phone]- pt out of town and for got to bring it-pls all pt when called in [phone]

## 2011-03-24 NOTE — Telephone Encounter (Signed)
Per policy we have a 48 hr time limit to get refills done. I will give pt a refill on her medication

## 2011-03-24 NOTE — Telephone Encounter (Signed)
Pt needs Metorprolo 100mg  six pills called into [facility] in [place] [phone]. Pt out of town and forgot evening pills. This is second time pt has called today.

## 2011-03-31 ENCOUNTER — Encounter: Payer: Self-pay | Admitting: Internal Medicine

## 2011-03-31 ENCOUNTER — Telehealth: Payer: Self-pay | Admitting: Internal Medicine

## 2011-03-31 MED ORDER — TIOTROPIUM BROMIDE MONOHYDRATE 18 MCG IN CAPS: 18.0000 ug | ORAL_CAPSULE | Freq: Every day | RESPIRATORY_TRACT | Status: DC

## 2011-03-31 NOTE — Telephone Encounter (Signed)
Patient Instructions     Please take 3 samples of spiriva once daily, take discount card and prescription toon  Stop the symbicort and give yourself a trial on spiriva for 6-8 weeks  REturn to see me in 6-8 weeks to report progres  My nurse will walk You again today for oxygen levels and also check alpha 1 genetic test for copd  You can consider investing in the permanent oxygen system     LMOMTCB

## 2011-03-31 NOTE — Telephone Encounter (Signed)
Spoke with the pt and she states that the spiriva is working well for her and she request an rx. Rx sent. Pt aware and reminded of appt on 04-18-11. Carron Curie, CMA

## 2011-04-01 ENCOUNTER — Other Ambulatory Visit: Payer: Self-pay | Admitting: Oncology

## 2011-04-01 ENCOUNTER — Encounter (HOSPITAL_BASED_OUTPATIENT_CLINIC_OR_DEPARTMENT_OTHER): Payer: PRIVATE HEALTH INSURANCE | Admitting: Oncology

## 2011-04-01 DIAGNOSIS — Z7901 Long term (current) use of anticoagulants: Secondary | ICD-10-CM

## 2011-04-01 DIAGNOSIS — D6859 Other primary thrombophilia: Secondary | ICD-10-CM

## 2011-04-01 DIAGNOSIS — D689 Coagulation defect, unspecified: Secondary | ICD-10-CM

## 2011-04-01 DIAGNOSIS — Z7982 Long term (current) use of aspirin: Secondary | ICD-10-CM

## 2011-04-01 LAB — CBC WITH DIFFERENTIAL/PLATELET
BASO%: 0.3 % (ref 0.0–2.0)
HCT: 36 % (ref 34.8–46.6)
MCHC: 32.2 g/dL (ref 31.5–36.0)
MONO#: 0.5 10*3/uL (ref 0.1–0.9)
NEUT%: 76.4 % (ref 38.4–76.8)
RBC: 4.3 10*6/uL (ref 3.70–5.45)
WBC: 7.4 10*3/uL (ref 3.9–10.3)
lymph#: 1.1 10*3/uL (ref 0.9–3.3)

## 2011-04-01 LAB — PROTIME-INR: INR: 1.6 — ABNORMAL LOW (ref 2.00–3.50)

## 2011-04-03 ENCOUNTER — Telehealth: Payer: Self-pay | Admitting: Internal Medicine

## 2011-04-03 MED ORDER — TIOTROPIUM BROMIDE MONOHYDRATE 18 MCG IN CAPS: 18.0000 ug | ORAL_CAPSULE | Freq: Every day | RESPIRATORY_TRACT | Status: DC

## 2011-04-03 NOTE — Telephone Encounter (Signed)
Pt says [facility] on [address] has not received refills for her Spiriva. Pt aware we sent RX to [facility] in [place] in error and that we will correct the mistake and I apologized for our error. RX sent to the correct pharmacy and [facility] was told to disregard RX that was sent to them.

## 2011-04-11 ENCOUNTER — Other Ambulatory Visit: Payer: Self-pay | Admitting: Oncology

## 2011-04-11 ENCOUNTER — Encounter (HOSPITAL_BASED_OUTPATIENT_CLINIC_OR_DEPARTMENT_OTHER): Payer: PRIVATE HEALTH INSURANCE | Admitting: Oncology

## 2011-04-11 DIAGNOSIS — Z7982 Long term (current) use of aspirin: Secondary | ICD-10-CM

## 2011-04-11 DIAGNOSIS — Z7901 Long term (current) use of anticoagulants: Secondary | ICD-10-CM

## 2011-04-11 DIAGNOSIS — D689 Coagulation defect, unspecified: Secondary | ICD-10-CM

## 2011-04-11 DIAGNOSIS — D6859 Other primary thrombophilia: Secondary | ICD-10-CM

## 2011-04-11 LAB — PROTIME-INR: Protime: 20.4 Seconds — ABNORMAL HIGH (ref 10.6–13.4)

## 2011-04-15 ENCOUNTER — Other Ambulatory Visit: Payer: Self-pay | Admitting: Oncology

## 2011-04-15 ENCOUNTER — Encounter (HOSPITAL_BASED_OUTPATIENT_CLINIC_OR_DEPARTMENT_OTHER): Payer: PRIVATE HEALTH INSURANCE | Admitting: Oncology

## 2011-04-15 DIAGNOSIS — D689 Coagulation defect, unspecified: Secondary | ICD-10-CM

## 2011-04-15 DIAGNOSIS — Z7901 Long term (current) use of anticoagulants: Secondary | ICD-10-CM

## 2011-04-15 DIAGNOSIS — Z7982 Long term (current) use of aspirin: Secondary | ICD-10-CM

## 2011-04-15 DIAGNOSIS — D6859 Other primary thrombophilia: Secondary | ICD-10-CM

## 2011-04-15 LAB — PROTIME-INR: Protime: 25.2 Seconds — ABNORMAL HIGH (ref 10.6–13.4)

## 2011-04-18 ENCOUNTER — Ambulatory Visit (INDEPENDENT_AMBULATORY_CARE_PROVIDER_SITE_OTHER): Payer: PRIVATE HEALTH INSURANCE | Admitting: Internal Medicine

## 2011-04-18 ENCOUNTER — Encounter: Payer: Self-pay | Admitting: Internal Medicine

## 2011-04-18 VITALS — BP 148/84 | HR 96 | Temp 98.5°F | Ht 63.5 in | Wt 207.6 lb

## 2011-04-18 DIAGNOSIS — J449 Chronic obstructive pulmonary disease, unspecified: Secondary | ICD-10-CM

## 2011-04-18 NOTE — Patient Instructions (Signed)
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

## 2011-04-18 NOTE — Progress Notes (Signed)
Subjective:    Patient ID: Karen Chambers, female    DOB: 20-Dec-1945, 65 y.o.   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 Gold Stage 2 copd (followed by Dr. Maple Hudson 2005-2006 and Dr. Marchelle Gearing Jan 2012 - date). Baseline PFt Aug 2005 - fev1 1.4L/61%, DLCO 74%. Baseline desaturation to 86% walking 185 feet in feb 2012  OV 03/03/2011: Cough resolved after we changed ACE inhibitor to ARB at last ov in Nov 12, 2010. Mild class 2-3 dyspnea only. Compliant with o2 at night. Using o2 prn at daytime depending on symptoms. Mild chronic sinus drainage +. REports cost issue with symbicort $100/month. Also, o2 cost is $400/month and is asking if she should invest in permanent non-refillable o2 machine -  I have agreed with it. No new issues. PFTS today 03/03/2011 - FEv1 1.2L/57%, no bd respoinse. dLCO 14.5/60%. This is a 200cc drop in 7 yeears which appears age related. REC: SPIRIVA     OV 03/29/2011: Followup Gold stage 2 copd. STates spiriva working real well for her. IT is more exepnsive by $12 than symbicort but works better so she is happy with it. Overall dyspnea is class 2 exertional class and stable. As before it is relieved by rest.  REcent summer heat and humidity a burden but not in AECOPD. Denies asociated cough, wheeze, edema. Fever, sputum. Uses o2 all the time. Has not invested in permanent systme due to distrust of the internet. 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 Other and unspecified hyperlipidemia 401.9  . Hypothyroidism   . Obesity, unspecified   . Gout   . Low back pain   . Rheumatoid arthritis      Family History  Problem Relation Age of Onset  . Cancer Father    . Heart disease Mother      History   Social History  . Marital Status: Legally Separated    Spouse Name: N/A    Number of Children: N/A  . Years of Education: N/A   Occupational History  . Not on file.   Social History Main Topics  . Smoking status: Former Smoker -- 1.5 packs/day for 36 years    Types: Cigarettes    Quit date: 07/05/1999  . Smokeless tobacco: Never Used   Comment: 1ppd x 36 years  . Alcohol Use: No  . Drug Use: Not on file  . Sexually Active: Not on file   Other Topics Concern  . Not on file   Social History Narrative  . No narrative on file     Allergies  Allergen Reactions  . Ace Inhibitors     cough  . Allopurinol   . Cephalexin   . Nitrofurantoin   . Sulfonamide Derivatives      Outpatient Prescriptions Prior to Visit  Medication Sig Dispense Refill  . amLODipine (NORVASC) 5 MG tablet Take 5 mg by mouth daily.        Marland Kitchen aspirin 81 MG tablet Take 81 mg by mouth daily.        Marland Kitchen atorvastatin (LIPITOR) 40 MG tablet Take 40 mg by mouth daily.        Marland Kitchen  hydrochlorothiazide 50 MG tablet Take 50 mg by mouth daily.        Marland Kitchen levothyroxine (SYNTHROID, LEVOTHROID) 75 MCG tablet Take 75 mcg by mouth daily.        . metoprolol (TOPROL-XL) 100 MG 24 hr tablet Take 1 tablet (100 mg total) by mouth 2 (two) times daily.  10 tablet  1  . Multiple Vitamin (MULTIVITAMIN) capsule Take 1 capsule by mouth daily.        . Omega-3 Fatty Acids (ULTRA OMEGA-3 FISH OIL) 1400 MG CAPS Take 1 tablet by mouth.        . potassium chloride (KLOR-CON) 20 MEQ packet Take 20 mEq by mouth daily.        . predniSONE (DELTASONE) 5 MG tablet Take by mouth every other day. Alternating 5mg  and 10mg       . probenecid (BENEMID) 500 MG tablet 4 by mouth daily       . telmisartan (MICARDIS) 80 MG tablet Take 80 mg by mouth daily.        Marland Kitchen tiotropium (SPIRIVA HANDIHALER) 18 MCG inhalation capsule Place 1 capsule (18 mcg total) into inhaler and inhale daily.  30 capsule  2  . warfarin  (COUMADIN) 5 MG tablet As directed.      Marland Kitchen albuterol (PROAIR HFA) 108 (90 BASE) MCG/ACT inhaler Inhale 2 puffs into the lungs every 6 (six) hours as needed.        . budesonide-formoterol (SYMBICORT) 80-4.5 MCG/ACT inhaler Inhale 2 puffs into the lungs 2 (two) times daily.  1 Inhaler  3        Review of Systems  Constitutional: Negative for fever and unexpected weight change.  HENT: Positive for postnasal drip. Negative for ear pain, nosebleeds, congestion, sore throat, rhinorrhea, sneezing, trouble swallowing, dental problem and sinus pressure.   Eyes: Positive for itching. Negative for redness.  Respiratory: Positive for cough and wheezing. Negative for chest tightness and shortness of breath.   Cardiovascular: Positive for leg swelling. Negative for palpitations.  Gastrointestinal: Negative for nausea and vomiting.  Genitourinary: Negative for dysuria.  Musculoskeletal: Negative for joint swelling.  Skin: Negative for rash.  Neurological: Negative for headaches.  Hematological: Does not bruise/bleed easily.  Psychiatric/Behavioral: Negative for dysphoric mood. The patient is not nervous/anxious.        Objective:   Physical Exam  Vitals reviewed. Constitutional: She is oriented to person, place, and time. She appears well-developed and well-nourished. No distress.       Obese cushingoid  HENT:  Head: Normocephalic and atraumatic.  Right Ear: External ear normal.  Left Ear: External ear normal.  Mouth/Throat: Oropharynx is clear and moist. No oropharyngeal exudate.  Eyes: Conjunctivae and EOM are normal. Pupils are equal, round, and reactive to light. Right eye exhibits no discharge. Left eye exhibits no discharge. No scleral icterus.  Neck: Normal range of motion. Neck supple. No JVD present. No tracheal deviation present. No thyromegaly present.  Cardiovascular: Normal rate, regular rhythm and intact distal pulses.  Exam reveals no gallop and no friction rub.   Murmur  heard.      Baseline murmur systolic soft  Pulmonary/Chest: Effort normal and breath sounds normal. No respiratory distress. She has no wheezes. She has no rales. She exhibits no tenderness.  Abdominal: Soft. Bowel sounds are normal. She exhibits no distension and no mass. There is no tenderness. There is no rebound and no guarding.  Musculoskeletal: Normal range of motion. She exhibits no edema and no tenderness.  Lymphadenopathy:  She has no cervical adenopathy.  Neurological: She is alert and oriented to person, place, and time. She has normal reflexes. No cranial nerve deficit. She exhibits normal muscle tone. Coordination normal.  Skin: Skin is warm and dry. No rash noted. She is not diaphoretic. No erythema. No pallor.  Psychiatric: She has a normal mood and affect. Her behavior is normal. Judgment and thought content normal.          Assessment & Plan:   No problem-specific assessment & plan notes found for this encounter.

## 2011-04-18 NOTE — Assessment & Plan Note (Signed)
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  

## 2011-04-29 ENCOUNTER — Encounter (HOSPITAL_BASED_OUTPATIENT_CLINIC_OR_DEPARTMENT_OTHER): Payer: PRIVATE HEALTH INSURANCE | Admitting: Oncology

## 2011-04-29 ENCOUNTER — Other Ambulatory Visit: Payer: Self-pay | Admitting: Oncology

## 2011-04-29 DIAGNOSIS — D689 Coagulation defect, unspecified: Secondary | ICD-10-CM

## 2011-04-29 DIAGNOSIS — Z7901 Long term (current) use of anticoagulants: Secondary | ICD-10-CM

## 2011-04-29 DIAGNOSIS — Z7982 Long term (current) use of aspirin: Secondary | ICD-10-CM

## 2011-04-29 DIAGNOSIS — D6859 Other primary thrombophilia: Secondary | ICD-10-CM

## 2011-04-29 LAB — CBC WITH DIFFERENTIAL/PLATELET
Eosinophils Absolute: 0 10*3/uL (ref 0.0–0.5)
HCT: 39 % (ref 34.8–46.6)
HGB: 12.3 g/dL (ref 11.6–15.9)
LYMPH%: 10 % — ABNORMAL LOW (ref 14.0–49.7)
MONO#: 0.6 10*3/uL (ref 0.1–0.9)
NEUT#: 6.4 10*3/uL (ref 1.5–6.5)
NEUT%: 82.3 % — ABNORMAL HIGH (ref 38.4–76.8)
Platelets: 135 10*3/uL — ABNORMAL LOW (ref 145–400)
WBC: 7.8 10*3/uL (ref 3.9–10.3)
lymph#: 0.8 10*3/uL — ABNORMAL LOW (ref 0.9–3.3)

## 2011-05-23 ENCOUNTER — Telehealth: Payer: Self-pay | Admitting: Internal Medicine

## 2011-05-23 DIAGNOSIS — J449 Chronic obstructive pulmonary disease, unspecified: Secondary | ICD-10-CM

## 2011-05-23 NOTE — Telephone Encounter (Signed)
NOTE: MAIN QUESTION IS "IS PT STILL ON O2- IF SO, THEN THE ORDER IS NEEDED TO CONTINUE O2 DELIVERY FOR PT. FAX # [phone] ATTN: PAT

## 2011-05-23 NOTE — Telephone Encounter (Signed)
LMTCB

## 2011-05-27 ENCOUNTER — Other Ambulatory Visit: Payer: Self-pay | Admitting: Oncology

## 2011-05-27 ENCOUNTER — Encounter (HOSPITAL_BASED_OUTPATIENT_CLINIC_OR_DEPARTMENT_OTHER): Payer: PRIVATE HEALTH INSURANCE | Admitting: Oncology

## 2011-05-27 DIAGNOSIS — Z7901 Long term (current) use of anticoagulants: Secondary | ICD-10-CM

## 2011-05-27 DIAGNOSIS — Z7982 Long term (current) use of aspirin: Secondary | ICD-10-CM

## 2011-05-27 DIAGNOSIS — D689 Coagulation defect, unspecified: Secondary | ICD-10-CM

## 2011-05-27 DIAGNOSIS — D6859 Other primary thrombophilia: Secondary | ICD-10-CM

## 2011-05-27 LAB — CBC WITH DIFFERENTIAL/PLATELET
Basophils Absolute: 0.1 10*3/uL (ref 0.0–0.1)
EOS%: 2.6 % (ref 0.0–7.0)
Eosinophils Absolute: 0.3 10*3/uL (ref 0.0–0.5)
HGB: 12.4 g/dL (ref 11.6–15.9)
LYMPH%: 16.5 % (ref 14.0–49.7)
MCH: 27 pg (ref 25.1–34.0)
MCV: 83.7 fL (ref 79.5–101.0)
MONO%: 7.9 % (ref 0.0–14.0)
NEUT#: 7 10*3/uL — ABNORMAL HIGH (ref 1.5–6.5)
NEUT%: 72.5 % (ref 38.4–76.8)
Platelets: 170 10*3/uL (ref 145–400)
RDW: 15.6 % — ABNORMAL HIGH (ref 11.2–14.5)

## 2011-05-27 LAB — PROTIME-INR: INR: 4.4 — ABNORMAL HIGH (ref 2.00–3.50)

## 2011-05-27 NOTE — Telephone Encounter (Signed)
LMTCB

## 2011-05-28 NOTE — Telephone Encounter (Signed)
Order was sent to [facility] for o2

## 2011-05-28 NOTE — Telephone Encounter (Signed)
Pat returned call from triage. Wants a new order for O2 or call and let her know if O2 has been d/c'd.

## 2011-06-02 ENCOUNTER — Telehealth: Payer: Self-pay | Admitting: Internal Medicine

## 2011-06-02 DIAGNOSIS — J449 Chronic obstructive pulmonary disease, unspecified: Secondary | ICD-10-CM

## 2011-06-02 NOTE — Telephone Encounter (Signed)
NOTE: CALL PT AT WORK # UNTIL 5 THEN CALL HOME #

## 2011-06-02 NOTE — Telephone Encounter (Signed)
Spoke with pt. She states that she is wanting to purchase o2 concentrator, but does not know if will need to be on o2 continuous flow, or pulsed. Will need new rx for whatever MR recommends. She also needs physicians statement on letterhead stating that she will need to carry o2 on plane next month. Please advise, thanks!

## 2011-06-04 NOTE — Telephone Encounter (Signed)
Called patient  1.she has researched into portable o2 system. The innogen system is pulsed versus another system that is continous. Advised to go to with either depending on what they say. Pulsed - less dryness, more noise.   2. She wants prescription from [place] for o2 system  - please do the needful. After our discussion, She is saying that she wants script for the INNOGEN -1 G2 PORTABLE OXYGEN CONCENTRATOR pulsed 2  Liter nasal cannula. She wants script next 24-48h so it can help with script  3. Traveling by air on 07/03/11 on [place] to [facility]) - needs it signed. Advised her to bring it in so that I or tammy can sign it

## 2011-06-04 NOTE — Telephone Encounter (Signed)
PT CALLED BACK- ASKS THAT MR CALL HER ASAP TODAY RE: SAME- [phone]. Karen Chambers

## 2011-06-06 NOTE — Telephone Encounter (Signed)
Order placed for oxygen. Carron Curie, CMA

## 2011-06-09 ENCOUNTER — Encounter (HOSPITAL_BASED_OUTPATIENT_CLINIC_OR_DEPARTMENT_OTHER): Payer: PRIVATE HEALTH INSURANCE | Admitting: Oncology

## 2011-06-09 ENCOUNTER — Other Ambulatory Visit: Payer: Self-pay | Admitting: Oncology

## 2011-06-09 DIAGNOSIS — Z7901 Long term (current) use of anticoagulants: Secondary | ICD-10-CM

## 2011-06-09 DIAGNOSIS — D689 Coagulation defect, unspecified: Secondary | ICD-10-CM

## 2011-06-09 DIAGNOSIS — Z7982 Long term (current) use of aspirin: Secondary | ICD-10-CM

## 2011-06-09 DIAGNOSIS — D6859 Other primary thrombophilia: Secondary | ICD-10-CM

## 2011-06-10 ENCOUNTER — Telehealth: Payer: Self-pay | Admitting: Internal Medicine

## 2011-06-10 NOTE — Telephone Encounter (Signed)
Candise Bowens, have you seen these forms yet? Pls advise, and if not  I will call and have them refax, thanks!

## 2011-06-10 NOTE — Telephone Encounter (Signed)
Encounter closed in error and sent to [facility]

## 2011-06-12 NOTE — Telephone Encounter (Signed)
I placed the fax in your look-at to complete. Carron Curie, CMA

## 2011-06-13 NOTE — Telephone Encounter (Signed)
Jen, i have left it on side B

## 2011-06-16 NOTE — Telephone Encounter (Signed)
Form mailed and pt aware. Yaritzi Craun, CMA  

## 2011-06-24 ENCOUNTER — Other Ambulatory Visit: Payer: Self-pay | Admitting: Oncology

## 2011-06-24 ENCOUNTER — Encounter (HOSPITAL_BASED_OUTPATIENT_CLINIC_OR_DEPARTMENT_OTHER): Payer: PRIVATE HEALTH INSURANCE | Admitting: Oncology

## 2011-06-24 DIAGNOSIS — Z7982 Long term (current) use of aspirin: Secondary | ICD-10-CM

## 2011-06-24 DIAGNOSIS — D6859 Other primary thrombophilia: Secondary | ICD-10-CM

## 2011-06-24 DIAGNOSIS — D689 Coagulation defect, unspecified: Secondary | ICD-10-CM

## 2011-06-24 DIAGNOSIS — Z7901 Long term (current) use of anticoagulants: Secondary | ICD-10-CM

## 2011-06-24 LAB — PROTIME-INR
INR: 2.5 (ref 2.00–3.50)
Protime: 30 Seconds — ABNORMAL HIGH (ref 10.6–13.4)

## 2011-06-24 LAB — CBC WITH DIFFERENTIAL/PLATELET
Basophils Absolute: 0 10*3/uL (ref 0.0–0.1)
Eosinophils Absolute: 0.3 10*3/uL (ref 0.0–0.5)
HGB: 11.8 g/dL (ref 11.6–15.9)
MONO#: 0.9 10*3/uL (ref 0.1–0.9)
NEUT#: 7 10*3/uL — ABNORMAL HIGH (ref 1.5–6.5)
RBC: 4.46 10*6/uL (ref 3.70–5.45)
RDW: 15.6 % — ABNORMAL HIGH (ref 11.2–14.5)
WBC: 10.3 10*3/uL (ref 3.9–10.3)
lymph#: 2.1 10*3/uL (ref 0.9–3.3)
nRBC: 0 % (ref 0–0)

## 2011-07-07 ENCOUNTER — Other Ambulatory Visit: Payer: Self-pay | Admitting: Internal Medicine

## 2011-07-22 ENCOUNTER — Other Ambulatory Visit: Payer: Self-pay | Admitting: Oncology

## 2011-07-22 ENCOUNTER — Encounter (HOSPITAL_BASED_OUTPATIENT_CLINIC_OR_DEPARTMENT_OTHER): Payer: PRIVATE HEALTH INSURANCE | Admitting: Oncology

## 2011-07-22 DIAGNOSIS — D689 Coagulation defect, unspecified: Secondary | ICD-10-CM

## 2011-07-22 DIAGNOSIS — Z7901 Long term (current) use of anticoagulants: Secondary | ICD-10-CM

## 2011-07-22 DIAGNOSIS — D6859 Other primary thrombophilia: Secondary | ICD-10-CM

## 2011-07-22 DIAGNOSIS — Z7982 Long term (current) use of aspirin: Secondary | ICD-10-CM

## 2011-07-22 LAB — CBC WITH DIFFERENTIAL/PLATELET
Eosinophils Absolute: 0.3 10*3/uL (ref 0.0–0.5)
HCT: 37.5 % (ref 34.8–46.6)
HGB: 11.7 g/dL (ref 11.6–15.9)
LYMPH%: 20.2 % (ref 14.0–49.7)
MONO#: 0.8 10*3/uL (ref 0.1–0.9)
NEUT#: 6.2 10*3/uL (ref 1.5–6.5)
NEUT%: 67.7 % (ref 38.4–76.8)
Platelets: 183 10*3/uL (ref 145–400)
WBC: 9.2 10*3/uL (ref 3.9–10.3)
lymph#: 1.9 10*3/uL (ref 0.9–3.3)

## 2011-07-22 LAB — PROTIME-INR
INR: 2.9 (ref 2.00–3.50)
Protime: 34.8 Seconds — ABNORMAL HIGH (ref 10.6–13.4)

## 2011-07-29 ENCOUNTER — Other Ambulatory Visit: Payer: Self-pay | Admitting: Cardiology

## 2011-07-29 ENCOUNTER — Telehealth: Payer: Self-pay | Admitting: *Deleted

## 2011-07-29 NOTE — Telephone Encounter (Signed)
Per md review of recent INR for coumadin monitoring for antiphospholipid syndrome recommmeded no change for coumadin dose.  Per pt she is currently taking 5mg  alt 7.5mg . Next lab is scheduled for 11/27.

## 2011-08-18 ENCOUNTER — Telehealth: Payer: Self-pay | Admitting: *Deleted

## 2011-08-19 ENCOUNTER — Encounter: Payer: Self-pay | Admitting: Cardiology

## 2011-08-19 ENCOUNTER — Other Ambulatory Visit: Payer: PRIVATE HEALTH INSURANCE | Admitting: Lab

## 2011-08-19 ENCOUNTER — Ambulatory Visit (INDEPENDENT_AMBULATORY_CARE_PROVIDER_SITE_OTHER): Payer: PRIVATE HEALTH INSURANCE | Admitting: Cardiology

## 2011-08-19 DIAGNOSIS — R0600 Dyspnea, unspecified: Secondary | ICD-10-CM

## 2011-08-19 DIAGNOSIS — E785 Hyperlipidemia, unspecified: Secondary | ICD-10-CM

## 2011-08-19 DIAGNOSIS — R0989 Other specified symptoms and signs involving the circulatory and respiratory systems: Secondary | ICD-10-CM

## 2011-08-19 DIAGNOSIS — I10 Essential (primary) hypertension: Secondary | ICD-10-CM

## 2011-08-19 DIAGNOSIS — R0609 Other forms of dyspnea: Secondary | ICD-10-CM

## 2011-08-19 LAB — BASIC METABOLIC PANEL
BUN: 26 mg/dL — ABNORMAL HIGH (ref 6–23)
CO2: 30 mEq/L (ref 19–32)
Chloride: 102 mEq/L (ref 96–112)
Creatinine, Ser: 1.4 mg/dL — ABNORMAL HIGH (ref 0.4–1.2)
Potassium: 3.7 mEq/L (ref 3.5–5.1)

## 2011-08-19 LAB — BRAIN NATRIURETIC PEPTIDE: Pro B Natriuretic peptide (BNP): 164 pg/mL — ABNORMAL HIGH (ref 0.0–100.0)

## 2011-08-19 NOTE — Patient Instructions (Signed)
Your physician recommends that you have  lab work today--BMET/BNP 401.9  Your physician wants you to follow-up in: 1 year with Dr Shirlee Latch. (November 2013).You will receive a reminder letter in the mail two months in advance. If you don't receive a letter, please call our office to schedule the follow-up appointment.

## 2011-08-19 NOTE — Progress Notes (Signed)
PCP: Georgette Shell  65 yo with history of HTN, hyperlipidemia, COPD, and antiphospholipid antibody syndrome returns for cardiology followup.  Since I last saw her, she has been started on oxygen with exertion and at night for COPD.  Her breathing is better with oxygen.  She is not short of breath walking on flat ground though she does get short of breath climbing steps.  No chest pain.  She had significant ankle swelling with amlodipine which improved with lowering the dose to 5 mg daily.  BP is elevated today but has been < 140/90 at other doctor visits recently and when she checks at home.  She is planning on retiring at the end of December.   Labs (5/11): K 3.9, creatinine 1.2, HDL 68, LDL 79  Labs (9/11): K 3.1, creatinine 1.3  Labs (10/11): K 4.3, creatinine 1.2   ECG: NSR, RBBB   Allergies (verified):  1) ! Allopurinol  2) ! Macrobid  3) ! Keflex  4) ! Sulfa   Past Medical History:  1. Hyperlipidemia  2. HTN: renal artery ultrasound (10/11) with no renal artery stenosis. ACEI cough.  3. Hypothyroidism  4. History of UTIs  5. 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 Followed by Dr. Darnelle Catalan. On permanent warfarin.  6. Obesity  7. Gout: questionable. Uric acid normal when checked in 10/11.  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 Echo (8/11): moderate focal basal septal hypertrophy, EF 55-60%, no LV outflow tract gradient, grade I diastolic dysfunction, no regional wall motion abnormalities, no systolic anteiror motion of the mitral valve, PA systolic pressure 30 mmHg.  10. Low back pain  11. Rheumatoid arthritis on MTX  12. COPD on home oxygen  Family History:  Mother with "enlarged heart," probably died of MI at age 33. Father passed away young from cancer.   Social History:  Works in [employer], [employer]. Prior smoker, quit 2000. Separated from husband. Has children.   ROS: All systems reviewed and negative  except as per HPI.   Current Outpatient Prescriptions  Medication Sig Dispense Refill  . amLODipine (NORVASC) 5 MG tablet Take 5 mg by mouth daily.        Marland Kitchen aspirin 81 MG tablet Take 81 mg by mouth daily.        Marland Kitchen atorvastatin (LIPITOR) 40 MG tablet Take 20 mg by mouth daily.       . febuxostat (ULORIC) 40 MG tablet Take 40 mg by mouth daily.        . hydrochlorothiazide 50 MG tablet Take 50 mg by mouth daily.        Marland Kitchen leflunomide (ARAVA) 20 MG tablet Take 20 mg by mouth daily.        Marland Kitchen levothyroxine (SYNTHROID, LEVOTHROID) 75 MCG tablet Take 75 mcg by mouth daily.        . metoprolol (TOPROL-XL) 100 MG 24 hr tablet Take 1 tablet (100 mg total) by mouth 2 (two) times daily.  10 tablet  1  . Multiple Vitamin (MULTIVITAMIN) capsule Take 1 capsule by mouth daily.        . Omega-3 Fatty Acids (ULTRA OMEGA-3 FISH OIL) 1400 MG CAPS Take 1 tablet by mouth.        . potassium chloride SA (KLOR-CON M20) 20 MEQ tablet Take 1 tablet (20 mEq total) by mouth daily.  90 tablet  0  . predniSONE (DELTASONE) 5 MG tablet Take by mouth every other day. Alternating 5mg  and 10mg       .  SPIRIVA HANDIHALER 18 MCG inhalation capsule inhale contents of 1 capsule by mouth once daily  30 capsule  5  . telmisartan (MICARDIS) 80 MG tablet Take 80 mg by mouth daily.        Marland Kitchen warfarin (COUMADIN) 5 MG tablet As directed.        BP 152/92  Pulse 84  Ht 5\' 4"  (1.626 m)  Wt 92.987 kg (205 lb)  BMI 35.19 kg/m2 General: NAD, obese Neck: No JVD, no thyromegaly or thyroid nodule.  Lungs: Clear to auscultation bilaterally with normal respiratory effort. CV: Nondisplaced PMI.  Heart regular S1/S2, no S3/S4, 2/6 SEM RUSB.  1+ edema 1/2 up bilaterally.  No carotid bruit.  Normal pedal pulses.  Abdomen: Soft, nontender, no hepatosplenomegaly, no distention.  Neurologic: Alert and oriented x 3.  Psych: Normal affect. Extremities: No clubbing or cyanosis.

## 2011-08-20 DIAGNOSIS — R0600 Dyspnea, unspecified: Secondary | ICD-10-CM | POA: Insufficient documentation

## 2011-08-20 DIAGNOSIS — E785 Hyperlipidemia, unspecified: Secondary | ICD-10-CM | POA: Insufficient documentation

## 2011-08-20 NOTE — Assessment & Plan Note (Signed)
I will call her PCP for most recent lipids.

## 2011-08-20 NOTE — Assessment & Plan Note (Signed)
BP has been under good control in general.  I am not going to change her medications.  I asked her to have her sister listen to her while she sleeps to see if there is any loud snoring or gasping to suggest sleep apnea (which could perpetuate high blood pressure).  She denies significant daytime sleepiness.

## 2011-08-20 NOTE — Assessment & Plan Note (Signed)
I suspect that dyspnea on exertion is primarily due to COPD.  She does not appear volume overloaded on exam but I will check a BNP today.  She needs to get more exercise to improve her stamina; I suggested working up to 30 minutes of exercise 5 times a week.

## 2011-08-21 ENCOUNTER — Other Ambulatory Visit (HOSPITAL_BASED_OUTPATIENT_CLINIC_OR_DEPARTMENT_OTHER): Payer: PRIVATE HEALTH INSURANCE | Admitting: Lab

## 2011-08-21 ENCOUNTER — Other Ambulatory Visit: Payer: Self-pay | Admitting: Oncology

## 2011-08-21 DIAGNOSIS — D689 Coagulation defect, unspecified: Secondary | ICD-10-CM

## 2011-08-21 DIAGNOSIS — Z7901 Long term (current) use of anticoagulants: Secondary | ICD-10-CM

## 2011-08-21 DIAGNOSIS — Z7982 Long term (current) use of aspirin: Secondary | ICD-10-CM

## 2011-08-21 DIAGNOSIS — D6859 Other primary thrombophilia: Secondary | ICD-10-CM

## 2011-08-21 LAB — CBC WITH DIFFERENTIAL/PLATELET
BASO%: 0.4 % (ref 0.0–2.0)
Basophils Absolute: 0 10*3/uL (ref 0.0–0.1)
HCT: 34.6 % — ABNORMAL LOW (ref 34.8–46.6)
HGB: 11 g/dL — ABNORMAL LOW (ref 11.6–15.9)
LYMPH%: 10.5 % — ABNORMAL LOW (ref 14.0–49.7)
MCH: 26.3 pg (ref 25.1–34.0)
MCHC: 31.8 g/dL (ref 31.5–36.0)
MONO#: 0.5 10*3/uL (ref 0.1–0.9)
NEUT%: 82.7 % — ABNORMAL HIGH (ref 38.4–76.8)
Platelets: 166 10*3/uL (ref 145–400)
WBC: 9.9 10*3/uL (ref 3.9–10.3)
lymph#: 1 10*3/uL (ref 0.9–3.3)

## 2011-08-21 LAB — PROTIME-INR: INR: 2.5 (ref 2.00–3.50)

## 2011-08-22 ENCOUNTER — Telehealth: Payer: Self-pay

## 2011-08-22 NOTE — Telephone Encounter (Signed)
Called pt to inform her no change in coumadin (7.5 mg daily) and keep next lab appointment per Zollie Scale, PA.  Left message with these instructions and to call office back to confirm she has received this.

## 2011-08-25 ENCOUNTER — Other Ambulatory Visit: Payer: Self-pay | Admitting: Cardiology

## 2011-09-12 ENCOUNTER — Other Ambulatory Visit: Payer: Self-pay | Admitting: Oncology

## 2011-09-12 ENCOUNTER — Other Ambulatory Visit (HOSPITAL_BASED_OUTPATIENT_CLINIC_OR_DEPARTMENT_OTHER): Payer: PRIVATE HEALTH INSURANCE

## 2011-09-12 DIAGNOSIS — D6859 Other primary thrombophilia: Secondary | ICD-10-CM

## 2011-09-12 DIAGNOSIS — D689 Coagulation defect, unspecified: Secondary | ICD-10-CM

## 2011-09-12 DIAGNOSIS — Z7982 Long term (current) use of aspirin: Secondary | ICD-10-CM

## 2011-09-12 DIAGNOSIS — Z7901 Long term (current) use of anticoagulants: Secondary | ICD-10-CM

## 2011-09-12 LAB — CBC WITH DIFFERENTIAL/PLATELET
BASO%: 0.4 % (ref 0.0–2.0)
Basophils Absolute: 0 10*3/uL (ref 0.0–0.1)
EOS%: 0.6 % (ref 0.0–7.0)
HGB: 11.6 g/dL (ref 11.6–15.9)
MCH: 25.7 pg (ref 25.1–34.0)
MCHC: 31.3 g/dL — ABNORMAL LOW (ref 31.5–36.0)
MCV: 82.1 fL (ref 79.5–101.0)
MONO%: 3.6 % (ref 0.0–14.0)
RBC: 4.52 10*6/uL (ref 3.70–5.45)
RDW: 15.4 % — ABNORMAL HIGH (ref 11.2–14.5)
lymph#: 1 10*3/uL (ref 0.9–3.3)
nRBC: 0 % (ref 0–0)

## 2011-09-12 LAB — PROTIME-INR
INR: 2.6 (ref 2.00–3.50)
Protime: 31.2 Seconds — ABNORMAL HIGH (ref 10.6–13.4)

## 2011-10-14 ENCOUNTER — Other Ambulatory Visit: Payer: Medicare Other

## 2011-10-14 ENCOUNTER — Encounter: Payer: Self-pay | Admitting: *Deleted

## 2011-10-14 LAB — CBC WITH DIFFERENTIAL/PLATELET
EOS%: 2.6 % (ref 0.0–7.0)
Eosinophils Absolute: 0.3 10*3/uL (ref 0.0–0.5)
LYMPH%: 9.6 % — ABNORMAL LOW (ref 14.0–49.7)
MCH: 25.6 pg (ref 25.1–34.0)
MCHC: 31.6 g/dL (ref 31.5–36.0)
MCV: 80.8 fL (ref 79.5–101.0)
MONO%: 4 % (ref 0.0–14.0)
NEUT#: 8.2 10*3/uL — ABNORMAL HIGH (ref 1.5–6.5)
Platelets: 173 10*3/uL (ref 145–400)
RBC: 4.54 10*6/uL (ref 3.70–5.45)
RDW: 15.8 % — ABNORMAL HIGH (ref 11.2–14.5)

## 2011-10-14 LAB — PROTIME-INR
INR: 3.7 — ABNORMAL HIGH (ref 2.00–3.50)
Protime: 44.4 Seconds — ABNORMAL HIGH (ref 10.6–13.4)

## 2011-10-14 NOTE — Progress Notes (Unsigned)
Per pt current dose of coumadin is 7.5mg  alt 5mg .

## 2011-10-15 ENCOUNTER — Encounter: Payer: Self-pay | Admitting: *Deleted

## 2011-10-15 NOTE — Progress Notes (Unsigned)
Per MD, notified pt to take coumadin 5mg , 5mg , then 7.5. Will repeat labs 11/11/11

## 2011-10-25 ENCOUNTER — Telehealth: Payer: Self-pay | Admitting: Oncology

## 2011-10-25 NOTE — Telephone Encounter (Signed)
called pt lmovm for appts in may2013. asked pt to rtn call to confirm appts °

## 2011-10-27 ENCOUNTER — Telehealth: Payer: Self-pay | Admitting: Oncology

## 2011-10-27 NOTE — Telephone Encounter (Signed)
pt rtn call and confirmed appts for [id number]

## 2011-11-03 ENCOUNTER — Other Ambulatory Visit: Payer: Self-pay | Admitting: Cardiology

## 2011-11-11 ENCOUNTER — Telehealth: Payer: Self-pay

## 2011-11-11 ENCOUNTER — Other Ambulatory Visit (HOSPITAL_BASED_OUTPATIENT_CLINIC_OR_DEPARTMENT_OTHER): Payer: Medicare Other | Admitting: Lab

## 2011-11-11 DIAGNOSIS — Z7982 Long term (current) use of aspirin: Secondary | ICD-10-CM

## 2011-11-11 DIAGNOSIS — D6859 Other primary thrombophilia: Secondary | ICD-10-CM

## 2011-11-11 DIAGNOSIS — Z7901 Long term (current) use of anticoagulants: Secondary | ICD-10-CM

## 2011-11-11 DIAGNOSIS — D689 Coagulation defect, unspecified: Secondary | ICD-10-CM

## 2011-11-11 LAB — PROTIME-INR
INR: 3.1 (ref 2.00–3.50)
Protime: 37.2 Seconds — ABNORMAL HIGH (ref 10.6–13.4)

## 2011-11-11 LAB — CBC WITH DIFFERENTIAL/PLATELET
Basophils Absolute: 0.1 10*3/uL (ref 0.0–0.1)
EOS%: 4.9 % (ref 0.0–7.0)
Eosinophils Absolute: 0.5 10*3/uL (ref 0.0–0.5)
LYMPH%: 19 % (ref 14.0–49.7)
MCH: 25.3 pg (ref 25.1–34.0)
MCV: 79.9 fL (ref 79.5–101.0)
MONO%: 6 % (ref 0.0–14.0)
NEUT#: 6.4 10*3/uL (ref 1.5–6.5)
Platelets: 158 10*3/uL (ref 145–400)
RBC: 4.67 10*6/uL (ref 3.70–5.45)
RDW: 16.4 % — ABNORMAL HIGH (ref 11.2–14.5)
nRBC: 0 % (ref 0–0)

## 2011-11-11 NOTE — Telephone Encounter (Signed)
Called pt and left message for her to call office back.  Need to confirm how much coumadin she is taking.

## 2011-11-17 ENCOUNTER — Other Ambulatory Visit: Payer: Self-pay | Admitting: Cardiology

## 2011-11-17 ENCOUNTER — Other Ambulatory Visit: Payer: Self-pay | Admitting: Internal Medicine

## 2011-11-17 MED ORDER — TELMISARTAN 80 MG PO TABS: 80.0000 mg | ORAL_TABLET | Freq: Every day | ORAL | Status: DC

## 2011-11-17 MED ORDER — METOPROLOL SUCCINATE ER 100 MG PO TB24: 100.0000 mg | ORAL_TABLET | Freq: Two times a day (BID) | ORAL | Status: DC

## 2011-11-17 MED ORDER — HYDROCHLOROTHIAZIDE 50 MG PO TABS: 50.0000 mg | ORAL_TABLET | Freq: Every day | ORAL | Status: DC

## 2011-11-17 NOTE — Telephone Encounter (Signed)
New Msg: Pt needs refills called in ASAP.

## 2011-12-09 ENCOUNTER — Other Ambulatory Visit: Payer: Self-pay | Admitting: Cardiology

## 2011-12-09 ENCOUNTER — Other Ambulatory Visit: Payer: PRIVATE HEALTH INSURANCE | Admitting: Lab

## 2011-12-09 NOTE — Telephone Encounter (Signed)
RX Refill-Metoprolol succinate (TOPROL-XL) 100 MG 24 hr tablet    Patient out of town taking care of sister need med refill, please send to RX to:   [facility]  [address] [place], [place] [address] Phone [phone]   Patient can be reached at sisters house, should you need any additional information [phone]

## 2011-12-10 ENCOUNTER — Other Ambulatory Visit: Payer: Self-pay | Admitting: *Deleted

## 2011-12-10 MED ORDER — METOPROLOL SUCCINATE ER 100 MG PO TB24: 100.0000 mg | ORAL_TABLET | Freq: Two times a day (BID) | ORAL | Status: DC

## 2011-12-10 NOTE — Telephone Encounter (Signed)
I called the [facility] in [place], had her rx transferred, notified the pt of this transfer. She was very pleased and was told when able to return to [place] go to her local [facility] and she will be able to transfer her rx back to [place]. Pt verbally agreed of this change and was happy for the convenience of rx transferring between [facility].

## 2012-01-06 ENCOUNTER — Other Ambulatory Visit (HOSPITAL_BASED_OUTPATIENT_CLINIC_OR_DEPARTMENT_OTHER): Payer: Medicare Other | Admitting: Lab

## 2012-01-06 DIAGNOSIS — D6859 Other primary thrombophilia: Secondary | ICD-10-CM

## 2012-01-06 DIAGNOSIS — Z7901 Long term (current) use of anticoagulants: Secondary | ICD-10-CM

## 2012-01-06 DIAGNOSIS — D689 Coagulation defect, unspecified: Secondary | ICD-10-CM

## 2012-01-06 DIAGNOSIS — Z7982 Long term (current) use of aspirin: Secondary | ICD-10-CM

## 2012-01-06 LAB — CBC WITH DIFFERENTIAL/PLATELET
EOS%: 1.9 % (ref 0.0–7.0)
MCH: 26.3 pg (ref 25.1–34.0)
MCHC: 32.2 g/dL (ref 31.5–36.0)
MCV: 81.9 fL (ref 79.5–101.0)
MONO%: 7 % (ref 0.0–14.0)
RBC: 4.48 10*6/uL (ref 3.70–5.45)
RDW: 16.8 % — ABNORMAL HIGH (ref 11.2–14.5)

## 2012-01-06 LAB — PROTIME-INR
INR: 3 (ref 2.00–3.50)
Protime: 36 Seconds — ABNORMAL HIGH (ref 10.6–13.4)

## 2012-02-03 ENCOUNTER — Other Ambulatory Visit (HOSPITAL_BASED_OUTPATIENT_CLINIC_OR_DEPARTMENT_OTHER): Payer: Medicare Other | Admitting: Lab

## 2012-02-03 ENCOUNTER — Ambulatory Visit (HOSPITAL_BASED_OUTPATIENT_CLINIC_OR_DEPARTMENT_OTHER): Payer: Medicare Other | Admitting: Oncology

## 2012-02-03 VITALS — BP 176/93 | HR 89 | Temp 98.0°F | Ht 64.0 in | Wt 193.1 lb

## 2012-02-03 DIAGNOSIS — Z7901 Long term (current) use of anticoagulants: Secondary | ICD-10-CM

## 2012-02-03 DIAGNOSIS — D6861 Antiphospholipid syndrome: Secondary | ICD-10-CM

## 2012-02-03 DIAGNOSIS — Z7982 Long term (current) use of aspirin: Secondary | ICD-10-CM

## 2012-02-03 DIAGNOSIS — D6859 Other primary thrombophilia: Secondary | ICD-10-CM

## 2012-02-03 DIAGNOSIS — R894 Abnormal immunological findings in specimens from other organs, systems and tissues: Secondary | ICD-10-CM

## 2012-02-03 DIAGNOSIS — D689 Coagulation defect, unspecified: Secondary | ICD-10-CM

## 2012-02-03 HISTORY — DX: Antiphospholipid syndrome: D68.61

## 2012-02-03 LAB — CBC WITH DIFFERENTIAL/PLATELET
Basophils Absolute: 0 10*3/uL (ref 0.0–0.1)
EOS%: 2.2 % (ref 0.0–7.0)
HGB: 11.4 g/dL — ABNORMAL LOW (ref 11.6–15.9)
MCH: 26.4 pg (ref 25.1–34.0)
MCV: 82.6 fL (ref 79.5–101.0)
MONO%: 4.1 % (ref 0.0–14.0)
RBC: 4.32 10*6/uL (ref 3.70–5.45)
RDW: 16.2 % — ABNORMAL HIGH (ref 11.2–14.5)

## 2012-02-03 LAB — PROTIME-INR
INR: 3.1 (ref 2.00–3.50)
Protime: 37.2 Seconds — ABNORMAL HIGH (ref 10.6–13.4)

## 2012-02-03 NOTE — Progress Notes (Signed)
ID: Karen Chambers   DOB: 1946/05/06  MR#: [id number]  CSN#:[id number]  INTERVAL HISTORY: Karen Chambers returns today for followup of her coagulopathy. Since her last visit here, her sister in [place] was diagnosed with a stage IV brain cancer and died 3 weeks after the diagnosis. Karen Chambers drove up to [place] several times and has been very involved. The rest of the family is doing well. She herself is now retired.  REVIEW OF SYSTEMS: She has had no bleeding and specifically no problems with epistaxis, bright red blood per, melena, hematuria, or unusual bruising. Her gout is under control and her COPD is "about as good as it gets". She continues to have mild arthritis symptoms, perfectly some chronic low back pain, but overall a detailed review of systems today was stable  PAST MEDICAL HISTORY: Past Medical History  Diagnosis Date  . Other and unspecified hyperlipidemia 401.9  . Hypothyroidism   . Obesity, unspecified   . Gout   . Low back pain   . Rheumatoid arthritis   . Cough 11/12/2010    PAST SURGICAL HISTORY: Past Surgical History  Procedure Date  . Cholecystectomy   . Total abdominal hysterectomy     FAMILY HISTORY Family History  Problem Relation Age of Onset  . Cancer Father   . Heart disease Mother     HEALTH MAINTENANCE: History  Substance Use Topics  . Smoking status: Former Smoker -- 1.5 packs/day for 36 years    Types: Cigarettes    Quit date: 07/05/1999  . Smokeless tobacco: Never Used   Comment: 1ppd x 36 years  . Alcohol Use: No     Colonoscopy: due 2014  PAP: n/a  Mammogram: UTD  Lipid panel:  Allergies  Allergen Reactions  . Ace Inhibitors     cough  . Allopurinol   . Cephalexin   . Nitrofurantoin   . Sulfonamide Derivatives     Current Outpatient Prescriptions  Medication Sig Dispense Refill  . amLODipine (NORVASC) 5 MG tablet Take 5 mg by mouth daily.        Marland Kitchen aspirin 81 MG tablet Take 81 mg by mouth daily.        Marland Kitchen atorvastatin  (LIPITOR) 40 MG tablet Take 20 mg by mouth daily.       . febuxostat (ULORIC) 40 MG tablet Take 40 mg by mouth daily.        . hydrochlorothiazide (HYDRODIURIL) 50 MG tablet Take 1 tablet (50 mg total) by mouth daily.  90 tablet  11  . KLOR-CON M20 20 MEQ tablet TAKE ONE TABLET BY MOUTH EVERY DAY  30 each  11  . leflunomide (ARAVA) 20 MG tablet Take 20 mg by mouth daily.        Marland Kitchen levothyroxine (SYNTHROID, LEVOTHROID) 75 MCG tablet Take 75 mcg by mouth daily.        . metoprolol succinate (TOPROL-XL) 100 MG 24 hr tablet Take 1 tablet (100 mg total) by mouth 2 (two) times daily.  180 tablet  0  . Multiple Vitamin (MULTIVITAMIN) capsule Take 1 capsule by mouth daily.        . Omega-3 Fatty Acids (ULTRA OMEGA-3 FISH OIL) 1400 MG CAPS Take 1 tablet by mouth.        . predniSONE (DELTASONE) 5 MG tablet Take by mouth every other day. Alternating 5mg  and 10mg       . SPIRIVA HANDIHALER 18 MCG inhalation capsule INHALE ONE DOSE BY MOUTH EVERY DAY  30 each  4  .  telmisartan (MICARDIS) 80 MG tablet Take 1 tablet (80 mg total) by mouth daily.  90 tablet  11  . warfarin (COUMADIN) 5 MG tablet As directed.        OBJECTIVE: Middle-aged white woman in no acute distress  Filed Vitals:   02/03/12 1314  BP: 176/93  Pulse: 89  Temp: 98 F (36.7 C)     Body mass index is 33.15 kg/(m^2).    ECOG FS: 1 Sclerae unicteric Oropharynx clear No peripheral adenopathy Lungs no rales or rhonchi Heart regular rate and rhythm Abd benign MSK no focal spinal tenderness Neuro: nonfocal   LAB RESULTS: Lab Results  Component Value Date   WBC 10.3 02/03/2012   NEUTROABS 8.5* 02/03/2012   HGB 11.4* 02/03/2012   HCT 35.7 02/03/2012   MCV 82.6 02/03/2012   PLT 186 02/03/2012      Chemistry      Component Value Date/Time   NA 143 08/19/2011 1127   K 3.7 08/19/2011 1127   CL 102 08/19/2011 1127   CO2 30 08/19/2011 1127   BUN 26* 08/19/2011 1127   CREATININE 1.4* 08/19/2011 1127      Component Value Date/Time    CALCIUM 8.8 08/19/2011 1127       No results found for this basename: LABCA2    No components found with this basename: [id number]     Lab 02/03/12 1302  INR 3.10    Urinalysis No results found for this basename: colorurine, appearanceur, labspec, phurine, glucoseu, hgbur, bilirubinur, ketonesur, proteinur, urobilinogen, nitrite, leukocytesur    STUDIES: No new results found.  ASSESSMENT: 66 year old [place] woman with a history of antiphospholipid syndrome with a weakly positive lupus anticoagulant, a persistent elevation of the PTT and elevated anticardiolipin antibodies with a history of thalamic infarct October 2007 on lifelong Coumadin; other problems including rheumatoid arthritis, gout, hypertension, obesity, and COPD.     PLAN: She is doing very well as far as her coagulopathy is concerned, and we will continue to try to keep her PT/INR between 2.5 and 3.5. Currently she takes 7-1/2 mg of Coumadin day 1 and then 5 mg day 2 and day 3, and then repeats. We will follow her labwork every 4 weeks and she will see me again in one year. She knows to call for any other problems that may develop before the next visit  Karen Chambers C    02/03/2012

## 2012-02-10 ENCOUNTER — Ambulatory Visit (INDEPENDENT_AMBULATORY_CARE_PROVIDER_SITE_OTHER): Payer: Medicare Other | Admitting: Internal Medicine

## 2012-02-10 ENCOUNTER — Encounter: Payer: Self-pay | Admitting: Internal Medicine

## 2012-02-10 VITALS — BP 148/90 | HR 94 | Temp 98.0°F | Ht 63.0 in | Wt 192.8 lb

## 2012-02-10 DIAGNOSIS — J4489 Other specified chronic obstructive pulmonary disease: Secondary | ICD-10-CM

## 2012-02-10 DIAGNOSIS — J449 Chronic obstructive pulmonary disease, unspecified: Secondary | ICD-10-CM

## 2012-02-10 DIAGNOSIS — Z23 Encounter for immunization: Secondary | ICD-10-CM

## 2012-02-10 NOTE — Progress Notes (Signed)
Subjective:    Patient ID: Karen Chambers, female    DOB: 09/13/1946, 66 y.o.   MRN: [id number]  HPI 66 year old  #obese female,  #ex-25 pack smoker,  #hx of macrodantin HP v COP NOS,  # VW disease, APLA syndrome - thalamic infarct Oct 2010 on chronic coumadin (dr Darnelle Catalan),   #RA (chronic prednisone since summer 2011, intolerant to few weeks of mtx summer 2011), gout, # ACE inhibitor cough - resolved Feb 2012.  #Gold Stage 2 copd (followed by Dr. Maple Hudson 2005-2006 and Dr. Marchelle Gearing Jan 2012 - date). - MM genotype  - Baseline PFt Aug 2005 - fev1 1.4L/61%, DLCO 74%.   - Class 2 dyspnea with baseline desaturation to 86% walking 185 feet in feb 2012; uses innogen system  - PFTS today 03/03/2011 - FEv1 1.2L/57%, no bd respoinse. dLCO 14.5/60%. This is a 200cc drop in 7 yeears . Rx with spiriva (changed from symbicort)    OV 02/10/2012 68 month followup for  Gold stage 2 copd   - Now using inongen system. Class 2 exertional dyspnea relieved by rest is unchanged since July 2012. Associated cough is only mild and is due to spring allergies. Overall well. No associaed chest pain, hemoptysis. COPD symptom CAT score is 15 and reflects mild-mod disease symptom burden. Notes, she. Wants pneumovax   CAT COPD Symptom and Quality of Life Score ([employer])  0 (no burden) to 5 (highest burden)  Never Cough -> Cough all the time 2  No phlegm in chest -> Chest is full of phlegm 2  No chest tightness -> Chest feels very tight 0  No dyspnea for 1 flight stairs/hill -> Very dyspneic for 1 flight of stairs 4  No limitations for ADL at home -> Very limited with ADL at home 3  Confident leaving home -> Not at all confident leaving home 0  Sleep soundly -> Do not sleep soundly because of lung condition 2  Lots of Energy -> No energy at all 2  TOTAL Score (max 40)  15      Past, Family, Social reviewed: sister died of astrocytoma 2011/12/26. She is sad but has purpose. Has great grand  child.    Current outpatient prescriptions:amLODipine (NORVASC) 5 MG tablet, Take 5 mg by mouth daily.  , Disp: , Rfl: ;  aspirin 81 MG tablet, Take 81 mg by mouth daily.  , Disp: , Rfl: ;  atorvastatin (LIPITOR) 40 MG tablet, Take 20 mg by mouth daily. , Disp: , Rfl: ;  febuxostat (ULORIC) 40 MG tablet, Take 40 mg by mouth daily.  , Disp: , Rfl:  hydrochlorothiazide (HYDRODIURIL) 50 MG tablet, Take 1 tablet (50 mg total) by mouth daily., Disp: 90 tablet, Rfl: 11;  KLOR-CON M20 20 MEQ tablet, TAKE ONE TABLET BY MOUTH EVERY DAY, Disp: 30 each, Rfl: 11;  leflunomide (ARAVA) 20 MG tablet, Take 20 mg by mouth daily.  , Disp: , Rfl: ;  levothyroxine (SYNTHROID, LEVOTHROID) 100 MCG tablet, Take 1 tablet by mouth Daily., Disp: , Rfl:  metoprolol succinate (TOPROL-XL) 100 MG 24 hr tablet, Take 1 tablet (100 mg total) by mouth 2 (two) times daily., Disp: 180 tablet, Rfl: 0;  Multiple Vitamin (MULTIVITAMIN) capsule, Take 1 capsule by mouth daily.  , Disp: , Rfl: ;  Omega-3 Fatty Acids (ULTRA OMEGA-3 FISH OIL) 1400 MG CAPS, Take 1 tablet by mouth.  , Disp: , Rfl: ;  predniSONE (DELTASONE) 5 MG tablet, Take 5 mg by  mouth every other day., Disp: , Rfl:  SPIRIVA HANDIHALER 18 MCG inhalation capsule, INHALE ONE DOSE BY MOUTH EVERY DAY, Disp: 30 each, Rfl: 4;  telmisartan (MICARDIS) 80 MG tablet, Take 1 tablet (80 mg total) by mouth daily., Disp: 90 tablet, Rfl: 11;  traMADol (ULTRAM) 50 MG tablet, Take 50 mg by mouth every 6 (six) hours as needed., Disp: , Rfl: ;  warfarin (COUMADIN) 5 MG tablet, As directed., Disp: , Rfl:    Review of Systems  Constitutional: Negative for fever and unexpected weight change.  HENT: Negative for ear pain, nosebleeds, congestion, sore throat, rhinorrhea, sneezing, trouble swallowing, dental problem, postnasal drip and sinus pressure.   Eyes: Negative for redness and itching.  Respiratory: Positive for shortness of breath. Negative for cough, chest tightness and wheezing.     Cardiovascular: Negative for palpitations and leg swelling.  Gastrointestinal: Negative for nausea and vomiting.  Genitourinary: Negative for dysuria.  Musculoskeletal: Negative for joint swelling.  Skin: Negative for rash.  Neurological: Negative for headaches.  Hematological: Does not bruise/bleed easily.  Psychiatric/Behavioral: Negative for dysphoric mood. The patient is not nervous/anxious.        Objective:   Physical Exam Vitals reviewed. Constitutional: She is oriented to person, place, and time. She appears well-developed and well-nourished. No distress.       Obese cushingoid  HENT:  Head: Normocephalic and atraumatic.  Right Ear: External ear normal.  Left Ear: External ear normal.  Mouth/Throat: Oropharynx is clear and moist. Mild post nasal drip + Eyes: Conjunctivae and EOM are normal. Pupils are equal, round, and reactive to light. Right eye exhibits no discharge. Left eye exhibits no discharge. No scleral icterus.  Neck: Normal range of motion. Neck supple. No JVD present. No tracheal deviation present. No thyromegaly present.  Cardiovascular: Normal rate, regular rhythm and intact distal pulses.  Exam reveals no gallop and no friction rub.   Murmur heard.      Baseline murmur systolic soft  Pulmonary/Chest: Effort normal and breath sounds normal. No respiratory distress. She has no wheezes. She has no rales. She exhibits no tenderness.  Abdominal: Soft. Bowel sounds are normal. She exhibits no distension and no mass. There is no tenderness. There is no rebound and no guarding.  Musculoskeletal: Normal range of motion. She exhibits no edema and no tenderness.  Lymphadenopathy:    She has no cervical adenopathy.  Neurological: She is alert and oriented to person, place, and time. She has normal reflexes. No cranial nerve deficit. She exhibits normal muscle tone. Coordination normal.  Skin: Skin is warm and dry. No rash noted. She is not diaphoretic. No erythema. No  pallor.  Psychiatric: She has a normal mood and affect. Her behavior is normal. Judgment and thought content normal.           Assessment & Plan:

## 2012-02-10 NOTE — Assessment & Plan Note (Signed)
#  spring allergies  - take some samples of nasal steroid like flonase and try it 2 squirts each nostril daily  #COPD   - continue spiriva 1 puff daily  - use oxygen as needed  - have pneumovax vaccine today  #Followup 9 months or sooner if needed 

## 2012-02-10 NOTE — Patient Instructions (Signed)
#  spring allergies  - take some samples of nasal steroid like flonase and try it 2 squirts each nostril daily  #COPD   - continue spiriva 1 puff daily  - use oxygen as needed  - have pneumovax vaccine today  #Followup 9 months or sooner if needed

## 2012-02-24 ENCOUNTER — Other Ambulatory Visit: Payer: Medicare Other | Admitting: Lab

## 2012-03-02 ENCOUNTER — Other Ambulatory Visit: Payer: Self-pay | Admitting: *Deleted

## 2012-03-02 ENCOUNTER — Other Ambulatory Visit (HOSPITAL_BASED_OUTPATIENT_CLINIC_OR_DEPARTMENT_OTHER): Payer: Medicare Other | Admitting: Lab

## 2012-03-02 ENCOUNTER — Other Ambulatory Visit: Payer: PRIVATE HEALTH INSURANCE | Admitting: Lab

## 2012-03-02 ENCOUNTER — Telehealth: Payer: Self-pay | Admitting: Oncology

## 2012-03-02 DIAGNOSIS — D6859 Other primary thrombophilia: Secondary | ICD-10-CM

## 2012-03-02 DIAGNOSIS — D6861 Antiphospholipid syndrome: Secondary | ICD-10-CM

## 2012-03-02 DIAGNOSIS — D689 Coagulation defect, unspecified: Secondary | ICD-10-CM

## 2012-03-02 LAB — CBC WITH DIFFERENTIAL/PLATELET
BASO%: 0.3 % (ref 0.0–2.0)
Basophils Absolute: 0 10*3/uL (ref 0.0–0.1)
Eosinophils Absolute: 0.4 10*3/uL (ref 0.0–0.5)
HCT: 35.7 % (ref 34.8–46.6)
HGB: 11.4 g/dL — ABNORMAL LOW (ref 11.6–15.9)
MONO#: 0.7 10*3/uL (ref 0.1–0.9)
NEUT#: 6.6 10*3/uL — ABNORMAL HIGH (ref 1.5–6.5)
NEUT%: 68.9 % (ref 38.4–76.8)
WBC: 9.6 10*3/uL (ref 3.9–10.3)
lymph#: 1.9 10*3/uL (ref 0.9–3.3)

## 2012-03-02 LAB — PROTIME-INR: INR: 2.8 (ref 2.00–3.50)

## 2012-03-02 MED ORDER — WARFARIN SODIUM 5 MG PO TABS: 5.0000 mg | ORAL_TABLET | Freq: Every day | ORAL | Status: DC

## 2012-03-02 NOTE — Telephone Encounter (Signed)
No change in current coumadin dose of 5mg  daily. Called and discussed with pt.

## 2012-03-02 NOTE — Telephone Encounter (Signed)
gve the pt her July-June 2014 appt calendar

## 2012-03-30 ENCOUNTER — Other Ambulatory Visit (HOSPITAL_BASED_OUTPATIENT_CLINIC_OR_DEPARTMENT_OTHER): Payer: Medicare Other | Admitting: Lab

## 2012-03-30 DIAGNOSIS — D6861 Antiphospholipid syndrome: Secondary | ICD-10-CM

## 2012-03-30 DIAGNOSIS — D6859 Other primary thrombophilia: Secondary | ICD-10-CM

## 2012-03-30 LAB — CBC WITH DIFFERENTIAL/PLATELET
Basophils Absolute: 0 10*3/uL (ref 0.0–0.1)
EOS%: 4.7 % (ref 0.0–7.0)
HGB: 11 g/dL — ABNORMAL LOW (ref 11.6–15.9)
MCH: 26.4 pg (ref 25.1–34.0)
MONO#: 0.7 10*3/uL (ref 0.1–0.9)
NEUT#: 4.8 10*3/uL (ref 1.5–6.5)
RDW: 15.4 % — ABNORMAL HIGH (ref 11.2–14.5)
WBC: 7.8 10*3/uL (ref 3.9–10.3)
lymph#: 1.9 10*3/uL (ref 0.9–3.3)

## 2012-03-30 LAB — PROTIME-INR
INR: 3.5 (ref 2.00–3.50)
Protime: 42 Seconds — ABNORMAL HIGH (ref 10.6–13.4)

## 2012-04-01 ENCOUNTER — Telehealth: Payer: Self-pay | Admitting: *Deleted

## 2012-04-01 NOTE — Telephone Encounter (Signed)
WRITTEN ORDERS FROM DR.MAGRINAT- COUMADIN 7.5MG  DAY 1, 5MG  DAY 2 & 3 REPEAT. NEXT LAB 04/27/12. NOTIFIED PT. OF DR.MAGRINAT'S INSTRUCTIONS. SHE VOICES UNDERSTANDING.

## 2012-04-27 ENCOUNTER — Other Ambulatory Visit (HOSPITAL_BASED_OUTPATIENT_CLINIC_OR_DEPARTMENT_OTHER): Payer: Medicare Other

## 2012-04-27 ENCOUNTER — Telehealth: Payer: Self-pay | Admitting: Cardiology

## 2012-04-27 DIAGNOSIS — D6861 Antiphospholipid syndrome: Secondary | ICD-10-CM

## 2012-04-27 DIAGNOSIS — Z7901 Long term (current) use of anticoagulants: Secondary | ICD-10-CM

## 2012-04-27 LAB — CBC WITH DIFFERENTIAL/PLATELET
EOS%: 4.4 % (ref 0.0–7.0)
Eosinophils Absolute: 0.4 10*3/uL (ref 0.0–0.5)
MCH: 26.1 pg (ref 25.1–34.0)
MCV: 82.1 fL (ref 79.5–101.0)
MONO%: 8.3 % (ref 0.0–14.0)
NEUT#: 5.8 10*3/uL (ref 1.5–6.5)
RBC: 4.14 10*6/uL (ref 3.70–5.45)
RDW: 15.1 % — ABNORMAL HIGH (ref 11.2–14.5)
nRBC: 0 % (ref 0–0)

## 2012-04-27 LAB — PROTIME-INR: Protime: 34.8 Seconds — ABNORMAL HIGH (ref 10.6–13.4)

## 2012-04-27 NOTE — Telephone Encounter (Signed)
Pt needs refill @ [facility] amlodipine 5mg  if available

## 2012-04-28 ENCOUNTER — Other Ambulatory Visit: Payer: Self-pay | Admitting: *Deleted

## 2012-04-28 MED ORDER — AMLODIPINE BESYLATE 5 MG PO TABS: 5.0000 mg | ORAL_TABLET | Freq: Every day | ORAL | Status: DC

## 2012-05-04 ENCOUNTER — Other Ambulatory Visit: Payer: Medicare Other | Admitting: Lab

## 2012-05-13 ENCOUNTER — Other Ambulatory Visit: Payer: Self-pay | Admitting: Cardiology

## 2012-05-25 ENCOUNTER — Other Ambulatory Visit (HOSPITAL_BASED_OUTPATIENT_CLINIC_OR_DEPARTMENT_OTHER): Payer: Medicare Other | Admitting: Lab

## 2012-05-25 DIAGNOSIS — D6859 Other primary thrombophilia: Secondary | ICD-10-CM

## 2012-05-25 DIAGNOSIS — D6861 Antiphospholipid syndrome: Secondary | ICD-10-CM

## 2012-05-25 LAB — CBC WITH DIFFERENTIAL/PLATELET
BASO%: 0.5 % (ref 0.0–2.0)
LYMPH%: 22.1 % (ref 14.0–49.7)
MCHC: 32.1 g/dL (ref 31.5–36.0)
MCV: 81.6 fL (ref 79.5–101.0)
MONO%: 7.4 % (ref 0.0–14.0)
Platelets: 184 10*3/uL (ref 145–400)
RBC: 4.24 10*6/uL (ref 3.70–5.45)

## 2012-05-25 LAB — PROTIME-INR: INR: 2.5 (ref 2.00–3.50)

## 2012-05-28 ENCOUNTER — Ambulatory Visit (INDEPENDENT_AMBULATORY_CARE_PROVIDER_SITE_OTHER): Payer: Medicare Other | Admitting: Family Medicine

## 2012-05-28 VITALS — BP 150/90 | HR 93 | Temp 98.0°F | Resp 18 | Ht 63.0 in | Wt 188.0 lb

## 2012-05-28 DIAGNOSIS — Z23 Encounter for immunization: Secondary | ICD-10-CM

## 2012-05-28 DIAGNOSIS — M549 Dorsalgia, unspecified: Secondary | ICD-10-CM

## 2012-05-28 MED ORDER — TRAMADOL HCL 50 MG PO TABS: 50.0000 mg | ORAL_TABLET | Freq: Four times a day (QID) | ORAL | Status: DC | PRN

## 2012-05-28 NOTE — Progress Notes (Signed)
[facility]:  Office Visit  Chief Complaint:  Chief Complaint  Patient presents with  . Medication Refill    Tramadol 50mg  BID-TID prn (saw Dr. Kellie Simmering yesterday and he forgot to refill this med)  . Immunizations    flu shot    HPI: Karen Chambers is a 66 y.o. female who complains of the following: 1. Here for pain meds for back pain, arthritis and RA. Sees Dr. Kellie Simmering. They are not in office today. DEA records pulled no narcotic abuse.  2.  Also would like flu vaccine.  Past Medical History  Diagnosis Date  . Other and unspecified hyperlipidemia 401.9  . Hypothyroidism   . Obesity, unspecified   . Gout   . Low back pain   . Rheumatoid arthritis   . Cough 11/12/2010  . Hypertension    Past Surgical History  Procedure Date  . Cholecystectomy   . Total abdominal hysterectomy    History   Social History  . Marital Status: Legally Separated    Spouse Name: N/A    Number of Children: N/A  . Years of Education: N/A   Social History Main Topics  . Smoking status: Former Smoker -- 1.5 packs/day for 36 years    Types: Cigarettes    Quit date: 07/05/1999  . Smokeless tobacco: Never Used   Comment: 1ppd x 36 years  . Alcohol Use: No  . Drug Use: No  . Sexually Active: None   Other Topics Concern  . None   Social History Narrative  . None   Family History  Problem Relation Age of Onset  . Cancer Father   . Heart disease Mother    Allergies  Allergen Reactions  . Ace Inhibitors     cough  . Allopurinol   . Cephalexin   . Nitrofurantoin   . Sulfonamide Derivatives    Prior to Admission medications   Medication Sig Start Date End Date Taking? Authorizing Provider  amLODipine (NORVASC) 5 MG tablet Take 1 tablet (5 mg total) by mouth daily. 04/28/12  Yes Laurey Morale, MD  aspirin 81 MG tablet Take 81 mg by mouth daily.     Yes Historical Provider, MD  atorvastatin (LIPITOR) 40 MG tablet Take 20 mg by mouth daily.    Yes Historical  Provider, MD  febuxostat (ULORIC) 40 MG tablet Take 40 mg by mouth daily.     Yes Historical Provider, MD  hydrochlorothiazide (HYDRODIURIL) 50 MG tablet Take 1 tablet (50 mg total) by mouth daily. 11/17/11  Yes Laurey Morale, MD  KLOR-CON M20 20 MEQ tablet TAKE ONE TABLET BY MOUTH EVERY DAY 11/03/11  Yes Laurey Morale, MD  leflunomide (ARAVA) 20 MG tablet Take 20 mg by mouth daily.     Yes Historical Provider, MD  levothyroxine (SYNTHROID, LEVOTHROID) 100 MCG tablet Take 1 tablet by mouth Daily. 01/24/12  Yes Historical Provider, MD  metoprolol succinate (TOPROL-XL) 100 MG 24 hr tablet TAKE ONE TABLET BY MOUTH TWICE DAILY 05/13/12  Yes Laurey Morale, MD  Multiple Vitamin (MULTIVITAMIN) capsule Take 1 capsule by mouth daily.     Yes Historical Provider, MD  Omega-3 Fatty Acids (ULTRA OMEGA-3 FISH OIL) 1400 MG CAPS Take 1 tablet by mouth.     Yes Historical Provider, MD  SPIRIVA HANDIHALER 18 MCG inhalation capsule INHALE ONE DOSE BY MOUTH EVERY DAY 11/17/11 11/16/12 Yes Kalman Shan, MD  telmisartan (MICARDIS) 80 MG tablet Take 1 tablet (80 mg total) by mouth  daily. 11/17/11  Yes Laurey Morale, MD  traMADol (ULTRAM) 50 MG tablet Take 50 mg by mouth every 6 (six) hours as needed.   Yes Historical Provider, MD  warfarin (COUMADIN) 5 MG tablet Take 1 tablet (5 mg total) by mouth daily. As directed. 03/02/12  Yes Lowella Dell, MD  predniSONE (DELTASONE) 5 MG tablet Take 5 mg by mouth every other day.    Historical Provider, MD     ROS: The patient denies fevers, chills, night sweats, unintentional weight loss, chest pain, palpitations, wheezing, dyspnea on exertion, nausea, vomiting, abdominal pain, dysuria, hematuria, melena, numbness, weakness, or tingling.   All other systems have been reviewed and were otherwise negative with the exception of those mentioned in the HPI and as above.    PHYSICAL EXAM: Filed Vitals:   05/28/12 0806  BP: 150/90  Pulse: 93  Temp: 98 F (36.7 C)  Resp:  18   Filed Vitals:   05/28/12 0806  Height: 5\' 3"  (1.6 m)  Weight: 188 lb (85.276 kg)   Body mass index is 33.30 kg/(m^2).  General: Alert, no acute distress HEENT:  Normocephalic, atraumatic, oropharynx patent.  Cardiovascular:  Regular rate and rhythm, no rubs , + systolic murmurs or gallops.  No Carotid bruits, radial pulse intact. No pedal edema.  Respiratory: Clear to auscultation bilaterally.  No wheezes, rales, or rhonchi.  No cyanosis, no use of accessory musculature GI: No organomegaly, abdomen is soft and non-tender, positive bowel sounds.  No masses. Skin: No rashes. Neurologic: Facial musculature symmetric. Psychiatric: Patient is appropriate throughout our interaction. Lymphatic: No cervical lymphadenopathy Musculoskeletal: Gait intact.   LABS: Results for orders placed in visit on 05/25/12  CBC WITH DIFFERENTIAL      Component Value Range   WBC 10.0  3.9 - 10.3 10e3/uL   NEUT# 6.6 (*) 1.5 - 6.5 10e3/uL   HGB 11.1 (*) 11.6 - 15.9 g/dL   HCT 16.1 (*) 09.6 - 04.5 %   Platelets 184  145 - 400 10e3/uL   MCV 81.6  79.5 - 101.0 fL   MCH 26.2  25.1 - 34.0 pg   MCHC 32.1  31.5 - 36.0 g/dL   RBC 4.09  8.11 - 9.14 10e6/uL   RDW 15.3 (*) 11.2 - 14.5 %   lymph# 2.2  0.9 - 3.3 10e3/uL   MONO# 0.7  0.1 - 0.9 10e3/uL   Eosinophils Absolute 0.4  0.0 - 0.5 10e3/uL   Basophils Absolute 0.1  0.0 - 0.1 10e3/uL   NEUT% 66.3  38.4 - 76.8 %   LYMPH% 22.1  14.0 - 49.7 %   MONO% 7.4  0.0 - 14.0 %   EOS% 3.7  0.0 - 7.0 %   BASO% 0.5  0.0 - 2.0 %  PROTIME-INR      Component Value Range   Protime 30.0 (*) 10.6 - 13.4 Seconds   INR 2.50  2.00 - 3.50   Lovenox No       EKG/XRAY:   Primary read interpreted by Dr. Conley Rolls at [facility].   ASSESSMENT/PLAN: Encounter Diagnoses  Name Primary?  . Back pain Yes  . Flu vaccine need    Refill Tramadol x 1 Flu vaccine given    Marcello Tuzzolino PHUONG, DO 05/28/2012 8:54 AM

## 2012-06-01 ENCOUNTER — Other Ambulatory Visit: Payer: Medicare Other | Admitting: Lab

## 2012-06-10 ENCOUNTER — Other Ambulatory Visit: Payer: Self-pay | Admitting: Family Medicine

## 2012-06-18 ENCOUNTER — Other Ambulatory Visit: Payer: Self-pay | Admitting: Internal Medicine

## 2012-06-21 ENCOUNTER — Telehealth: Payer: Self-pay | Admitting: Internal Medicine

## 2012-06-21 MED ORDER — TIOTROPIUM BROMIDE MONOHYDRATE 18 MCG IN CAPS: 18.0000 ug | ORAL_CAPSULE | Freq: Every day | RESPIRATORY_TRACT | Status: DC

## 2012-06-21 NOTE — Telephone Encounter (Signed)
Called and spoke with patient requesting refill on spiriva.  Medication Refill sent to verified pharmacy, patient verbalized understanding of this and nothing further needed at this time.

## 2012-06-22 ENCOUNTER — Other Ambulatory Visit (HOSPITAL_BASED_OUTPATIENT_CLINIC_OR_DEPARTMENT_OTHER): Payer: Medicare Other | Admitting: Lab

## 2012-06-22 DIAGNOSIS — D6859 Other primary thrombophilia: Secondary | ICD-10-CM

## 2012-06-22 DIAGNOSIS — D6861 Antiphospholipid syndrome: Secondary | ICD-10-CM

## 2012-06-22 LAB — CBC WITH DIFFERENTIAL/PLATELET
Basophils Absolute: 0 10*3/uL (ref 0.0–0.1)
EOS%: 5.1 % (ref 0.0–7.0)
Eosinophils Absolute: 0.4 10*3/uL (ref 0.0–0.5)
HCT: 36 % (ref 34.8–46.6)
HGB: 11.4 g/dL — ABNORMAL LOW (ref 11.6–15.9)
MCH: 26.1 pg (ref 25.1–34.0)
MCV: 82.4 fL (ref 79.5–101.0)
MONO%: 6.9 % (ref 0.0–14.0)
NEUT#: 4.5 10*3/uL (ref 1.5–6.5)
NEUT%: 62.2 % (ref 38.4–76.8)
Platelets: 170 10*3/uL (ref 145–400)
RDW: 15.5 % — ABNORMAL HIGH (ref 11.2–14.5)

## 2012-06-22 LAB — PROTIME-INR: INR: 3.5 (ref 2.00–3.50)

## 2012-06-29 ENCOUNTER — Other Ambulatory Visit: Payer: Medicare Other | Admitting: Lab

## 2012-07-20 ENCOUNTER — Other Ambulatory Visit (HOSPITAL_BASED_OUTPATIENT_CLINIC_OR_DEPARTMENT_OTHER): Payer: Medicare Other

## 2012-07-20 DIAGNOSIS — D6859 Other primary thrombophilia: Secondary | ICD-10-CM

## 2012-07-20 DIAGNOSIS — D6861 Antiphospholipid syndrome: Secondary | ICD-10-CM

## 2012-07-20 LAB — CBC WITH DIFFERENTIAL/PLATELET
Basophils Absolute: 0 10*3/uL (ref 0.0–0.1)
Eosinophils Absolute: 0.4 10*3/uL (ref 0.0–0.5)
HCT: 35 % (ref 34.8–46.6)
HGB: 11 g/dL — ABNORMAL LOW (ref 11.6–15.9)
MCV: 82.4 fL (ref 79.5–101.0)
NEUT#: 4.9 10*3/uL (ref 1.5–6.5)
NEUT%: 63.4 % (ref 38.4–76.8)
RDW: 15.5 % — ABNORMAL HIGH (ref 11.2–14.5)
lymph#: 1.7 10*3/uL (ref 0.9–3.3)

## 2012-07-20 LAB — PROTIME-INR: INR: 2.5 (ref 2.00–3.50)

## 2012-07-27 ENCOUNTER — Other Ambulatory Visit: Payer: Medicare Other | Admitting: Lab

## 2012-08-03 ENCOUNTER — Other Ambulatory Visit: Payer: Medicare Other | Admitting: Lab

## 2012-08-17 ENCOUNTER — Other Ambulatory Visit: Payer: Medicare Other | Admitting: Lab

## 2012-08-20 ENCOUNTER — Other Ambulatory Visit (HOSPITAL_BASED_OUTPATIENT_CLINIC_OR_DEPARTMENT_OTHER): Payer: Medicare Other | Admitting: Lab

## 2012-08-20 DIAGNOSIS — D6859 Other primary thrombophilia: Secondary | ICD-10-CM

## 2012-08-20 DIAGNOSIS — D6861 Antiphospholipid syndrome: Secondary | ICD-10-CM

## 2012-08-20 LAB — CBC WITH DIFFERENTIAL/PLATELET
BASO%: 0.6 % (ref 0.0–2.0)
HCT: 34.9 % (ref 34.8–46.6)
HGB: 11 g/dL — ABNORMAL LOW (ref 11.6–15.9)
MCHC: 31.5 g/dL (ref 31.5–36.0)
MONO#: 0.6 10*3/uL (ref 0.1–0.9)
NEUT#: 5.6 10*3/uL (ref 1.5–6.5)
NEUT%: 67.9 % (ref 38.4–76.8)
WBC: 8.2 10*3/uL (ref 3.9–10.3)
lymph#: 1.6 10*3/uL (ref 0.9–3.3)

## 2012-08-20 LAB — PROTIME-INR: INR: 3.3 (ref 2.00–3.50)

## 2012-08-24 ENCOUNTER — Telehealth: Payer: Self-pay | Admitting: *Deleted

## 2012-08-24 ENCOUNTER — Other Ambulatory Visit: Payer: Medicare Other | Admitting: Lab

## 2012-08-24 NOTE — Telephone Encounter (Signed)
This RN called pt and left message on identified VM informing her per MD review pt is to continue same dose of coumadin and keep scheduled lab appts.

## 2012-08-31 ENCOUNTER — Other Ambulatory Visit: Payer: Medicare Other | Admitting: Lab

## 2012-09-14 ENCOUNTER — Other Ambulatory Visit: Payer: Medicare Other | Admitting: Lab

## 2012-09-20 ENCOUNTER — Other Ambulatory Visit: Payer: Self-pay | Admitting: Oncology

## 2012-09-28 ENCOUNTER — Other Ambulatory Visit: Payer: Medicare Other | Admitting: Lab

## 2012-10-05 ENCOUNTER — Other Ambulatory Visit: Payer: Medicare Other | Admitting: Lab

## 2012-10-11 ENCOUNTER — Telehealth: Payer: Self-pay | Admitting: *Deleted

## 2012-10-11 NOTE — Telephone Encounter (Signed)
Per patient request I have moved her lab appt down to later in the day. JMW

## 2012-10-12 ENCOUNTER — Other Ambulatory Visit (HOSPITAL_BASED_OUTPATIENT_CLINIC_OR_DEPARTMENT_OTHER): Payer: Medicare HMO

## 2012-10-12 DIAGNOSIS — D6861 Antiphospholipid syndrome: Secondary | ICD-10-CM

## 2012-10-12 DIAGNOSIS — D6859 Other primary thrombophilia: Secondary | ICD-10-CM

## 2012-10-12 LAB — CBC WITH DIFFERENTIAL/PLATELET
BASO%: 0.5 % (ref 0.0–2.0)
Basophils Absolute: 0 10*3/uL (ref 0.0–0.1)
EOS%: 5 % (ref 0.0–7.0)
HGB: 11.3 g/dL — ABNORMAL LOW (ref 11.6–15.9)
MCH: 26.2 pg (ref 25.1–34.0)
MCHC: 31.7 g/dL (ref 31.5–36.0)
RDW: 15.6 % — ABNORMAL HIGH (ref 11.2–14.5)
lymph#: 2 10*3/uL (ref 0.9–3.3)

## 2012-10-12 LAB — PROTIME-INR
INR: 2.6 (ref 2.00–3.50)
Protime: 31.2 Seconds — ABNORMAL HIGH (ref 10.6–13.4)

## 2012-10-26 ENCOUNTER — Other Ambulatory Visit: Payer: Medicare Other | Admitting: Lab

## 2012-10-26 ENCOUNTER — Other Ambulatory Visit: Payer: Self-pay | Admitting: Cardiology

## 2012-10-26 NOTE — Telephone Encounter (Signed)
NEED APPOINTMENT, Fax Received. Refill Completed. Sharlet Notaro Chowoe (R.M.A)

## 2012-10-27 ENCOUNTER — Other Ambulatory Visit: Payer: Self-pay | Admitting: *Deleted

## 2012-10-27 DIAGNOSIS — D6861 Antiphospholipid syndrome: Secondary | ICD-10-CM

## 2012-10-27 MED ORDER — AMLODIPINE BESYLATE 5 MG PO TABS: 5.0000 mg | ORAL_TABLET | Freq: Every day | ORAL | Status: DC

## 2012-10-27 MED ORDER — POTASSIUM CHLORIDE CRYS ER 20 MEQ PO TBCR: 20.0000 meq | EXTENDED_RELEASE_TABLET | Freq: Every day | ORAL | Status: DC

## 2012-10-27 MED ORDER — WARFARIN SODIUM 5 MG PO TABS: 5.0000 mg | ORAL_TABLET | ORAL | Status: DC

## 2012-10-27 MED ORDER — HYDROCHLOROTHIAZIDE 50 MG PO TABS: 50.0000 mg | ORAL_TABLET | Freq: Every day | ORAL | Status: DC

## 2012-10-27 MED ORDER — METOPROLOL SUCCINATE ER 100 MG PO TB24: 100.0000 mg | ORAL_TABLET | Freq: Two times a day (BID) | ORAL | Status: DC

## 2012-11-01 ENCOUNTER — Telehealth: Payer: Self-pay | Admitting: Cardiology

## 2012-11-01 NOTE — Telephone Encounter (Signed)
Pt now on [employer] and they will no longer cover her mycardis, can she change med? pls call [phone], uses [employer]

## 2012-11-01 NOTE — Telephone Encounter (Signed)
She can take losartan 50 mg bid or valsartan 240 mg daily.  Needs BMET 1 week after starting new medication.

## 2012-11-01 NOTE — Telephone Encounter (Signed)
Spoke with pt, she was given a 30 day supply of telmisarpan which is generic micardis. Her insurance will no longer cover the micardis and she wants to know what else to take. She has a follow up appt next week with dr Shirlee Latch and can dicuss at that time if needed. Will forward for dr mclean's review.

## 2012-11-02 ENCOUNTER — Other Ambulatory Visit: Payer: Medicare Other | Admitting: Lab

## 2012-11-02 ENCOUNTER — Other Ambulatory Visit: Payer: Self-pay | Admitting: *Deleted

## 2012-11-02 DIAGNOSIS — D6861 Antiphospholipid syndrome: Secondary | ICD-10-CM

## 2012-11-02 NOTE — Telephone Encounter (Signed)
Pt wants to wait till app next week to discuss since she has a 30 day supply of current med, will forward to nurse to update.

## 2012-11-08 ENCOUNTER — Ambulatory Visit (INDEPENDENT_AMBULATORY_CARE_PROVIDER_SITE_OTHER): Payer: Medicare HMO | Admitting: Internal Medicine

## 2012-11-08 ENCOUNTER — Encounter: Payer: Self-pay | Admitting: Internal Medicine

## 2012-11-08 VITALS — BP 142/90 | HR 91 | Temp 97.3°F | Ht 63.0 in | Wt 180.6 lb

## 2012-11-08 DIAGNOSIS — J449 Chronic obstructive pulmonary disease, unspecified: Secondary | ICD-10-CM

## 2012-11-08 NOTE — Progress Notes (Signed)
Subjective:    Patient ID: Karen Chambers, female    DOB: [birth year], 67 y.o.   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  #Gold Stage 2 copd (followed by Dr. Maple Hudson 2005-2006 and Dr. Marchelle Gearing Jan 2012 - date). - MM genotype  - Baseline PFt Aug 2005 - fev1 1.4L/61%, DLCO 74%.   - Class 2 dyspnea with baseline desaturation to 86% walking 185 feet in feb 2012; uses innogen system  - PFTS today 03/03/2011 - FEv1 1.2L/57%, no bd respoinse. dLCO 14.5/60%. This is a 200cc drop in 7 yeears . Rx with spiriva (changed from symbicort)        OV 11/08/2012  - 9 months followup Gold stage II COPD. Last visit was May 2013. She is maintained stability of COPD since then. COPD cat score is down to 11. She is now retired and does enjoy her life. However the main problem is dyspnea that happens when she climbs a hill or flight of stairs. She does not use oxygen at this time. Of note, she is never tender pulmonary rehabilitation but now that she is retired she is open to doing so. She is up-to-date with vaccines  Past, Family, Social reviewed: no change since last visit    CAT COPD Symptom and Quality of Life Score ([employer])  02/10/2012  11/08/2012   Never Cough -> Cough all the time 2 2  No phlegm in chest -> Chest is full of phlegm 2 1  No chest tightness -> Chest feels very tight 0 0  No dyspnea for 1 flight stairs/hill -> Very dyspneic for 1 flight of stairs 4 5  No limitations for ADL at home -> Very limited with ADL at home 3 1  Confident leaving home -> Not at all confident leaving home 0 0  Sleep soundly -> Do not sleep soundly because of lung condition 2 0  Lots of Energy  -> No energy at all 2 2  TOTAL Score (max 40)  15 11       Review of Systems  Constitutional: Negative for fever and unexpected weight change.  HENT: Negative for ear pain, nosebleeds, congestion, sore throat, rhinorrhea, sneezing, trouble swallowing, dental problem, postnasal drip and sinus pressure.   Eyes: Negative for redness and itching.  Respiratory: Negative for cough, chest tightness, shortness of breath and wheezing.   Cardiovascular: Negative for palpitations and leg swelling.  Gastrointestinal: Negative for nausea and vomiting.  Genitourinary: Negative for dysuria.  Musculoskeletal: Negative for joint swelling.  Skin: Negative for rash.  Neurological: Negative for headaches.  Hematological: Does not bruise/bleed easily.  Psychiatric/Behavioral: Negative for dysphoric mood. The patient is not nervous/anxious.        Objective:   Physical Exam Vitals reviewed. Constitutional: She is oriented to person, place, and time. She appears well-developed and well-nourished. No distress.       Obese cushingoid  HENT:  Head: Normocephalic and atraumatic.  Right Ear: External ear normal.  Left Ear: External ear normal.  Mouth/Throat: Oropharynx is clear and moist. Mild post nasal drip + Eyes: Conjunctivae and EOM are normal. Pupils are equal, round, and reactive to light.  Right eye exhibits no discharge. Left eye exhibits no discharge. No scleral icterus.  Neck: Normal range of motion. Neck supple. No JVD present. No tracheal deviation present. No thyromegaly present.  Cardiovascular: Normal rate, regular rhythm and intact distal pulses.  Exam reveals no gallop and no friction rub.   Murmur heard.      Baseline murmur systolic soft  Pulmonary/Chest: Effort normal and breath sounds normal. No respiratory distress. She has no wheezes. She has no rales. She exhibits no tenderness.  Abdominal: Soft. Bowel sounds are normal. She exhibits no distension and no mass. There is no  tenderness. There is no rebound and no guarding.  Musculoskeletal: Normal range of motion. She exhibits no edema and no tenderness.  Lymphadenopathy:    She has no cervical adenopathy.  Neurological: She is alert and oriented to person, place, and time. She has normal reflexes. No cranial nerve deficit. She exhibits normal muscle tone. Coordination normal.  Skin: Skin is warm and dry. No rash noted. She is not diaphoretic. No erythema. No pallor.  Psychiatric: She has a normal mood and affect. Her behavior is normal. Judgment and thought content normal.         Assessment & Plan:

## 2012-11-08 NOTE — Patient Instructions (Addendum)
#  COPD   - continue spiriva 1 puff daily  - use oxygen as needed  -  Attend pulmonary rehabilitation; I have referred you to the program at [place]   #Followup 9 months or sooner if needed Flu shot in the fall

## 2012-11-09 ENCOUNTER — Other Ambulatory Visit (HOSPITAL_BASED_OUTPATIENT_CLINIC_OR_DEPARTMENT_OTHER): Payer: Medicare HMO | Admitting: Lab

## 2012-11-09 ENCOUNTER — Ambulatory Visit (INDEPENDENT_AMBULATORY_CARE_PROVIDER_SITE_OTHER): Payer: Medicare HMO | Admitting: Cardiology

## 2012-11-09 ENCOUNTER — Encounter: Payer: Self-pay | Admitting: Cardiology

## 2012-11-09 VITALS — BP 148/80 | HR 92 | Ht 63.0 in | Wt 178.0 lb

## 2012-11-09 DIAGNOSIS — R0989 Other specified symptoms and signs involving the circulatory and respiratory systems: Secondary | ICD-10-CM

## 2012-11-09 DIAGNOSIS — D6861 Antiphospholipid syndrome: Secondary | ICD-10-CM

## 2012-11-09 DIAGNOSIS — R0609 Other forms of dyspnea: Secondary | ICD-10-CM

## 2012-11-09 DIAGNOSIS — D6859 Other primary thrombophilia: Secondary | ICD-10-CM

## 2012-11-09 DIAGNOSIS — E785 Hyperlipidemia, unspecified: Secondary | ICD-10-CM

## 2012-11-09 DIAGNOSIS — R0600 Dyspnea, unspecified: Secondary | ICD-10-CM

## 2012-11-09 DIAGNOSIS — R011 Cardiac murmur, unspecified: Secondary | ICD-10-CM

## 2012-11-09 DIAGNOSIS — I10 Essential (primary) hypertension: Secondary | ICD-10-CM

## 2012-11-09 DIAGNOSIS — E7800 Pure hypercholesterolemia, unspecified: Secondary | ICD-10-CM

## 2012-11-09 LAB — CBC WITH DIFFERENTIAL/PLATELET
Basophils Absolute: 0 10*3/uL (ref 0.0–0.1)
Eosinophils Absolute: 0.4 10*3/uL (ref 0.0–0.5)
HGB: 11.5 g/dL — ABNORMAL LOW (ref 11.6–15.9)
MONO#: 0.8 10*3/uL (ref 0.1–0.9)
NEUT#: 6.1 10*3/uL (ref 1.5–6.5)
Platelets: 182 10*3/uL (ref 145–400)
RBC: 4.28 10*6/uL (ref 3.70–5.45)
RDW: 16.2 % — ABNORMAL HIGH (ref 11.2–14.5)
WBC: 9.3 10*3/uL (ref 3.9–10.3)

## 2012-11-09 LAB — PROTIME-INR
INR: 2.5 (ref 2.00–3.50)
Protime: 30 Seconds — ABNORMAL HIGH (ref 10.6–13.4)

## 2012-11-09 MED ORDER — LOSARTAN POTASSIUM 100 MG PO TABS: 100.0000 mg | ORAL_TABLET | Freq: Every day | ORAL | Status: DC

## 2012-11-09 NOTE — Progress Notes (Signed)
Patient ID: Karen Chambers, female   DOB: 1946-05-13, 67 y.o.   MRN: [id number] PCP: Georgette Shell  67 yo with history of HTN, hyperlipidemia, COPD, and antiphospholipid antibody syndrome returns for cardiology followup.  She continues to use oxygen at night and on occasion with exertion because of COPD.  She has now retired.  She is not short of breath walking on flat ground though she does get short of breath climbing steps.  No chest pain.  BP is elevated today but has been < 140/90 when she checks at home.  Her insurance will not cover Micardis any longer.   Labs (5/11): K 3.9, creatinine 1.2, HDL 68, LDL 79  Labs (9/11): K 3.1, creatinine 1.3  Labs (10/11): K 4.3, creatinine 1.2  Labs (11/12): BNP 164  ECG: NSR, RBBB, LAFB, narrow inferior Qs  Allergies (verified):  1) ! Allopurinol  2) ! Macrobid  3) ! Keflex  4) ! Sulfa   Past Medical History:  1. Hyperlipidemia  2. HTN: renal artery ultrasound (10/11) with no renal artery stenosis. ACEI cough.  3. Hypothyroidism  4. History of UTIs  5. 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 Followed by Dr. Darnelle Catalan. On permanent warfarin.  Goal INR has been 2.5-3.5. 6. Obesity  7. Gout: questionable. Uric acid normal when checked in 10/11.  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 Echo (8/11): moderate focal basal septal hypertrophy, EF 55-60%, no LV outflow tract gradient, grade I diastolic dysfunction, no regional wall motion abnormalities, no systolic anteiror motion of the mitral valve, PA systolic pressure 30 mmHg.  10. Low back pain  11. Rheumatoid arthritis on MTX  12. COPD on home oxygen  Family History:  Mother with "enlarged heart," probably died of MI at age 46. Father passed away young from cancer.   Social History:  Works in [employer], [employer]. Prior smoker, quit 2000. Separated from husband. Has children.   ROS: All systems reviewed and negative except as  per HPI.   Current Outpatient Prescriptions  Medication Sig Dispense Refill  . amLODipine (NORVASC) 5 MG tablet Take 1 tablet (5 mg total) by mouth daily.  90 tablet  0  . aspirin 81 MG tablet Take 81 mg by mouth daily.        Marland Kitchen atorvastatin (LIPITOR) 40 MG tablet Take 20 mg by mouth daily.       . febuxostat (ULORIC) 40 MG tablet Take 40 mg by mouth daily.        . hydrochlorothiazide (HYDRODIURIL) 50 MG tablet Take 1 tablet (50 mg total) by mouth daily.  90 tablet  0  . leflunomide (ARAVA) 20 MG tablet Take 20 mg by mouth daily.        Marland Kitchen levothyroxine (SYNTHROID, LEVOTHROID) 100 MCG tablet Take 1 tablet by mouth Daily.      . metoprolol succinate (TOPROL-XL) 100 MG 24 hr tablet Take 1 tablet (100 mg total) by mouth 2 (two) times daily. Take with or immediately following a meal.  180 tablet  0  . Multiple Vitamin (MULTIVITAMIN) capsule Take 1 capsule by mouth daily.        . Omega-3 Fatty Acids (ULTRA OMEGA-3 FISH OIL) 1400 MG CAPS Take 1 tablet by mouth.        . potassium chloride SA (KLOR-CON M20) 20 MEQ tablet Take 1 tablet (20 mEq total) by mouth daily.  90 tablet  0  . tiotropium (SPIRIVA HANDIHALER) 18 MCG inhalation capsule Place 1  capsule (18 mcg total) into inhaler and inhale daily.  30 capsule  6  . traMADol (ULTRAM) 50 MG tablet Take 1 tablet (50 mg total) by mouth every 6 (six) hours as needed.  70 tablet  0  . warfarin (COUMADIN) 5 MG tablet Take 1 tablet (5 mg total) by mouth as directed.  60 tablet  3  . losartan (COZAAR) 100 MG tablet Take 1 tablet (100 mg total) by mouth daily.  90 tablet  3   No current facility-administered medications for this visit.    BP 148/80  Pulse 92  Ht 5\' 3"  (1.6 m)  Wt 178 lb (80.74 kg)  BMI 31.54 kg/m2 General: NAD, obese Neck: No JVD, no thyromegaly or thyroid nodule.  Lungs: Clear to auscultation bilaterally with normal respiratory effort. CV: Nondisplaced PMI.  Heart regular S1/S2, no S3/S4, 2/6 SEM RUSB.  No edema.  No carotid bruit.   Normal pedal pulses.  Abdomen: Soft, nontender, no hepatosplenomegaly, no distention.  Neurologic: Alert and oriented x 3.  Psych: Normal affect. Extremities: No clubbing or cyanosis.   Assessment/Plan: 1. Antiphospholipid antibody syndrome: No thrombotic event in the last few years. She is on warfarin with goal INR 2.5-3.5.  2. HTN: She will need to stop Micardis (no longer covered by her insurance).  I will have her take losartan 100 mg daily.  Will get BMET in 2 wks.  She will check her BP daily after med change and we will call to see what her readings are.   3. Hyperlipidemia: I will check lipids when she gets her BMET.  She is on atorvastatin.  4. Dyspnea: Stable, suspect primarily due to COPD.  She continues to wear oxygen at night and with heavy exertion.  5. Murmur: Stable.  Based on last echo, may be due to turbulence across LV outflow tract though no significant gradient was noted.   Marca Ancona 11/09/2012

## 2012-11-09 NOTE — Patient Instructions (Addendum)
Stop micardis.  Start losartan 100mg  daily.   Your physician recommends that you return for a FASTING lipid profile /BMET/TSH in 1 month.   I will call you in about a month to see how your blood pressure is on the new medication.   Your physician wants you to follow-up in: 1 year with Dr Shirlee Latch. (February 2015).  You will receive a reminder letter in the mail two months in advance. If you don't receive a letter, please call our office to schedule the follow-up appointment.

## 2012-11-11 NOTE — Assessment & Plan Note (Signed)
#  COPD -Disease is stable Plan   - continue spiriva 1 puff daily  - use oxygen as needed  -  Attend pulmonary rehabilitation; I have referred you to the program at [place]   #Followup 9 months or sooner if needed Flu shot in the fall

## 2012-11-23 ENCOUNTER — Other Ambulatory Visit: Payer: Medicare Other | Admitting: Lab

## 2012-11-30 ENCOUNTER — Other Ambulatory Visit: Payer: Medicare Other | Admitting: Lab

## 2012-12-07 ENCOUNTER — Telehealth: Payer: Self-pay | Admitting: *Deleted

## 2012-12-07 ENCOUNTER — Other Ambulatory Visit: Payer: Medicare Other | Admitting: Lab

## 2012-12-07 ENCOUNTER — Other Ambulatory Visit: Payer: Medicare HMO

## 2012-12-07 NOTE — Telephone Encounter (Signed)
HTN: She will need to stop Micardis (no longer covered by her insurance). I will have her take losartan 100 mg daily. Will get BMET in 2 wks. She will check her BP daily after med change and we will call to see what her readings are.   12/07/12 spoke with pt. Pt states BP has been doing good since making medication change--in the 130-140/80 range. She is coming in for lab 12/09/12.

## 2012-12-09 ENCOUNTER — Other Ambulatory Visit (HOSPITAL_BASED_OUTPATIENT_CLINIC_OR_DEPARTMENT_OTHER): Payer: Medicare HMO | Admitting: Lab

## 2012-12-09 ENCOUNTER — Ambulatory Visit (INDEPENDENT_AMBULATORY_CARE_PROVIDER_SITE_OTHER): Payer: Medicare HMO | Admitting: *Deleted

## 2012-12-09 LAB — CBC WITH DIFFERENTIAL/PLATELET
Basophils Absolute: 0.1 10*3/uL (ref 0.0–0.1)
Eosinophils Absolute: 0.2 10*3/uL (ref 0.0–0.5)
HGB: 11.7 g/dL (ref 11.6–15.9)
MONO%: 8 % (ref 0.0–14.0)
NEUT#: 7.7 10*3/uL — ABNORMAL HIGH (ref 1.5–6.5)
RBC: 4.41 10*6/uL (ref 3.70–5.45)
RDW: 15.2 % — ABNORMAL HIGH (ref 11.2–14.5)
WBC: 10.3 10*3/uL (ref 3.9–10.3)
lymph#: 1.5 10*3/uL (ref 0.9–3.3)
nRBC: 0 % (ref 0–0)

## 2012-12-09 LAB — BASIC METABOLIC PANEL
GFR: 42.5 mL/min — ABNORMAL LOW (ref >60.00)
Potassium: 3.5 mEq/L (ref 3.5–5.1)
Sodium: 141 mEq/L (ref 135–145)

## 2012-12-09 LAB — LIPID PANEL
Cholesterol: 215 mg/dL — ABNORMAL HIGH (ref 0–200)
HDL: 50.1 mg/dL (ref >39.00)
Triglycerides: 244 mg/dL — ABNORMAL HIGH (ref 0.0–149.0)

## 2012-12-09 LAB — PROTIME-INR
INR: 2.8 (ref 2.00–3.50)
Protime: 33.6 Seconds — ABNORMAL HIGH (ref 10.6–13.4)

## 2012-12-09 LAB — LDL CHOLESTEROL, DIRECT: Direct LDL: 124.2 mg/dL

## 2012-12-09 LAB — TSH: TSH: 0.49 u[IU]/mL (ref 0.35–5.50)

## 2012-12-14 NOTE — Telephone Encounter (Signed)
Pt.notified

## 2012-12-14 NOTE — Telephone Encounter (Signed)
OK, continue as is.

## 2012-12-17 ENCOUNTER — Other Ambulatory Visit: Payer: Self-pay | Admitting: *Deleted

## 2012-12-17 DIAGNOSIS — D6861 Antiphospholipid syndrome: Secondary | ICD-10-CM

## 2012-12-17 MED ORDER — ATORVASTATIN CALCIUM 20 MG PO TABS: 20.0000 mg | ORAL_TABLET | Freq: Every day | ORAL | Status: DC

## 2012-12-17 MED ORDER — LEVOTHYROXINE SODIUM 100 MCG PO TABS: 100.0000 ug | ORAL_TABLET | Freq: Every day | ORAL | Status: DC

## 2012-12-17 NOTE — Progress Notes (Signed)
Called  Patient to inform her of Dr. Alford Highland order to repeat lipid profile in 1 month.  Order entered

## 2012-12-21 ENCOUNTER — Other Ambulatory Visit: Payer: Medicare Other | Admitting: Lab

## 2012-12-22 ENCOUNTER — Other Ambulatory Visit: Payer: Self-pay | Admitting: *Deleted

## 2012-12-22 MED ORDER — AMLODIPINE BESYLATE 5 MG PO TABS: 5.0000 mg | ORAL_TABLET | Freq: Every day | ORAL | Status: DC

## 2012-12-22 MED ORDER — METOPROLOL SUCCINATE ER 100 MG PO TB24: 100.0000 mg | ORAL_TABLET | Freq: Two times a day (BID) | ORAL | Status: DC

## 2012-12-24 ENCOUNTER — Other Ambulatory Visit: Payer: Self-pay | Admitting: *Deleted

## 2012-12-24 MED ORDER — HYDROCHLOROTHIAZIDE 50 MG PO TABS: 50.0000 mg | ORAL_TABLET | Freq: Every day | ORAL | Status: DC

## 2012-12-27 ENCOUNTER — Other Ambulatory Visit: Payer: Self-pay | Admitting: *Deleted

## 2012-12-27 MED ORDER — POTASSIUM CHLORIDE CRYS ER 20 MEQ PO TBCR: 20.0000 meq | EXTENDED_RELEASE_TABLET | Freq: Every day | ORAL | Status: DC

## 2012-12-28 ENCOUNTER — Other Ambulatory Visit: Payer: Medicare Other | Admitting: Lab

## 2013-01-04 ENCOUNTER — Other Ambulatory Visit: Payer: Medicare Other | Admitting: Lab

## 2013-02-01 ENCOUNTER — Other Ambulatory Visit: Payer: Medicare Other | Admitting: Lab

## 2013-02-01 ENCOUNTER — Other Ambulatory Visit (HOSPITAL_BASED_OUTPATIENT_CLINIC_OR_DEPARTMENT_OTHER): Payer: Medicare HMO | Admitting: Lab

## 2013-02-01 ENCOUNTER — Ambulatory Visit: Payer: Medicare Other | Admitting: Oncology

## 2013-02-01 DIAGNOSIS — D6861 Antiphospholipid syndrome: Secondary | ICD-10-CM

## 2013-02-01 DIAGNOSIS — D6859 Other primary thrombophilia: Secondary | ICD-10-CM

## 2013-02-01 LAB — CBC WITH DIFFERENTIAL/PLATELET
BASO%: 0.3 % (ref 0.0–2.0)
EOS%: 4.8 % (ref 0.0–7.0)
HCT: 35.2 % (ref 34.8–46.6)
LYMPH%: 23.5 % (ref 14.0–49.7)
MCH: 26.3 pg (ref 25.1–34.0)
MCHC: 31.3 g/dL — ABNORMAL LOW (ref 31.5–36.0)
MONO#: 0.8 10*3/uL (ref 0.1–0.9)
NEUT%: 63.1 % (ref 38.4–76.8)
Platelets: 200 10*3/uL (ref 145–400)
RBC: 4.19 10*6/uL (ref 3.70–5.45)
WBC: 9.3 10*3/uL (ref 3.9–10.3)
lymph#: 2.2 10*3/uL (ref 0.9–3.3)
nRBC: 0 % (ref 0–0)

## 2013-02-01 LAB — PROTIME-INR
INR: 3 (ref 2.00–3.50)
Protime: 36 Seconds — ABNORMAL HIGH (ref 10.6–13.4)

## 2013-02-08 ENCOUNTER — Ambulatory Visit (HOSPITAL_BASED_OUTPATIENT_CLINIC_OR_DEPARTMENT_OTHER): Payer: Medicare HMO | Admitting: Oncology

## 2013-02-08 ENCOUNTER — Telehealth: Payer: Self-pay | Admitting: Oncology

## 2013-02-08 VITALS — BP 123/73 | HR 99 | Temp 98.6°F | Resp 20 | Ht 63.0 in | Wt 165.3 lb

## 2013-02-08 DIAGNOSIS — Z7901 Long term (current) use of anticoagulants: Secondary | ICD-10-CM

## 2013-02-08 DIAGNOSIS — D6861 Antiphospholipid syndrome: Secondary | ICD-10-CM

## 2013-02-08 DIAGNOSIS — M069 Rheumatoid arthritis, unspecified: Secondary | ICD-10-CM

## 2013-02-08 DIAGNOSIS — J449 Chronic obstructive pulmonary disease, unspecified: Secondary | ICD-10-CM

## 2013-02-08 DIAGNOSIS — D6859 Other primary thrombophilia: Secondary | ICD-10-CM

## 2013-02-08 MED ORDER — WARFARIN SODIUM 5 MG PO TABS: ORAL_TABLET | ORAL | Status: DC

## 2013-02-08 NOTE — Progress Notes (Signed)
ID: Karen Chambers   DOB: [birth year]  MR#: [id number]  CSN#:[id number]  PCP: Teena Irani, PA-C GYN: SU: OTHER MD: Stacey Drain, Marca Ancona, Jacinto Halim  INTERVAL HISTORY: Karen Chambers returns today for followup of her coagulopathy. The interval history is unremarkable. She continues to tolerate the warfarin without any unusual complications and particularly no bleeding.  REVIEW OF SYSTEMS: She has a new diagnosis of rheumatoid arthritis, followed by Dr. Kellie Simmering. Her blood pressure is controlled through Dr. Shirlee Latch. She also has been newly diagnosed with COPD and sees Dr. Colletta Maryland twice yearly. Aside from arthritis pain which is not more frequent or intense than prior, a detailed review of systems today was otherwise entirely stable  PAST MEDICAL HISTORY: Past Medical History  Diagnosis Date  . Other and unspecified hyperlipidemia 401.9  . Hypothyroidism   . Obesity, unspecified   . Gout   . Low back pain   . Rheumatoid arthritis   . Cough 11/12/2010  . Hypertension     PAST SURGICAL HISTORY: Past Surgical History  Procedure Laterality Date  . Cholecystectomy    . Total abdominal hysterectomy      FAMILY HISTORY Family History  Problem Relation Age of Onset  . Cancer Father   . Heart disease Mother     HEALTH MAINTENANCE: History  Substance Use Topics  . Smoking status: Former Smoker -- 1.50 packs/day for 36 years    Types: Cigarettes    Quit date: 07/05/1999  . Smokeless tobacco: Never Used     Comment: 1ppd x 36 years  . Alcohol Use: No     Colonoscopy: due 2014; refuses  PAP: n/a  Mammogram: March 2014/[facility]  Lipid panel:  Allergies  Allergen Reactions  . Ace Inhibitors     cough  . Allopurinol   . Cephalexin   . Nitrofurantoin   . Sulfonamide Derivatives     Current Outpatient Prescriptions  Medication Sig Dispense Refill  . amLODipine (NORVASC) 5 MG tablet Take 1 tablet (5 mg total) by mouth daily.  90 tablet  3  . aspirin 81 MG tablet  Take 81 mg by mouth daily.        Marland Kitchen atorvastatin (LIPITOR) 20 MG tablet Take 1 tablet (20 mg total) by mouth daily.  90 tablet  0  . febuxostat (ULORIC) 40 MG tablet Take 40 mg by mouth daily.        . hydrochlorothiazide (HYDRODIURIL) 50 MG tablet Take 1 tablet (50 mg total) by mouth daily.  90 tablet  3  . leflunomide (ARAVA) 20 MG tablet Take 20 mg by mouth daily.        Marland Kitchen levothyroxine (SYNTHROID, LEVOTHROID) 100 MCG tablet Take 1 tablet (100 mcg total) by mouth daily.  90 tablet  0  . losartan (COZAAR) 100 MG tablet Take 1 tablet (100 mg total) by mouth daily.  90 tablet  3  . metoprolol succinate (TOPROL-XL) 100 MG 24 hr tablet Take 1 tablet (100 mg total) by mouth 2 (two) times daily. Take with or immediately following a meal.  180 tablet  3  . Multiple Vitamin (MULTIVITAMIN) capsule Take 1 capsule by mouth daily.        . Omega-3 Fatty Acids (ULTRA OMEGA-3 FISH OIL) 1400 MG CAPS Take 1 tablet by mouth.        . potassium chloride SA (KLOR-CON M20) 20 MEQ tablet Take 1 tablet (20 mEq total) by mouth daily.  90 tablet  3  . tiotropium (SPIRIVA HANDIHALER) 18 MCG inhalation  capsule Place 1 capsule (18 mcg total) into inhaler and inhale daily.  30 capsule  6  . traMADol (ULTRAM) 50 MG tablet Take 1 tablet (50 mg total) by mouth every 6 (six) hours as needed.  70 tablet  0  . warfarin (COUMADIN) 5 MG tablet Take 1 tablet (5 mg total) by mouth as directed.  60 tablet  3   No current facility-administered medications for this visit.    OBJECTIVE: Middle-aged white woman in no acute distress  Filed Vitals:   02/08/13 1401  BP: 123/73  Pulse: 99  Temp: 98.6 F (37 C)  Resp: 20     Body mass index is 29.29 kg/(m^2).    ECOG FS: 1 Sclerae unicteric Oropharynx clear No cervical or supraclavicular adenopathy Lungs no rales or rhonchi Heart regular rate and rhythm, II/ IV SEM as noted Abd obese, benign MSK no focal spinal tenderness Neuro: nonfocal, well oriented, friendly  affect   LAB RESULTS: Lab Results  Component Value Date   WBC 9.3 02/01/2013   NEUTROABS 5.9 02/01/2013   HGB 11.0* 02/01/2013   HCT 35.2 02/01/2013   MCV 84.0 02/01/2013   PLT 200 02/01/2013      Chemistry      Component Value Date/Time   NA 141 12/09/2012 0824   K 3.5 12/09/2012 0824   CL 105 12/09/2012 0824   CO2 27 12/09/2012 0824   BUN 29* 12/09/2012 0824   CREATININE 1.3* 12/09/2012 0824      Component Value Date/Time   CALCIUM 9.0 12/09/2012 0824       No results found for this basename: LABCA2    No components found with this basename: LABCA125    No results found for this basename: INR,  in the last 168 hours  Urinalysis No results found for this basename: colorurine,  appearanceur,  labspec,  phurine,  glucoseu,  hgbur,  bilirubinur,  ketonesur,  proteinur,  urobilinogen,  nitrite,  leukocytesur    STUDIES: No results found.   ASSESSMENT: 67 y.o.  [place] woman with a history of antiphospholipid syndrome with a weakly positive lupus anticoagulant, a persistent elevation of the PTT and elevated anticardiolipin antibodies, with a history of thalamic infarct October 2007, on lifelong Coumadin; other problems including rheumatoid arthritis, gout, hypertension, obesity, and COPD.     PLAN: She is doing very well as far as her coagulopathy is concerned, and we will continue to try to keep her PT/INR between 2.5 and 3.5. She has been a steady dose of Coumadin, namely 7-1/2 mg day 1 and then 5 mg day 2 and day 3, and then repeats. We will follow her labwork every 4 weeks and she will see me again in one year. She knows to call for any other problems that may develop before the next visit  Saydee Zolman C    02/08/2013

## 2013-02-09 ENCOUNTER — Ambulatory Visit (HOSPITAL_COMMUNITY): Payer: Medicare HMO

## 2013-02-14 ENCOUNTER — Other Ambulatory Visit: Payer: Self-pay | Admitting: Internal Medicine

## 2013-02-18 ENCOUNTER — Other Ambulatory Visit: Payer: Self-pay | Admitting: Oncology

## 2013-02-18 DIAGNOSIS — D6859 Other primary thrombophilia: Secondary | ICD-10-CM

## 2013-03-01 ENCOUNTER — Other Ambulatory Visit (HOSPITAL_BASED_OUTPATIENT_CLINIC_OR_DEPARTMENT_OTHER): Payer: Medicare HMO | Admitting: Lab

## 2013-03-01 DIAGNOSIS — D6861 Antiphospholipid syndrome: Secondary | ICD-10-CM

## 2013-03-01 DIAGNOSIS — D6859 Other primary thrombophilia: Secondary | ICD-10-CM

## 2013-03-01 LAB — CBC WITH DIFFERENTIAL/PLATELET
BASO%: 0.4 % (ref 0.0–2.0)
EOS%: 4 % (ref 0.0–7.0)
MCH: 26.2 pg (ref 25.1–34.0)
MCHC: 31.6 g/dL (ref 31.5–36.0)
MONO#: 1 10*3/uL — ABNORMAL HIGH (ref 0.1–0.9)
RBC: 4.39 10*6/uL (ref 3.70–5.45)
RDW: 14.5 % (ref 11.2–14.5)
WBC: 10.3 10*3/uL (ref 3.9–10.3)
lymph#: 2 10*3/uL (ref 0.9–3.3)
nRBC: 0 % (ref 0–0)

## 2013-03-01 LAB — PROTIME-INR: INR: 2.5 (ref 2.00–3.50)

## 2013-03-29 ENCOUNTER — Other Ambulatory Visit (HOSPITAL_BASED_OUTPATIENT_CLINIC_OR_DEPARTMENT_OTHER): Payer: Medicare HMO | Admitting: Lab

## 2013-03-29 DIAGNOSIS — D6861 Antiphospholipid syndrome: Secondary | ICD-10-CM

## 2013-03-29 DIAGNOSIS — D6859 Other primary thrombophilia: Secondary | ICD-10-CM

## 2013-03-29 LAB — PROTIME-INR

## 2013-03-29 LAB — CBC WITH DIFFERENTIAL/PLATELET
BASO%: 0.5 % (ref 0.0–2.0)
Basophils Absolute: 0 10*3/uL (ref 0.0–0.1)
EOS%: 4.7 % (ref 0.0–7.0)
MCH: 26.1 pg (ref 25.1–34.0)
MCHC: 31.8 g/dL (ref 31.5–36.0)
MCV: 82.1 fL (ref 79.5–101.0)
MONO%: 9.4 % (ref 0.0–14.0)
NEUT%: 63.8 % (ref 38.4–76.8)
RDW: 14.7 % — ABNORMAL HIGH (ref 11.2–14.5)
lymph#: 1.9 10*3/uL (ref 0.9–3.3)

## 2013-03-29 LAB — PROTHROMBIN TIME
INR: 3.48 — ABNORMAL HIGH (ref <1.50)
Prothrombin Time: 33.7 seconds — ABNORMAL HIGH (ref 11.6–15.2)

## 2013-04-26 ENCOUNTER — Other Ambulatory Visit (HOSPITAL_BASED_OUTPATIENT_CLINIC_OR_DEPARTMENT_OTHER): Payer: Medicare HMO | Admitting: Lab

## 2013-04-26 DIAGNOSIS — D6859 Other primary thrombophilia: Secondary | ICD-10-CM

## 2013-04-26 DIAGNOSIS — D6861 Antiphospholipid syndrome: Secondary | ICD-10-CM

## 2013-04-26 LAB — CBC WITH DIFFERENTIAL/PLATELET
Basophils Absolute: 0.1 10*3/uL (ref 0.0–0.1)
EOS%: 5.1 % (ref 0.0–7.0)
Eosinophils Absolute: 0.4 10*3/uL (ref 0.0–0.5)
HGB: 11.4 g/dL — ABNORMAL LOW (ref 11.6–15.9)
MONO#: 0.8 10*3/uL (ref 0.1–0.9)
NEUT#: 5.2 10*3/uL (ref 1.5–6.5)
RDW: 16.3 % — ABNORMAL HIGH (ref 11.2–14.5)
WBC: 8.3 10*3/uL (ref 3.9–10.3)
lymph#: 1.8 10*3/uL (ref 0.9–3.3)

## 2013-04-26 LAB — PROTIME-INR: INR: 3.8 — ABNORMAL HIGH (ref 2.00–3.50)

## 2013-05-24 ENCOUNTER — Other Ambulatory Visit (HOSPITAL_BASED_OUTPATIENT_CLINIC_OR_DEPARTMENT_OTHER): Payer: Medicare HMO | Admitting: Lab

## 2013-05-24 DIAGNOSIS — D6861 Antiphospholipid syndrome: Secondary | ICD-10-CM

## 2013-05-24 DIAGNOSIS — D6859 Other primary thrombophilia: Secondary | ICD-10-CM

## 2013-05-24 LAB — CBC WITH DIFFERENTIAL/PLATELET
Basophils Absolute: 0 10*3/uL (ref 0.0–0.1)
EOS%: 1 % (ref 0.0–7.0)
HGB: 11.1 g/dL — ABNORMAL LOW (ref 11.6–15.9)
MCH: 25.6 pg (ref 25.1–34.0)
NEUT#: 6.8 10*3/uL — ABNORMAL HIGH (ref 1.5–6.5)
RDW: 15.1 % — ABNORMAL HIGH (ref 11.2–14.5)
WBC: 8.3 10*3/uL (ref 3.9–10.3)
lymph#: 1.1 10*3/uL (ref 0.9–3.3)

## 2013-05-24 LAB — PROTIME-INR: INR: 1.9 — ABNORMAL LOW (ref 2.00–3.50)

## 2013-06-21 ENCOUNTER — Other Ambulatory Visit (HOSPITAL_BASED_OUTPATIENT_CLINIC_OR_DEPARTMENT_OTHER): Payer: Medicare HMO | Admitting: Lab

## 2013-06-21 DIAGNOSIS — D6861 Antiphospholipid syndrome: Secondary | ICD-10-CM

## 2013-06-21 DIAGNOSIS — D6859 Other primary thrombophilia: Secondary | ICD-10-CM

## 2013-06-21 LAB — CBC WITH DIFFERENTIAL/PLATELET
BASO%: 0.5 % (ref 0.0–2.0)
Basophils Absolute: 0 10*3/uL (ref 0.0–0.1)
EOS%: 5 % (ref 0.0–7.0)
HGB: 11 g/dL — ABNORMAL LOW (ref 11.6–15.9)
MCH: 26.2 pg (ref 25.1–34.0)
MCHC: 31.7 g/dL (ref 31.5–36.0)
RDW: 15.2 % — ABNORMAL HIGH (ref 11.2–14.5)
lymph#: 1.7 10*3/uL (ref 0.9–3.3)
nRBC: 0 % (ref 0–0)

## 2013-06-21 LAB — PROTIME-INR
INR: 3.7 — ABNORMAL HIGH (ref 2.00–3.50)
Protime: 44.4 Seconds — ABNORMAL HIGH (ref 10.6–13.4)

## 2013-07-19 ENCOUNTER — Other Ambulatory Visit (HOSPITAL_BASED_OUTPATIENT_CLINIC_OR_DEPARTMENT_OTHER): Payer: Medicare HMO | Admitting: Lab

## 2013-07-19 DIAGNOSIS — D6861 Antiphospholipid syndrome: Secondary | ICD-10-CM

## 2013-07-19 DIAGNOSIS — D6859 Other primary thrombophilia: Secondary | ICD-10-CM

## 2013-07-19 LAB — CBC WITH DIFFERENTIAL/PLATELET
EOS%: 4.8 % (ref 0.0–7.0)
MCH: 25.5 pg (ref 25.1–34.0)
MCV: 81.9 fL (ref 79.5–101.0)
MONO%: 6.3 % (ref 0.0–14.0)
RBC: 4.15 10*6/uL (ref 3.70–5.45)
RDW: 14.7 % — ABNORMAL HIGH (ref 11.2–14.5)
nRBC: 0 % (ref 0–0)

## 2013-07-19 LAB — PROTIME-INR: Protime: 43.2 Seconds — ABNORMAL HIGH (ref 10.6–13.4)

## 2013-08-02 ENCOUNTER — Other Ambulatory Visit: Payer: Self-pay | Admitting: Rheumatology

## 2013-08-02 ENCOUNTER — Ambulatory Visit
Admission: RE | Admit: 2013-08-02 | Discharge: 2013-08-02 | Disposition: A | Discharge status: Home / Self Care | Payer: Commercial Managed Care - HMO | Source: Ambulatory Visit | Attending: Rheumatology | Admitting: Rheumatology

## 2013-08-02 DIAGNOSIS — M545 Low back pain: Secondary | ICD-10-CM

## 2013-08-08 ENCOUNTER — Ambulatory Visit: Payer: Medicare HMO | Admitting: Internal Medicine

## 2013-08-16 ENCOUNTER — Encounter (INDEPENDENT_AMBULATORY_CARE_PROVIDER_SITE_OTHER): Payer: Self-pay

## 2013-08-16 ENCOUNTER — Other Ambulatory Visit (HOSPITAL_BASED_OUTPATIENT_CLINIC_OR_DEPARTMENT_OTHER): Payer: Medicare HMO | Admitting: Lab

## 2013-08-16 DIAGNOSIS — D6861 Antiphospholipid syndrome: Secondary | ICD-10-CM

## 2013-08-16 DIAGNOSIS — D6859 Other primary thrombophilia: Secondary | ICD-10-CM

## 2013-08-16 LAB — CBC WITH DIFFERENTIAL/PLATELET
BASO%: 0.4 % (ref 0.0–2.0)
EOS%: 2.8 % (ref 0.0–7.0)
HGB: 9.9 g/dL — ABNORMAL LOW (ref 11.6–15.9)
MCH: 25.9 pg (ref 25.1–34.0)
MCHC: 31.2 g/dL — ABNORMAL LOW (ref 31.5–36.0)
MCV: 83 fL (ref 79.5–101.0)
MONO%: 6.8 % (ref 0.0–14.0)
RBC: 3.82 10*6/uL (ref 3.70–5.45)
RDW: 15.9 % — ABNORMAL HIGH (ref 11.2–14.5)
WBC: 7.8 10*3/uL (ref 3.9–10.3)
lymph#: 1.4 10*3/uL (ref 0.9–3.3)
nRBC: 0 % (ref 0–0)

## 2013-08-16 LAB — PROTIME-INR

## 2013-08-19 ENCOUNTER — Telehealth: Payer: Self-pay | Admitting: *Deleted

## 2013-08-19 DIAGNOSIS — D6861 Antiphospholipid syndrome: Secondary | ICD-10-CM

## 2013-08-19 NOTE — Telephone Encounter (Signed)
Patient calling in to report she has not heard from office regarding labs done on Tuesday. Her PT/INR was elevated and had to be sent out for confirmation. She said with her history(14years) and knowing how she runs, she has not taken any Coumadin since that level was drawn and believes she needs to restart tonight. She states she was previously on 7.5mg , 5mg , 5mg , then repeat. She states she now plans to cut back to 5mg  daily unless Dr Darnelle Catalan feels she should be on anything different. Consulted with our pharmacist here at [facility] in Dr Princella Pellegrini absence, and they feel that would be appropriate. We will bring patient back sooner than scheduled to recheck her labs to check this Coumadin adjustment on 09/01/13. Informed patient that we would be in touch if needed once Dr Darnelle Catalan reviews this plan.  Patient understands to call us for any problems or concerns, bleeding or questions.

## 2013-08-19 NOTE — Telephone Encounter (Signed)
Duplicate

## 2013-08-22 ENCOUNTER — Telehealth: Payer: Self-pay | Admitting: Oncology

## 2013-08-22 NOTE — Telephone Encounter (Signed)
lmonvm for pt re add on appt for 12/11. Schedule mailed.

## 2013-08-29 ENCOUNTER — Ambulatory Visit (INDEPENDENT_AMBULATORY_CARE_PROVIDER_SITE_OTHER): Payer: Medicare HMO | Admitting: Internal Medicine

## 2013-08-29 ENCOUNTER — Encounter: Payer: Self-pay | Admitting: Internal Medicine

## 2013-08-29 VITALS — BP 150/90 | HR 90 | Ht 63.0 in | Wt 178.0 lb

## 2013-08-29 DIAGNOSIS — J4489 Other specified chronic obstructive pulmonary disease: Secondary | ICD-10-CM

## 2013-08-29 DIAGNOSIS — J449 Chronic obstructive pulmonary disease, unspecified: Secondary | ICD-10-CM

## 2013-08-29 NOTE — Patient Instructions (Signed)
#  COPD   - continue spiriva 1 puff daily  - use oxygen as needed  -try dymista for nasal drainage; could help shortness of breath - glad you had flu shot  #Followup 9 months or sooner if needed 

## 2013-08-29 NOTE — Progress Notes (Signed)
Subjective:    Patient ID: Karen Chambers, female    DOB: 01-22-1946, 67 y.o.   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  #Gold Stage 2 copd (followed by Dr. Maple Hudson 2005-2006 and Dr. Marchelle Gearing Jan 2012 - date). - MM genotype  - Baseline PFt Aug 2005 - fev1 1.4L/61%, DLCO 74%.   - Class 2 dyspnea with baseline desaturation to 86% walking 185 feet in feb 2012; uses innogen system  - PFTS today 03/03/2011 - FEv1 1.2L/57%, no bd respoinse. dLCO 14.5/60%. This is a 200cc drop in 7 yeears . Rx with spiriva (changed from symbicort)    OV 11/08/2012  - 9 months followup Gold stage II COPD. Last visit was May 2013. She is maintained stability of COPD since then. COPD cat score is down to 11. She is now retired and does enjoy her life. However the main problem is dyspnea that happens when she climbs a hill or flight of stairs. She does not use oxygen at this time. Of note, she is never tender pulmonary rehabilitation but now that she is retired she is open to doing so. She is up-to-date with vaccines  Past, Family, Social reviewed: no change since last visit  #COPD   - continue spiriva 1 puff daily  - use oxygen as needed  -  Attend pulmonary rehabilitation; I have referred you to the program at [place]   #Followup 9 months or sooner if needed Flu shot in the fall   OV 08/29/2013  Chief Complaint  Patient presents with  . Follow-up    for COPD. Pt denies any changes in SOB, no better, no worse.     Followup COPD moderate disease  Overall COPD stable since the last 9 months I saw her. No history of flareups. She is compliant with Spiriva but is having trouble of ferning due to donut hole.  COPD cat score is only 8 and as detailed below. Uptodate with flu shot.  RA walk 185 feet x did not desaturate; stopped due to back ache  Past medical history: Main issue currently is that she's got back ache with critical up if he and his got a see Dr. Ethelene Hal.   CAT COPD Symptom and Quality of Life Score (glaxo smith kline trademark)  02/10/2012  11/08/2012  08/29/2013   Never Cough -> Cough all the time 2 2 0  No phlegm in chest -> Chest is full of phlegm 2 1 1   No chest tightness -> Chest feels very tight 0 0 0  No dyspnea for 1 flight stairs/hill -> Very dyspneic for 1 flight of stairs 4 5 3   No limitations for ADL at home -> Very limited with ADL at home 3 1 2   Confident leaving home -> Not at all confident leaving home 0 0 0  Sleep soundly -> Do not sleep soundly because of lung condition 2 0 0  Lots of Energy -> No energy at all 2 2 2   TOTAL Score (max 40)  15 11 8           Review of Systems  Constitutional: Negative for fever and unexpected weight change.  HENT: Negative for  congestion, dental problem, ear pain, nosebleeds, postnasal drip, rhinorrhea, sinus pressure, sneezing, sore throat and trouble swallowing.   Eyes: Negative for redness and itching.  Respiratory: Positive for shortness of breath. Negative for cough, chest tightness and wheezing.   Cardiovascular: Negative for palpitations and leg swelling.  Gastrointestinal: Negative for nausea and vomiting.  Genitourinary: Negative for dysuria.  Musculoskeletal: Positive for back pain. Negative for joint swelling.  Skin: Negative for rash.  Neurological: Negative for headaches.  Hematological: Does not bruise/bleed easily.  Psychiatric/Behavioral: Negative for dysphoric mood. The patient is not nervous/anxious.        Objective:   Physical Exam  Vitals reviewed. Constitutional: She is oriented to person, place, and time. She appears well-developed and well-nourished. No distress.  Obese Body mass index is 31.54  kg/(m^2).   HENT:  Head: Normocephalic and atraumatic.  Right Ear: External ear normal.  Left Ear: External ear normal.  Mouth/Throat: Oropharynx is clear and moist. No oropharyngeal exudate.  Eyes: Conjunctivae and EOM are normal. Pupils are equal, round, and reactive to light. Right eye exhibits no discharge. Left eye exhibits no discharge. No scleral icterus.  Neck: Normal range of motion. Neck supple. No JVD present. No tracheal deviation present. No thyromegaly present.  Cardiovascular: Normal rate, regular rhythm, normal heart sounds and intact distal pulses.  Exam reveals no gallop and no friction rub.   No murmur heard. Pulmonary/Chest: Effort normal and breath sounds normal. No respiratory distress. She has no wheezes. She has no rales. She exhibits no tenderness.  Abdominal: Soft. Bowel sounds are normal. She exhibits no distension and no mass. There is no tenderness. There is no rebound and no guarding.  Musculoskeletal: Normal range of motion. She exhibits no edema and no tenderness.  Antalgic gait  Lymphadenopathy:    She has no cervical adenopathy.  Neurological: She is alert and oriented to person, place, and time. She has normal reflexes. No cranial nerve deficit. She exhibits normal muscle tone. Coordination normal.  Skin: Skin is warm and dry. No rash noted. She is not diaphoretic. No erythema. No pallor.  Psychiatric: She has a normal mood and affect. Her behavior is normal. Judgment and thought content normal.          Assessment & Plan:

## 2013-09-01 ENCOUNTER — Other Ambulatory Visit (HOSPITAL_BASED_OUTPATIENT_CLINIC_OR_DEPARTMENT_OTHER): Payer: Medicare HMO

## 2013-09-01 DIAGNOSIS — D6859 Other primary thrombophilia: Secondary | ICD-10-CM

## 2013-09-01 DIAGNOSIS — D6861 Antiphospholipid syndrome: Secondary | ICD-10-CM

## 2013-09-09 NOTE — Assessment & Plan Note (Signed)
#  COPD   - continue spiriva 1 puff daily  - use oxygen as needed  -try dymista for nasal drainage; could help shortness of breath - glad you had flu shot  #Followup 9 months or sooner if needed

## 2013-09-13 ENCOUNTER — Other Ambulatory Visit: Payer: Medicare HMO

## 2013-09-16 ENCOUNTER — Other Ambulatory Visit: Payer: Self-pay | Admitting: Cardiology

## 2013-10-11 ENCOUNTER — Other Ambulatory Visit (HOSPITAL_BASED_OUTPATIENT_CLINIC_OR_DEPARTMENT_OTHER): Payer: Medicare HMO

## 2013-10-11 DIAGNOSIS — D6861 Antiphospholipid syndrome: Secondary | ICD-10-CM

## 2013-10-11 DIAGNOSIS — D6859 Other primary thrombophilia: Secondary | ICD-10-CM

## 2013-10-11 LAB — CBC WITH DIFFERENTIAL/PLATELET
BASO%: 0.4 % (ref 0.0–2.0)
BASOS ABS: 0 10*3/uL (ref 0.0–0.1)
EOS%: 4.8 % (ref 0.0–7.0)
Eosinophils Absolute: 0.4 10*3/uL (ref 0.0–0.5)
HCT: 34.7 % — ABNORMAL LOW (ref 34.8–46.6)
HGB: 10.7 g/dL — ABNORMAL LOW (ref 11.6–15.9)
LYMPH#: 1.8 10*3/uL (ref 0.9–3.3)
LYMPH%: 21.6 % (ref 14.0–49.7)
MCH: 25.1 pg (ref 25.1–34.0)
MCHC: 30.8 g/dL — AB (ref 31.5–36.0)
MCV: 81.5 fL (ref 79.5–101.0)
MONO#: 0.7 10*3/uL (ref 0.1–0.9)
MONO%: 8.3 % (ref 0.0–14.0)
NEUT#: 5.3 10*3/uL (ref 1.5–6.5)
NEUT%: 64.9 % (ref 38.4–76.8)
Platelets: 207 10*3/uL (ref 145–400)
RBC: 4.26 10*6/uL (ref 3.70–5.45)
RDW: 15.8 % — AB (ref 11.2–14.5)
WBC: 8.2 10*3/uL (ref 3.9–10.3)

## 2013-10-11 LAB — PROTIME-INR
INR: 3.8 — AB (ref 2.00–3.50)
Protime: 45.6 Seconds — ABNORMAL HIGH (ref 10.6–13.4)

## 2013-10-15 ENCOUNTER — Other Ambulatory Visit: Payer: Self-pay | Admitting: Internal Medicine

## 2013-10-28 ENCOUNTER — Other Ambulatory Visit: Payer: Self-pay | Admitting: Rheumatology

## 2013-10-28 DIAGNOSIS — M545 Low back pain, unspecified: Secondary | ICD-10-CM

## 2013-10-28 DIAGNOSIS — M5410 Radiculopathy, site unspecified: Secondary | ICD-10-CM

## 2013-11-05 ENCOUNTER — Ambulatory Visit
Admission: RE | Admit: 2013-11-05 | Discharge: 2013-11-05 | Disposition: A | Discharge status: Home / Self Care | Payer: Commercial Managed Care - HMO | Source: Ambulatory Visit | Attending: Rheumatology | Admitting: Rheumatology

## 2013-11-05 DIAGNOSIS — M545 Low back pain, unspecified: Secondary | ICD-10-CM

## 2013-11-05 DIAGNOSIS — M5410 Radiculopathy, site unspecified: Secondary | ICD-10-CM

## 2013-11-08 ENCOUNTER — Other Ambulatory Visit: Payer: Medicare HMO

## 2013-11-09 ENCOUNTER — Other Ambulatory Visit: Payer: Self-pay | Admitting: Rheumatology

## 2013-11-09 DIAGNOSIS — IMO0002 Reserved for concepts with insufficient information to code with codable children: Secondary | ICD-10-CM

## 2013-11-09 DIAGNOSIS — M4800 Spinal stenosis, site unspecified: Secondary | ICD-10-CM

## 2013-11-10 ENCOUNTER — Telehealth: Payer: Self-pay | Admitting: *Deleted

## 2013-11-10 ENCOUNTER — Other Ambulatory Visit: Payer: Self-pay | Admitting: *Deleted

## 2013-11-10 DIAGNOSIS — D6861 Antiphospholipid syndrome: Secondary | ICD-10-CM

## 2013-11-10 NOTE — Telephone Encounter (Signed)
Pt called for a lab appt. gv appt for 11/15/13@ 10:15am due to she needed labs before her procedure...td

## 2013-11-10 NOTE — Telephone Encounter (Signed)
Patient calling for instructions on Coumadin so she can schedule a lumbar steroid injection for radiculopathy. Patient will be having procedure at [place]. She is to stop Coumadin x 3 days, then resume the following day after injection. She will then call us once she resumes her 5mg  daily to have PT/INR rechecked 7-10days. Left voicemail with [facility] scheduling to call us back also if they had any additional questions.

## 2013-11-15 ENCOUNTER — Ambulatory Visit
Admission: RE | Admit: 2013-11-15 | Discharge: 2013-11-15 | Disposition: A | Discharge status: Home / Self Care | Payer: Commercial Managed Care - HMO | Source: Ambulatory Visit | Attending: Rheumatology | Admitting: Rheumatology

## 2013-11-15 ENCOUNTER — Other Ambulatory Visit (HOSPITAL_BASED_OUTPATIENT_CLINIC_OR_DEPARTMENT_OTHER): Payer: Medicare HMO

## 2013-11-15 DIAGNOSIS — D6861 Antiphospholipid syndrome: Secondary | ICD-10-CM

## 2013-11-15 DIAGNOSIS — M4800 Spinal stenosis, site unspecified: Secondary | ICD-10-CM

## 2013-11-15 DIAGNOSIS — IMO0002 Reserved for concepts with insufficient information to code with codable children: Secondary | ICD-10-CM

## 2013-11-15 DIAGNOSIS — D6859 Other primary thrombophilia: Secondary | ICD-10-CM

## 2013-11-15 LAB — CBC WITH DIFFERENTIAL/PLATELET
BASO%: 0.5 % (ref 0.0–2.0)
Basophils Absolute: 0 10*3/uL (ref 0.0–0.1)
EOS%: 4.4 % (ref 0.0–7.0)
Eosinophils Absolute: 0.4 10*3/uL (ref 0.0–0.5)
HCT: 34.9 % (ref 34.8–46.6)
HGB: 10.8 g/dL — ABNORMAL LOW (ref 11.6–15.9)
LYMPH#: 1.6 10*3/uL (ref 0.9–3.3)
LYMPH%: 17.8 % (ref 14.0–49.7)
MCH: 25.1 pg (ref 25.1–34.0)
MCHC: 30.9 g/dL — AB (ref 31.5–36.0)
MCV: 81 fL (ref 79.5–101.0)
MONO#: 0.8 10*3/uL (ref 0.1–0.9)
MONO%: 8.9 % (ref 0.0–14.0)
NEUT#: 6.1 10*3/uL (ref 1.5–6.5)
NEUT%: 68.4 % (ref 38.4–76.8)
Platelets: 168 10*3/uL (ref 145–400)
RBC: 4.31 10*6/uL (ref 3.70–5.45)
RDW: 15.6 % — AB (ref 11.2–14.5)
WBC: 8.8 10*3/uL (ref 3.9–10.3)
nRBC: 0 % (ref 0–0)

## 2013-11-15 LAB — PROTIME-INR
INR: 1.1 — ABNORMAL LOW (ref 2.00–3.50)
PROTIME: 13.2 s (ref 10.6–13.4)

## 2013-11-15 MED ORDER — METHYLPREDNISOLONE ACETATE 40 MG/ML INJ SUSP (RADIOLOG
120.0000 mg | Freq: Once | INTRAMUSCULAR | Status: AC
  Administered 2013-11-15: 120 mg via EPIDURAL

## 2013-11-15 MED ORDER — IOHEXOL 180 MG/ML  SOLN
1.0000 mL | Freq: Once | INTRAMUSCULAR | Status: AC | PRN
  Administered 2013-11-15: 1 mL via EPIDURAL

## 2013-11-15 NOTE — Discharge Instructions (Signed)

## 2013-12-06 ENCOUNTER — Other Ambulatory Visit (HOSPITAL_BASED_OUTPATIENT_CLINIC_OR_DEPARTMENT_OTHER): Payer: Medicare HMO

## 2013-12-06 DIAGNOSIS — D6861 Antiphospholipid syndrome: Secondary | ICD-10-CM

## 2013-12-06 DIAGNOSIS — D6859 Other primary thrombophilia: Secondary | ICD-10-CM

## 2013-12-06 LAB — CBC WITH DIFFERENTIAL/PLATELET
BASO%: 0.4 % (ref 0.0–2.0)
BASOS ABS: 0 10*3/uL (ref 0.0–0.1)
EOS ABS: 0.3 10*3/uL (ref 0.0–0.5)
EOS%: 3.6 % (ref 0.0–7.0)
HCT: 34.6 % — ABNORMAL LOW (ref 34.8–46.6)
HEMOGLOBIN: 10.8 g/dL — AB (ref 11.6–15.9)
LYMPH%: 21.5 % (ref 14.0–49.7)
MCH: 25.7 pg (ref 25.1–34.0)
MCHC: 31.2 g/dL — ABNORMAL LOW (ref 31.5–36.0)
MCV: 82.4 fL (ref 79.5–101.0)
MONO#: 0.5 10*3/uL (ref 0.1–0.9)
MONO%: 6.8 % (ref 0.0–14.0)
NEUT%: 67.7 % (ref 38.4–76.8)
NEUTROS ABS: 4.7 10*3/uL (ref 1.5–6.5)
PLATELETS: 135 10*3/uL — AB (ref 145–400)
RBC: 4.2 10*6/uL (ref 3.70–5.45)
RDW: 16.5 % — ABNORMAL HIGH (ref 11.2–14.5)
WBC: 6.9 10*3/uL (ref 3.9–10.3)
lymph#: 1.5 10*3/uL (ref 0.9–3.3)
nRBC: 0 % (ref 0–0)

## 2013-12-06 LAB — PROTHROMBIN TIME
INR: 3.76 — AB (ref <1.50)
PROTHROMBIN TIME: 35.7 s — AB (ref 11.6–15.2)

## 2013-12-06 LAB — PROTIME-INR

## 2013-12-07 ENCOUNTER — Encounter: Payer: Self-pay | Admitting: Cardiology

## 2013-12-07 ENCOUNTER — Ambulatory Visit (INDEPENDENT_AMBULATORY_CARE_PROVIDER_SITE_OTHER): Payer: Medicare HMO | Admitting: Cardiology

## 2013-12-07 VITALS — BP 142/78 | HR 82 | Ht 63.0 in | Wt 171.0 lb

## 2013-12-07 DIAGNOSIS — D6859 Other primary thrombophilia: Secondary | ICD-10-CM

## 2013-12-07 DIAGNOSIS — R0609 Other forms of dyspnea: Secondary | ICD-10-CM

## 2013-12-07 DIAGNOSIS — D6861 Antiphospholipid syndrome: Secondary | ICD-10-CM

## 2013-12-07 DIAGNOSIS — R0989 Other specified symptoms and signs involving the circulatory and respiratory systems: Secondary | ICD-10-CM

## 2013-12-07 DIAGNOSIS — I10 Essential (primary) hypertension: Secondary | ICD-10-CM

## 2013-12-07 DIAGNOSIS — R0600 Dyspnea, unspecified: Secondary | ICD-10-CM

## 2013-12-07 DIAGNOSIS — E785 Hyperlipidemia, unspecified: Secondary | ICD-10-CM

## 2013-12-07 LAB — BASIC METABOLIC PANEL
BUN: 25 mg/dL — AB (ref 6–23)
CALCIUM: 8.8 mg/dL (ref 8.4–10.5)
CO2: 29 meq/L (ref 19–32)
CREATININE: 1.8 mg/dL — AB (ref 0.4–1.2)
Chloride: 105 mEq/L (ref 96–112)
GFR: 29.25 mL/min — ABNORMAL LOW (ref >60.00)
GLUCOSE: 113 mg/dL — AB (ref 70–99)
Potassium: 3.6 mEq/L (ref 3.5–5.1)
SODIUM: 141 meq/L (ref 135–145)

## 2013-12-07 LAB — LIPID PANEL
CHOLESTEROL: 157 mg/dL (ref 0–200)
HDL: 37.8 mg/dL — AB (ref >39.00)
LDL Cholesterol: 70 mg/dL (ref 0–99)
TRIGLYCERIDES: 244 mg/dL — AB (ref 0.0–149.0)
Total CHOL/HDL Ratio: 4
VLDL: 48.8 mg/dL — ABNORMAL HIGH (ref 0.0–40.0)

## 2013-12-07 NOTE — Patient Instructions (Signed)
Your physician recommends that you have a lipid profile /BMET.  Your physician wants you to follow-up in: 1 year with Dr Aundra Dubin. (March 2016).  You will receive a reminder letter in the mail two months in advance. If you don't receive a letter, please call our office to schedule the follow-up appointment.

## 2013-12-08 ENCOUNTER — Other Ambulatory Visit: Payer: Self-pay | Admitting: *Deleted

## 2013-12-08 DIAGNOSIS — I10 Essential (primary) hypertension: Secondary | ICD-10-CM

## 2013-12-08 NOTE — Progress Notes (Signed)
Patient ID: Karen Chambers, female   DOB: March 21, 1946, 68 y.o.   MRN: [id number] PCP: Roe Coombs  68 yo with history of HTN, hyperlipidemia, COPD, and antiphospholipid antibody syndrome returns for cardiology followup.  She continues to use oxygen at night and on occasion with exertion because of COPD.  She is not short of breath walking on flat ground though she does get short of breath climbing steps.  No chest pain.  BP is slightly elevated today but has been < 140/90 when she checks at home.  She is using a walker now because of low back pain from degenerative disc disease.  She has been getting steroid injections.   Labs (5/11): K 3.9, creatinine 1.2, HDL 68, LDL 79  Labs (9/11): K 3.1, creatinine 1.3  Labs (10/11): K 4.3, creatinine 1.2  Labs (11/12): BNP 164 Labs (3/14): K 3.5, creatinine 1.3, LDL 124 Labs (3/15): HCT 34.6  ECG: NSR, RBBB, LAFB  Allergies (verified):  1) ! Allopurinol  2) ! Macrobid  3) ! Keflex  4) ! Sulfa   Past Medical History:  1. Hyperlipidemia  2. HTN: renal artery ultrasound (10/11) with no renal artery stenosis. ACEI cough.  3. Hypothyroidism  4. History of UTIs  5. 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 Followed by Dr. Jana Hakim. On permanent warfarin.  Goal INR has been 2.5-3.5. 6. Obesity  7. Gout: questionable. Uric acid normal when checked in 10/11.  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 Echo (8/11): moderate focal basal septal hypertrophy, EF 55-60%, no LV outflow tract gradient, grade I diastolic dysfunction, no regional wall motion abnormalities, no systolic anteiror motion of the mitral valve, PA systolic pressure 30 mmHg.  10. Low back pain  11. Rheumatoid arthritis on MTX  12. COPD on home oxygen  Family History:  Mother with "enlarged heart," probably died of MI at age 43. Father passed away young from cancer.   Social History:  Worked in accounts payable department, now  retired. Prior smoker, quit 2000. Separated from husband. Has children.   Current Outpatient Prescriptions  Medication Sig Dispense Refill  . amLODipine (NORVASC) 5 MG tablet Take 1 tablet (5 mg total) by mouth daily.  90 tablet  3  . aspirin 81 MG tablet Take 81 mg by mouth daily.        Marland Kitchen atorvastatin (LIPITOR) 20 MG tablet TAKE 1 TABLET EVERY DAY  90 tablet  4  . febuxostat (ULORIC) 40 MG tablet Take 40 mg by mouth daily.        . hydrochlorothiazide (HYDRODIURIL) 50 MG tablet Take 1 tablet (50 mg total) by mouth daily.  90 tablet  3  . leflunomide (ARAVA) 20 MG tablet Take 20 mg by mouth daily.        Marland Kitchen levothyroxine (SYNTHROID, LEVOTHROID) 100 MCG tablet TAKE 1 TABLET DAILY  90 tablet  4  . losartan (COZAAR) 100 MG tablet TAKE 1 TABLET EVERY DAY  90 tablet  0  . metoprolol succinate (TOPROL-XL) 100 MG 24 hr tablet Take 1 tablet (100 mg total) by mouth 2 (two) times daily. Take with or immediately following a meal.  180 tablet  3  . Multiple Vitamin (MULTIVITAMIN) capsule Take 1 capsule by mouth daily.        . Omega-3 Fatty Acids (ULTRA OMEGA-3 FISH OIL) 1400 MG CAPS Take 1 tablet by mouth.        . potassium chloride SA (KLOR-CON M20) 20 MEQ tablet Take  1 tablet (20 mEq total) by mouth daily.  90 tablet  3  . SPIRIVA HANDIHALER 18 MCG inhalation capsule INHALE ONE CAPSULE ONCE DAILY  30 capsule  9  . traMADol (ULTRAM) 50 MG tablet Take 1 tablet (50 mg total) by mouth every 6 (six) hours as needed.  70 tablet  0  . warfarin (COUMADIN) 5 MG tablet Take 5 mg by mouth daily at 6 PM.       No current facility-administered medications for this visit.    BP 142/78  Pulse 82  Ht 5\' 3"  (1.6 m)  Wt 77.565 kg (171 lb)  BMI 30.30 kg/m2 General: NAD, obese Neck: No JVD, no thyromegaly or thyroid nodule.  Lungs: Clear to auscultation bilaterally with normal respiratory effort. CV: Nondisplaced PMI.  Heart regular S1/S2, no S3/S4, 2/6 early SEM RUSB.  No edema.  No carotid bruit.  Normal pedal  pulses.  Abdomen: Soft, nontender, no hepatosplenomegaly, no distention.  Neurologic: Alert and oriented x 3.  Psych: Normal affect. Extremities: No clubbing or cyanosis.   Assessment/Plan: 1. Antiphospholipid antibody syndrome: No thrombotic event in the last few years. She is on warfarin with goal INR 2.5-3.5 and also on ASA 81.  2. HTN: BP seems stable. Continue current regimen.    3. Hyperlipidemia: Check lipids.  She is on atorvastatin.  4. Dyspnea: Stable, suspect primarily due to COPD.  She continues to wear oxygen at night and with heavy exertion.  5. Murmur: Stable.  Based on last echo, may be due to turbulence across LV outflow tract though no significant gradient was noted.   Loralie Champagne 12/08/2013

## 2013-12-09 ENCOUNTER — Other Ambulatory Visit: Payer: Self-pay | Admitting: Rheumatology

## 2013-12-09 DIAGNOSIS — M5410 Radiculopathy, site unspecified: Secondary | ICD-10-CM

## 2013-12-09 DIAGNOSIS — M5104 Intervertebral disc disorders with myelopathy, thoracic region: Secondary | ICD-10-CM

## 2013-12-13 ENCOUNTER — Other Ambulatory Visit: Payer: Self-pay | Admitting: *Deleted

## 2013-12-13 DIAGNOSIS — D6861 Antiphospholipid syndrome: Secondary | ICD-10-CM

## 2013-12-13 NOTE — Progress Notes (Signed)
This RN spoke with pt per her call stating need for lab check prior to steroid injection next Tues 3/31.  This is the second in series and Manreet will stop coumadin for procedure and needs lab to obtain injection.  Appointment and orders entered per discussion.

## 2013-12-19 ENCOUNTER — Other Ambulatory Visit (INDEPENDENT_AMBULATORY_CARE_PROVIDER_SITE_OTHER): Payer: Medicare HMO

## 2013-12-19 DIAGNOSIS — I10 Essential (primary) hypertension: Secondary | ICD-10-CM

## 2013-12-19 LAB — BASIC METABOLIC PANEL
BUN: 21 mg/dL (ref 6–23)
CALCIUM: 9 mg/dL (ref 8.4–10.5)
CO2: 26 mEq/L (ref 19–32)
CREATININE: 1.4 mg/dL — AB (ref 0.4–1.2)
Chloride: 106 mEq/L (ref 96–112)
GFR: 38.95 mL/min — ABNORMAL LOW (ref >60.00)
Glucose, Bld: 125 mg/dL — ABNORMAL HIGH (ref 70–99)
Potassium: 3.9 mEq/L (ref 3.5–5.1)
Sodium: 139 mEq/L (ref 135–145)

## 2013-12-20 ENCOUNTER — Other Ambulatory Visit (HOSPITAL_BASED_OUTPATIENT_CLINIC_OR_DEPARTMENT_OTHER): Payer: Medicare HMO

## 2013-12-20 ENCOUNTER — Ambulatory Visit
Admission: RE | Admit: 2013-12-20 | Discharge: 2013-12-20 | Disposition: A | Discharge status: Home / Self Care | Payer: Commercial Managed Care - HMO | Source: Ambulatory Visit | Attending: Rheumatology | Admitting: Rheumatology

## 2013-12-20 DIAGNOSIS — M5410 Radiculopathy, site unspecified: Secondary | ICD-10-CM

## 2013-12-20 DIAGNOSIS — D6859 Other primary thrombophilia: Secondary | ICD-10-CM

## 2013-12-20 DIAGNOSIS — D6861 Antiphospholipid syndrome: Secondary | ICD-10-CM

## 2013-12-20 DIAGNOSIS — M5104 Intervertebral disc disorders with myelopathy, thoracic region: Secondary | ICD-10-CM

## 2013-12-20 LAB — CBC WITH DIFFERENTIAL/PLATELET
BASO%: 0.4 % (ref 0.0–2.0)
Basophils Absolute: 0 10*3/uL (ref 0.0–0.1)
EOS%: 2.5 % (ref 0.0–7.0)
Eosinophils Absolute: 0.2 10*3/uL (ref 0.0–0.5)
HCT: 35.6 % (ref 34.8–46.6)
HGB: 11.1 g/dL — ABNORMAL LOW (ref 11.6–15.9)
LYMPH%: 22 % (ref 14.0–49.7)
MCH: 25.6 pg (ref 25.1–34.0)
MCHC: 31.2 g/dL — AB (ref 31.5–36.0)
MCV: 82 fL (ref 79.5–101.0)
MONO#: 0.9 10*3/uL (ref 0.1–0.9)
MONO%: 12.7 % (ref 0.0–14.0)
NEUT#: 4.4 10*3/uL (ref 1.5–6.5)
NEUT%: 62.4 % (ref 38.4–76.8)
PLATELETS: 169 10*3/uL (ref 145–400)
RBC: 4.34 10*6/uL (ref 3.70–5.45)
RDW: 16.8 % — AB (ref 11.2–14.5)
WBC: 7.1 10*3/uL (ref 3.9–10.3)
lymph#: 1.6 10*3/uL (ref 0.9–3.3)

## 2013-12-20 LAB — PROTIME-INR
INR: 1.3 — ABNORMAL LOW (ref 2.00–3.50)
Protime: 15.6 Seconds — ABNORMAL HIGH (ref 10.6–13.4)

## 2013-12-20 MED ORDER — METHYLPREDNISOLONE ACETATE 40 MG/ML INJ SUSP (RADIOLOG
120.0000 mg | Freq: Once | INTRAMUSCULAR | Status: AC
  Administered 2013-12-20: 120 mg via EPIDURAL

## 2013-12-20 MED ORDER — IOHEXOL 180 MG/ML  SOLN
1.0000 mL | Freq: Once | INTRAMUSCULAR | Status: AC | PRN
  Administered 2013-12-20: 1 mL via EPIDURAL

## 2013-12-23 ENCOUNTER — Other Ambulatory Visit: Payer: Self-pay

## 2014-01-03 ENCOUNTER — Telehealth: Payer: Self-pay | Admitting: Oncology

## 2014-01-03 ENCOUNTER — Other Ambulatory Visit: Payer: Medicare HMO

## 2014-01-03 ENCOUNTER — Other Ambulatory Visit: Payer: Self-pay

## 2014-01-03 DIAGNOSIS — D6859 Other primary thrombophilia: Secondary | ICD-10-CM

## 2014-01-03 MED ORDER — ATORVASTATIN CALCIUM 20 MG PO TABS: ORAL_TABLET | ORAL | Status: DC

## 2014-01-03 MED ORDER — AMLODIPINE BESYLATE 10 MG PO TABS: 10.0000 mg | ORAL_TABLET | Freq: Every day | ORAL | Status: DC

## 2014-01-03 NOTE — Telephone Encounter (Signed)
pt called to r/s lab to thurs at 1pm...done....pt aware of new d.t

## 2014-01-05 ENCOUNTER — Other Ambulatory Visit: Payer: Medicare HMO

## 2014-01-05 ENCOUNTER — Telehealth: Payer: Self-pay | Admitting: Oncology

## 2014-01-05 NOTE — Telephone Encounter (Signed)
, °

## 2014-01-09 ENCOUNTER — Ambulatory Visit (INDEPENDENT_AMBULATORY_CARE_PROVIDER_SITE_OTHER): Payer: Medicare HMO | Admitting: Adult Health

## 2014-01-09 ENCOUNTER — Encounter: Payer: Self-pay | Admitting: Adult Health

## 2014-01-09 ENCOUNTER — Telehealth: Payer: Self-pay | Admitting: Critical Care Medicine

## 2014-01-09 ENCOUNTER — Ambulatory Visit (INDEPENDENT_AMBULATORY_CARE_PROVIDER_SITE_OTHER)
Admission: RE | Admit: 2014-01-09 | Discharge: 2014-01-09 | Disposition: A | Discharge status: Home / Self Care | Payer: Medicare HMO | Source: Ambulatory Visit | Attending: Adult Health | Admitting: Adult Health

## 2014-01-09 ENCOUNTER — Telehealth: Payer: Self-pay | Admitting: Internal Medicine

## 2014-01-09 VITALS — BP 132/84 | HR 97 | Temp 98.3°F | Ht 63.0 in | Wt 169.2 lb

## 2014-01-09 DIAGNOSIS — J441 Chronic obstructive pulmonary disease with (acute) exacerbation: Secondary | ICD-10-CM

## 2014-01-09 DIAGNOSIS — J449 Chronic obstructive pulmonary disease, unspecified: Secondary | ICD-10-CM

## 2014-01-09 MED ORDER — AMOXICILLIN-POT CLAVULANATE 875-125 MG PO TABS: 1.0000 | ORAL_TABLET | Freq: Two times a day (BID) | ORAL | Status: DC

## 2014-01-09 MED ORDER — PREDNISONE 10 MG PO TABS: ORAL_TABLET | ORAL | Status: DC

## 2014-01-09 NOTE — Progress Notes (Signed)
Subjective:    Patient ID: Karen Chambers, female    DOB: 1946-06-26, 68 y.o.   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  #Gold Stage 2 copd (followed by Dr. Annamaria Boots 2005-2006 and Dr. Chase Caller Jan 2012 - date). - MM genotype  - Baseline PFt Aug 2005 - fev1 1.4L/61%, DLCO 74%.   - Class 2 dyspnea with baseline desaturation to 86% walking 185 feet in feb 2012; uses innogen system  - PFTS today 03/03/2011 - FEv1 1.2L/57%, no bd respoinse. dLCO 14.5/60%. This is a 200cc drop in 7 yeears . Rx with spiriva (changed from symbicort)    OV 11/08/2012  - 9 months followup Gold stage II COPD. Last visit was May 2013. She is maintained stability of COPD since then. COPD cat score is down to 11. She is now retired and does enjoy her life. However the main problem is dyspnea that happens when she climbs a hill or flight of stairs. She does not use oxygen at this time. Of note, she is never tender pulmonary rehabilitation but now that she is retired she is open to doing so. She is up-to-date with vaccines  Past, Family, Social reviewed: no change since last visit  #COPD   - continue spiriva 1 puff daily  - use oxygen as needed  -  Attend pulmonary rehabilitation; I have referred you to the program at [place]   #Followup 9 months or sooner if needed Flu shot in the fall   OV 08/29/2013  Chief Complaint  Patient presents with  . Follow-up    for COPD. Pt denies any changes in SOB, no better, no worse.     Followup COPD moderate disease  Overall COPD stable since the last 9 months I saw her. No history of flareups. She is compliant with Spiriva but is having trouble of ferning due to donut hole.  COPD cat score is only 8 and as detailed below. Uptodate with flu shot.  RA walk 185 feet x did not desaturate; stopped due to back ache  Past medical history: Main issue currently is that she's got back ache with critical up if he and his got a see Dr. Nelva Bush.   CAT COPD Symptom and Quality of Life Score (glax[employer] smith kline trademark)  02/10/2012  11/08/2012  08/29/2013   Never Cough -> Cough all the time 2 2 0  No phlegm in chest -> Chest is full of phlegm 2 1 1   No chest tightness -> Chest feels very tight 0 0 0  No dyspnea for 1 flight stairs/hill -> Very dyspneic for 1 flight of stairs 4 5 3   No limitations for ADL at home -> Very limited with ADL at home 3 1 2   Confident leaving home -> Not at all confident leaving home 0 0 0  Sleep soundly -> Do not sleep soundly because of lung condition 2 0 0  Lots of Energy -> No energy at all 2 2 2   TOTAL Score (max 40)  15 11 8      01/09/2014 Acute OV  Complains of increased SOB, prod cough with clear mucus, wheezing, chest tightness, PND x4days.  Denies f/c/s,  hemoptysis, nausea, vomiting.   Pt was 79% on 2L pulsed upon entering exam room. On 3l/m pulse 90% . We discussed turning her oxygen up to 3 L pulse seen with walking. In order to keep oxygen saturations above 90% No recent abx, or travel.  Patient denies any hemoptysis, orthopnea, PND, leg swelling, abdominal pain, nausea, vomiting.      Review of Systems  Constitutional:   No  weight loss, night sweats,  Fevers, chills, fatigue, or  lassitude.  HEENT:   No headaches,  Difficulty swallowing,  Tooth/dental problems, or  Sore throat,                No sneezing, itching, ear ache, nasal congestion, post nasal drip,   CV:  No chest pain,  Orthopnea, PND, swelling in lower extremities, anasarca, dizziness, palpitations, syncope.   GI  No heartburn, indigestion, abdominal pain, nausea, vomiting, diarrhea, change in bowel habits, loss of appetite, bloody stools.   Resp:    No chest wall  deformity  Skin: no rash or lesions.  GU: no dysuria, change in color of urine, no urgency or frequency.  No flank pain, no hematuria   MS:  No joint pain or swelling.  No decreased range of motion.  No back pain.  Psych:  No change in mood or affect. No depression or anxiety.  No memory loss.          Objective:   Physical Exam  GEN: A/Ox3; pleasant , NAD, elderly , chronically ill appearing   HEENT:  [place]/AT,  EACs-clear, TMs-wnl, NOSE-clear, THROAT-clear, no lesions, no postnasal drip or exudate noted.   NECK:  Supple w/ fair ROM; no JVD; normal carotid impulses w/o bruits; no thyromegaly or nodules palpated; no lymphadenopathy.  RESP  Few rhonchi and exp wheezes .no accessory muscle use, no dullness to percussion  CARD:  RRR, no m/r/g  , no peripheral edema, pulses intact, no cyanosis or clubbing.  GI:   Soft & nt; nml bowel sounds; no organomegaly or masses detected.  Musco: Warm bil, no deformities or joint swelling noted.   Neuro: alert, no focal deficits noted.    Skin: Warm, no lesions or rashes         Assessment & Plan:

## 2014-01-09 NOTE — Telephone Encounter (Signed)
Called and spoke with and appt scheduled to come in and see TP this afternoon. Nothing further needed

## 2014-01-09 NOTE — Assessment & Plan Note (Signed)
Flare  Check cxr   Plan  Augmentin 875mg  Twice daily  For 7 days w/ food.  Mucinex DM Twice daily  As needed  Cough/congestion  Prednisone taper over next week.  Please contact office for sooner follow up if symptoms do not improve or worsen or seek emergency care  Follow up Dr. Chase Caller as planned and As needed   Notify coumadin clinic that you are beginning antibiotic. Increase Oxygen 3l/m pulse with walking.

## 2014-01-09 NOTE — Telephone Encounter (Signed)
No meds sent to pharmacy    I called in augmentin/pred pulse

## 2014-01-09 NOTE — Patient Instructions (Addendum)
Augmentin 875mg  Twice daily  For 7 days w/ food.  Mucinex DM Twice daily  As needed  Cough/congestion  Prednisone taper over next week.  Please contact office for sooner follow up if symptoms do not improve or worsen or seek emergency care  Follow up Dr. Chase Caller as planned and As needed   Notify coumadin clinic that you are beginning antibiotic. Increase Oxygen 3l/m pulse with walking.

## 2014-01-10 ENCOUNTER — Telehealth: Payer: Self-pay | Admitting: Adult Health

## 2014-01-10 MED ORDER — PREDNISONE 10 MG PO TABS: ORAL_TABLET | ORAL | Status: DC

## 2014-01-10 NOTE — Telephone Encounter (Signed)
Wants slow pred taper rx to [facility]

## 2014-01-10 NOTE — Progress Notes (Signed)
Quick Note:  Called spoke with patient, advised of cxr results / recs as stated by TP. Pt verbalized her understanding and denied any questions. ______ 

## 2014-01-11 ENCOUNTER — Telehealth: Payer: Self-pay | Admitting: Oncology

## 2014-01-11 ENCOUNTER — Other Ambulatory Visit: Payer: Medicare HMO

## 2014-01-11 NOTE — Telephone Encounter (Signed)
, °

## 2014-01-19 ENCOUNTER — Other Ambulatory Visit: Payer: Self-pay

## 2014-01-19 MED ORDER — METOPROLOL SUCCINATE ER 100 MG PO TB24: 100.0000 mg | ORAL_TABLET | Freq: Two times a day (BID) | ORAL | Status: DC

## 2014-01-23 ENCOUNTER — Other Ambulatory Visit: Payer: Self-pay

## 2014-01-23 MED ORDER — METOPROLOL SUCCINATE ER 100 MG PO TB24: 100.0000 mg | ORAL_TABLET | Freq: Two times a day (BID) | ORAL | Status: DC

## 2014-01-25 ENCOUNTER — Other Ambulatory Visit: Payer: Self-pay | Admitting: Oncology

## 2014-01-26 ENCOUNTER — Other Ambulatory Visit: Payer: Self-pay

## 2014-01-26 MED ORDER — LOSARTAN POTASSIUM 100 MG PO TABS: ORAL_TABLET | ORAL | Status: DC

## 2014-01-31 ENCOUNTER — Other Ambulatory Visit: Payer: Medicare HMO

## 2014-01-31 ENCOUNTER — Telehealth: Payer: Self-pay | Admitting: Oncology

## 2014-01-31 NOTE — Telephone Encounter (Signed)
pt cld wanting to chge appt to Thurs/resch gave pt new time & date

## 2014-02-02 ENCOUNTER — Other Ambulatory Visit (HOSPITAL_BASED_OUTPATIENT_CLINIC_OR_DEPARTMENT_OTHER): Payer: Medicare HMO

## 2014-02-02 ENCOUNTER — Other Ambulatory Visit: Payer: Self-pay | Admitting: Cardiology

## 2014-02-02 DIAGNOSIS — D6859 Other primary thrombophilia: Secondary | ICD-10-CM

## 2014-02-02 DIAGNOSIS — D6861 Antiphospholipid syndrome: Secondary | ICD-10-CM

## 2014-02-02 LAB — CBC WITH DIFFERENTIAL/PLATELET
BASO%: 0.6 % (ref 0.0–2.0)
Basophils Absolute: 0 10*3/uL (ref 0.0–0.1)
EOS%: 4 % (ref 0.0–7.0)
Eosinophils Absolute: 0.3 10*3/uL (ref 0.0–0.5)
HCT: 32 % — ABNORMAL LOW (ref 34.8–46.6)
HGB: 10.2 g/dL — ABNORMAL LOW (ref 11.6–15.9)
LYMPH%: 14.6 % (ref 14.0–49.7)
MCH: 26.4 pg (ref 25.1–34.0)
MCHC: 31.7 g/dL (ref 31.5–36.0)
MCV: 83.1 fL (ref 79.5–101.0)
MONO#: 0.6 10*3/uL (ref 0.1–0.9)
MONO%: 9.1 % (ref 0.0–14.0)
NEUT#: 4.8 10*3/uL (ref 1.5–6.5)
NEUT%: 71.7 % (ref 38.4–76.8)
Platelets: 241 10*3/uL (ref 145–400)
RBC: 3.85 10*6/uL (ref 3.70–5.45)
RDW: 19 % — AB (ref 11.2–14.5)
WBC: 6.7 10*3/uL (ref 3.9–10.3)
lymph#: 1 10*3/uL (ref 0.9–3.3)

## 2014-02-02 LAB — PROTHROMBIN TIME
INR: 4.91 — AB (ref <1.50)
PROTHROMBIN TIME: 43.8 s — AB (ref 11.6–15.2)

## 2014-02-02 LAB — PROTIME-INR

## 2014-02-06 ENCOUNTER — Telehealth: Payer: Self-pay | Admitting: *Deleted

## 2014-02-06 DIAGNOSIS — D6859 Other primary thrombophilia: Secondary | ICD-10-CM

## 2014-02-06 NOTE — Telephone Encounter (Signed)
Returning call to patient regarding concern of labs from 5/14. PT/INR was sent out, so results were not back to call to patient. Patient did hold one does of Coumadin, and left message to skip today also. We will recheck this on 5/19 with MD visit. Left detailed message and instructed to call back with any questions.

## 2014-02-07 ENCOUNTER — Other Ambulatory Visit: Payer: Self-pay | Admitting: Physician Assistant

## 2014-02-07 ENCOUNTER — Telehealth: Payer: Self-pay | Admitting: Pharmacist

## 2014-02-07 ENCOUNTER — Ambulatory Visit (HOSPITAL_BASED_OUTPATIENT_CLINIC_OR_DEPARTMENT_OTHER): Payer: Medicare HMO | Admitting: Oncology

## 2014-02-07 ENCOUNTER — Other Ambulatory Visit: Payer: Medicare HMO

## 2014-02-07 ENCOUNTER — Other Ambulatory Visit (HOSPITAL_BASED_OUTPATIENT_CLINIC_OR_DEPARTMENT_OTHER): Payer: Medicare HMO

## 2014-02-07 ENCOUNTER — Telehealth: Payer: Self-pay | Admitting: Oncology

## 2014-02-07 VITALS — BP 164/88 | HR 89 | Temp 98.7°F | Resp 18 | Ht 63.0 in | Wt 171.1 lb

## 2014-02-07 DIAGNOSIS — J441 Chronic obstructive pulmonary disease with (acute) exacerbation: Secondary | ICD-10-CM

## 2014-02-07 DIAGNOSIS — I10 Essential (primary) hypertension: Secondary | ICD-10-CM

## 2014-02-07 DIAGNOSIS — D6861 Antiphospholipid syndrome: Secondary | ICD-10-CM

## 2014-02-07 DIAGNOSIS — M549 Dorsalgia, unspecified: Secondary | ICD-10-CM

## 2014-02-07 DIAGNOSIS — J449 Chronic obstructive pulmonary disease, unspecified: Secondary | ICD-10-CM

## 2014-02-07 DIAGNOSIS — D689 Coagulation defect, unspecified: Secondary | ICD-10-CM | POA: Insufficient documentation

## 2014-02-07 DIAGNOSIS — Z7901 Long term (current) use of anticoagulants: Secondary | ICD-10-CM

## 2014-02-07 DIAGNOSIS — D6859 Other primary thrombophilia: Secondary | ICD-10-CM

## 2014-02-07 DIAGNOSIS — R894 Abnormal immunological findings in specimens from other organs, systems and tissues: Secondary | ICD-10-CM

## 2014-02-07 LAB — PROTIME-INR
INR: 1.1 — ABNORMAL LOW (ref 2.00–3.50)
Protime: 13.2 Seconds (ref 10.6–13.4)

## 2014-02-07 NOTE — Progress Notes (Signed)
ID: Karen Chambers   DOB: 07-13-1946  MR#: [id number]  CSN#:[id number]  PCP: Aura Dials, PA-C GYN: SU: OTHER MD: Hurley Cisco, Jerilee Hoh Ramaswami  CC: "By back pain isn't any better"  INTERVAL HISTORY: Karen Chambers returns today for followup of her coagulopathy. The interval history is significant for her to having gone off her warfarin so she could receive some epidural shots for her low back pain. Unfortunately this did not help. Dr. Charlestine Night has suggested she meet with Dr. Arnoldo Morale in neurosurgery to discuss the possibility of surgery or possibly other interventions. That appointment is scheduled for 03/14/2014.  REVIEW OF SYSTEMS: Karen Chambers denies any bleeding or clotting problems since her last visit with me. The big problem of course is the back pain. Her hearing aid is not working well, and her dentures don't fit well. She sleeps on 2 pillows. She has had no fevers, rash, or unexplained weight loss or fatigue. She is now using a "sleep walker". A detailed review of systems today was otherwise stable  PAST MEDICAL HISTORY: Past Medical History  Diagnosis Date  . Other and unspecified hyperlipidemia 401.9  . Hypothyroidism   . Obesity, unspecified   . Gout   . Low back pain   . Rheumatoid arthritis(714.0)   . Cough 11/12/2010  . Hypertension     PAST SURGICAL HISTORY: Past Surgical History  Procedure Laterality Date  . Cholecystectomy    . Total abdominal hysterectomy      FAMILY HISTORY Family History  Problem Relation Age of Onset  . Cancer Father   . Heart disease Mother     HEALTH MAINTENANCE: History  Substance Use Topics  . Smoking status: Former Smoker -- 1.50 packs/day for 36 years    Types: Cigarettes    Quit date: 07/05/1999  . Smokeless tobacco: Never Used     Comment: 1ppd x 36 years  . Alcohol Use: No     Colonoscopy: due 2014; refuses  PAP: n/a  Mammogram: March 2014/[facility]  Lipid panel:  Allergies  Allergen Reactions  .  Allopurinol Itching, Nausea Only, Rash and Other (See Comments)    Top of head to tip of toes with rash; aches all over  . Cephalexin Other (See Comments)    Severe case of thrush in mouth/tongue.  Tongue raw and hard and swollen.  . Nitrofurantoin Hives, Itching, Nausea And Vomiting, Rash and Other (See Comments)    Body aches, like I have the flu  . Ace Inhibitors Other (See Comments)    cough  . Sulfonamide Derivatives Hives, Itching and Rash    Current Outpatient Prescriptions  Medication Sig Dispense Refill  . amLODipine (NORVASC) 10 MG tablet Take 1 tablet (10 mg total) by mouth daily.  90 tablet  2  . amoxicillin-clavulanate (AUGMENTIN) 875-125 MG per tablet Take 1 tablet by mouth 2 (two) times daily.  20 tablet  0  . aspirin 81 MG tablet Take 81 mg by mouth daily.        Marland Kitchen atorvastatin (LIPITOR) 20 MG tablet TAKE 1 TABLET EVERY DAY  90 tablet  4  . febuxostat (ULORIC) 40 MG tablet Take 40 mg by mouth daily.        Marland Kitchen leflunomide (ARAVA) 20 MG tablet Take 20 mg by mouth daily.        Marland Kitchen levothyroxine (SYNTHROID, LEVOTHROID) 100 MCG tablet TAKE 1 TABLET DAILY  90 tablet  3  . losartan (COZAAR) 100 MG tablet TAKE 1 TABLET EVERY DAY  30 tablet  0  . metoprolol succinate (TOPROL-XL) 100 MG 24 hr tablet Take 1 tablet (100 mg total) by mouth 2 (two) times daily. Take with or immediately following a meal.  180 tablet  3  . Multiple Vitamin (MULTIVITAMIN) capsule Take 1 capsule by mouth daily.        . Omega-3 Fatty Acids (ULTRA OMEGA-3 FISH OIL) 1400 MG CAPS Take 1 tablet by mouth.        . potassium chloride SA (KLOR-CON M20) 20 MEQ tablet Take 1 tablet (20 mEq total) by mouth daily.  90 tablet  3  . predniSONE (DELTASONE) 10 MG tablet Take 4 tablets daily for 5 days then stop  20 tablet  0  . predniSONE (DELTASONE) 10 MG tablet 4 tabs for 2 days, then 3 tabs for 2 days, 2 tabs for 2 days, then 1 tab for 2 days, then stop  20 tablet  0  . SPIRIVA HANDIHALER 18 MCG inhalation capsule INHALE  ONE CAPSULE ONCE DAILY  30 capsule  9  . traMADol (ULTRAM) 50 MG tablet Take 1 tablet (50 mg total) by mouth every 6 (six) hours as needed.  70 tablet  0  . warfarin (COUMADIN) 5 MG tablet Take 5 mg by mouth daily at 6 PM.       No current facility-administered medications for this visit.    OBJECTIVE: Middle-aged white woman who appears stated age  13 Vitals:   02/07/14 1337  BP: 164/88  Pulse: 89  Temp: 98.7 F (37.1 C)  Resp: 18     Body mass index is 30.32 kg/(m^2).    ECOG FS: 2  Sclerae unicteric, EOMs intact Oropharynx clear and moist No cervical or supraclavicular adenopathy Lungs no rales or rhonchi Heart regular rate and rhythm, II/ IV SEM, unchanged Abd obese, soft, nontender, positive bowel sounds MSK kyphosis and scoliosis but no focal spinal tenderness Neuro: nonfocal, well oriented, friendly affect   LAB RESULTS: Lab Results  Component Value Date   WBC 6.7 02/02/2014   NEUTROABS 4.8 02/02/2014   HGB 10.2* 02/02/2014   HCT 32.0* 02/02/2014   MCV 83.1 02/02/2014   PLT 241 02/02/2014      Chemistry      Component Value Date/Time   NA 139 12/19/2013 1400   K 3.9 12/19/2013 1400   CL 106 12/19/2013 1400   CO2 26 12/19/2013 1400   BUN 21 12/19/2013 1400   CREATININE 1.4* 12/19/2013 1400      Component Value Date/Time   CALCIUM 9.0 12/19/2013 1400       No results found for this basename: LABCA2    No components found with this basename: [id number]     Recent Labs Lab 02/07/14 1302  INR 1.10*    Urinalysis No results found for this basename: colorurine,  appearanceur,  labspec,  phurine,  glucoseu,  hgbur,  bilirubinur,  ketonesur,  proteinur,  urobilinogen,  nitrite,  leukocytesur    STUDIES: Dg Chest 2 View  01/09/2014   CLINICAL DATA:  Shortness of breath, cough, wheezing. Prior smoker. COPD.  EXAM: CHEST  2 VIEW  COMPARISON:  11/12/2010  FINDINGS: There is hyperinflation of the lungs compatible with COPD. Heart is upper limits normal in size.  No confluent opacities or effusions. No acute bony abnormality.  IMPRESSION: COPD.  No active disease.   Electronically Signed   By: Rolm Baptise M.D.   On: 01/09/2014 17:26     ASSESSMENT: 68 y.o.  [place] woman with a history of  antiphospholipid syndrome with a weakly positive lupus anticoagulant, a persistent elevation of the PTT and elevated anticardiolipin antibodies, with a history of thalamic infarct October 2007, on lifelong Coumadin; other problems including rheumatoid arthritis, gout, hypertension, obesity, and COPD.     PLAN:  Karen Chambers is doing fine from an anticoagulation point of view, and the plan is to continue warfarin indefinitely, trying to keep it at an INR between 2 and 3.5. I think she would benefit from our recently organized Coumadin clinic and I am putting the referral in for her today.  In the meantime should go back to warfarin at 5 mg daily. She will not be seeing the neurosurgeon until late June and does not anticipate surgery even at that time.  Karen Chambers has a good understanding of the overall plan. She agrees with it. She knows a goal of treatment in her case is control. She will return to see [place] in 1 year. We'll continue to monitor her INR monthly, or more frequently if necessary. She knows to call for any problems that may develop before her next visit here, which will be in one year  Chauncey Cruel    02/07/2014

## 2014-02-07 NOTE — Telephone Encounter (Signed)
lvm fo rpt regarding to todays appt... °

## 2014-02-07 NOTE — Telephone Encounter (Signed)
New coumadin clinic patient request received from Dr. Jana Hakim. Pt is a long term coumadin patient and has been on coumadin for years. It appears patient is on 7.5 mg three days a week and 5 mg the other days. Her INR was elevated 2 days ago at > 4 and was held x 2 days. INR today on 02/07/14 was 1.1. Called patient this afternoon to discuss dosing and set up INR check next week around 02/15/14. Unable to reach patient. Left VM for patient to call back to discuss and set up appointments for next week.   Thank you,  Montel Clock, PharmD

## 2014-02-08 ENCOUNTER — Telehealth: Payer: Self-pay | Admitting: Pharmacist

## 2014-02-08 NOTE — Telephone Encounter (Signed)
Received call today from Ms. Picinich. She has been enrolled in the coumadin clinic. She will return for INR check on 02/15/14 at 1:45pm and be seen by a pharmacist in the coumadin clinic at 2pm. Ms. Paulick states she is now taking coumadin 5 mg daily and resumed her coumadin on 02/07/14 after holding x 2 days for elevated INR.   Thank you, Montel Clock, PharmD

## 2014-02-09 ENCOUNTER — Telehealth: Payer: Self-pay | Admitting: Oncology

## 2014-02-09 NOTE — Telephone Encounter (Signed)
lmonvm advising the pt of her appts on 02/15/2014 and for her to pick up the reat of her appts for this year and 2016.

## 2014-02-14 ENCOUNTER — Ambulatory Visit: Payer: Medicare HMO | Admitting: Oncology

## 2014-02-15 ENCOUNTER — Other Ambulatory Visit (HOSPITAL_BASED_OUTPATIENT_CLINIC_OR_DEPARTMENT_OTHER): Payer: Medicare HMO

## 2014-02-15 ENCOUNTER — Ambulatory Visit (HOSPITAL_BASED_OUTPATIENT_CLINIC_OR_DEPARTMENT_OTHER): Payer: Medicare HMO | Admitting: Pharmacist

## 2014-02-15 DIAGNOSIS — D689 Coagulation defect, unspecified: Secondary | ICD-10-CM

## 2014-02-15 LAB — POCT INR: INR: 2.8

## 2014-02-15 LAB — PROTIME-INR
INR: 2.8 (ref 2.00–3.50)
Protime: 33.6 Seconds — ABNORMAL HIGH (ref 10.6–13.4)

## 2014-02-15 NOTE — Patient Instructions (Signed)
Patient will continue taking her 5 mg daily.  RTC on 03/01/14 at 2:00 for lab and 2:15 for coumadin clinic.Patient will continue taking her 5 mg daily

## 2014-02-15 NOTE — Progress Notes (Signed)
Pt seen in our clinic for first time today She has been on coumadin for a "long time" and it has been managed by Dr. Marjie Skiff She has been stable on 5mg  daily until her last visit was high and needed to be sent our for confirmation INR=2.8 today on 5mg  daily She has not had any medicaion changes  No s/s of bleeding or clotting Diet consistent  Patient will continue taking her 5 mg daily.   RTC on 03/01/14 at 2:00 for lab and 2:15 for coumadin clinic.

## 2014-03-01 ENCOUNTER — Other Ambulatory Visit (HOSPITAL_BASED_OUTPATIENT_CLINIC_OR_DEPARTMENT_OTHER): Payer: Medicare HMO

## 2014-03-01 ENCOUNTER — Ambulatory Visit (HOSPITAL_BASED_OUTPATIENT_CLINIC_OR_DEPARTMENT_OTHER): Payer: Medicare HMO | Admitting: Pharmacist

## 2014-03-01 ENCOUNTER — Other Ambulatory Visit: Payer: Commercial Managed Care - HMO

## 2014-03-01 DIAGNOSIS — D6859 Other primary thrombophilia: Secondary | ICD-10-CM

## 2014-03-01 DIAGNOSIS — D689 Coagulation defect, unspecified: Secondary | ICD-10-CM

## 2014-03-01 LAB — PROTIME-INR
INR: 4.3 — ABNORMAL HIGH (ref 2.00–3.50)
Protime: 51.6 Seconds — ABNORMAL HIGH (ref 10.6–13.4)

## 2014-03-01 LAB — POCT INR: INR: 4.3

## 2014-03-01 NOTE — Progress Notes (Signed)
INR above goal today. No problems or concerns regarding anticoagulation. No s/s of clotting. No changes in diet. Pt took Prednisone 5mg  x 1 dose on 02/27/14 for gout flare. She usually takes Prednisone 5mg  daily x 2 days for her gout flares. No missed for extra coumadin doses. Pt will be seen later this month on 6/23 by Dr. Arnoldo Morale for her ongoing bulging disk. Hold coumadin today (6/10) and tomorrow (6/11).   On 6/12, resume coumadin 5mg  daily.  Recheck INR in 2 weeks with scheduled lab on 03/15/14; lab at 1:45pm and coumadin clinic at 2:00pm.  Will evaluate INR on 6/24 and if elevated pt may need coumadin dose reduction.

## 2014-03-01 NOTE — Patient Instructions (Addendum)
Hold coumadin today (6/10) and tomorrow (6/11).   On 6/12, resume coumadin 5mg  daily. Recheck INR in 2 weeks with scheduled lab on 03/15/14; lab at 1:45pm and coumadin clinic at 2:00pm.

## 2014-03-01 NOTE — Addendum Note (Signed)
Addended by: Neysa Hotter on: 03/01/2014 02:37 PM   Modules accepted: Orders

## 2014-03-10 ENCOUNTER — Other Ambulatory Visit: Payer: Self-pay

## 2014-03-10 MED ORDER — POTASSIUM CHLORIDE CRYS ER 20 MEQ PO TBCR: 20.0000 meq | EXTENDED_RELEASE_TABLET | Freq: Every day | ORAL | Status: DC

## 2014-03-10 MED ORDER — LOSARTAN POTASSIUM 100 MG PO TABS: ORAL_TABLET | ORAL | Status: DC

## 2014-03-15 ENCOUNTER — Other Ambulatory Visit (HOSPITAL_BASED_OUTPATIENT_CLINIC_OR_DEPARTMENT_OTHER): Payer: Medicare HMO

## 2014-03-15 ENCOUNTER — Ambulatory Visit (HOSPITAL_BASED_OUTPATIENT_CLINIC_OR_DEPARTMENT_OTHER): Payer: Medicare HMO | Admitting: Pharmacist

## 2014-03-15 DIAGNOSIS — D689 Coagulation defect, unspecified: Secondary | ICD-10-CM

## 2014-03-15 DIAGNOSIS — D6859 Other primary thrombophilia: Secondary | ICD-10-CM

## 2014-03-15 LAB — POCT INR: INR: 4

## 2014-03-15 LAB — PROTIME-INR
INR: 4 — ABNORMAL HIGH (ref 2.00–3.50)
Protime: 48 Seconds — ABNORMAL HIGH (ref 10.6–13.4)

## 2014-03-15 NOTE — Progress Notes (Signed)
INR = 4.0     Goal 2-3.5 INR above goal range today. No complications of anticoagulation noted. No medication changes. Patient states she has been eating a little less due to the hot weather. She is considering having back surgery in the fall, she saw a neurosurgeon yesterday but has not yet decided what to do. The surgeon has assured her that her Coumadin can be managed around surgery should she decide to go forward with the procedure. She will eat a salad today. She will take Coumadin 2.5 mg today, then decrease her dose to Coumadin 5 mg daily except 2.5 mg Wednesdays and Saturdays. She will return in 2 weeks on 03/29/14 for lab at 2:00pm and Coumadin Clinic at 2:15.  Theone Murdoch, PharmD

## 2014-03-29 ENCOUNTER — Ambulatory Visit (HOSPITAL_BASED_OUTPATIENT_CLINIC_OR_DEPARTMENT_OTHER): Payer: Medicare HMO | Admitting: Pharmacist

## 2014-03-29 ENCOUNTER — Other Ambulatory Visit (HOSPITAL_BASED_OUTPATIENT_CLINIC_OR_DEPARTMENT_OTHER): Payer: Medicare HMO

## 2014-03-29 DIAGNOSIS — D6859 Other primary thrombophilia: Secondary | ICD-10-CM

## 2014-03-29 DIAGNOSIS — D689 Coagulation defect, unspecified: Secondary | ICD-10-CM

## 2014-03-29 LAB — PROTIME-INR
INR: 3.1 (ref 2.00–3.50)
Protime: 37.2 Seconds — ABNORMAL HIGH (ref 10.6–13.4)

## 2014-03-29 LAB — POCT INR: INR: 3.1

## 2014-03-29 NOTE — Progress Notes (Signed)
INR = 3.1 on Coumadin 5 mg daily except 2.5 mg on Wed/Sat No complaints re: anticoag. No complications to report. She has no med changes since last seen in Coumadin clinic. INR at goal.  We'll have her continue same dose and see her in 2 weeks (already scheduled).  If INR therapeutic then, we can see her in 1 month (monthly labs already scheduled). Kennith Center, Pharm.D., CPP 03/29/2014@2 :31 PM

## 2014-04-12 ENCOUNTER — Ambulatory Visit (HOSPITAL_BASED_OUTPATIENT_CLINIC_OR_DEPARTMENT_OTHER): Payer: Commercial Managed Care - HMO | Admitting: Pharmacist

## 2014-04-12 ENCOUNTER — Other Ambulatory Visit (HOSPITAL_BASED_OUTPATIENT_CLINIC_OR_DEPARTMENT_OTHER): Payer: Commercial Managed Care - HMO

## 2014-04-12 ENCOUNTER — Telehealth: Payer: Self-pay | Admitting: Oncology

## 2014-04-12 DIAGNOSIS — D689 Coagulation defect, unspecified: Secondary | ICD-10-CM

## 2014-04-12 DIAGNOSIS — D6861 Antiphospholipid syndrome: Secondary | ICD-10-CM

## 2014-04-12 LAB — PROTIME-INR
INR: 3.1 (ref 2.00–3.50)
Protime: 37.2 Seconds — ABNORMAL HIGH (ref 10.6–13.4)

## 2014-04-12 LAB — POCT INR: INR: 3.1

## 2014-04-12 MED ORDER — WARFARIN SODIUM 5 MG PO TABS: ORAL_TABLET | ORAL | Status: DC

## 2014-04-12 NOTE — Telephone Encounter (Signed)
per Gerald Stabs sch CC appt-sch-pt aware

## 2014-04-12 NOTE — Progress Notes (Signed)
INR at goal Pt is doing well today with no complaints No missed or extra doses No unusual bleeding or bruising No diet or medication changes Pt appears to be stable on current dose now, will schedule visit out to 1 month Plan: No changes Continue Coumadin 5mg  daily except 2.5 mg Wednesdays and Saturdays.  Recheck INR in 4 weeks with scheduled lab on 05/17/14; lab at 1:45 pm and Coumadin clinic at 2 pm.

## 2014-04-12 NOTE — Patient Instructions (Signed)
INR at goal No changes Continue Coumadin 5mg  daily except 2.5 mg Wednesdays and Saturdays.  Recheck INR in 4 weeks with scheduled lab on 05/17/14; lab at 1:45 pm and Coumadin clinic at 2 pm.

## 2014-05-17 ENCOUNTER — Other Ambulatory Visit (HOSPITAL_BASED_OUTPATIENT_CLINIC_OR_DEPARTMENT_OTHER): Payer: Medicare HMO

## 2014-05-17 ENCOUNTER — Ambulatory Visit (HOSPITAL_BASED_OUTPATIENT_CLINIC_OR_DEPARTMENT_OTHER): Payer: Medicare HMO | Admitting: Pharmacist

## 2014-05-17 DIAGNOSIS — D689 Coagulation defect, unspecified: Secondary | ICD-10-CM

## 2014-05-17 DIAGNOSIS — D6859 Other primary thrombophilia: Secondary | ICD-10-CM

## 2014-05-17 LAB — PROTIME-INR
INR: 3.8 — ABNORMAL HIGH (ref 2.00–3.50)
Protime: 45.6 Seconds — ABNORMAL HIGH (ref 10.6–13.4)

## 2014-05-17 LAB — POCT INR: INR: 3.8

## 2014-05-17 NOTE — Progress Notes (Signed)
INR above goal today. INR goal = 2-3.5. No missed or extra coumadin doses. No changes in medications. Pt has been eating more tomatoes lately. She thinks because she is eating more tomatoes that she has been eating less of her regular diet. She has one, small, quarter-size bruise on her left, inner thigh. Nothing concerning. It is healing. No bleeding. No s/s of clotting noted. No problems or concerns regarding anticoagulation. Her last 2 INR levels have been stable at 3.1. Will not change dose today. Hold coumadin today (05/17/14).   On 05/18/14, continue Coumadin 5mg  daily except 2.5mg  Wednesdays and Saturdays.  Recheck INR in 2 weeks on 05/30/14; lab at 1:45 pm and Coumadin clinic at 2 pm.  Will evaluate INR and adjust as necessary. CBC ordered for review with next visit.

## 2014-05-17 NOTE — Patient Instructions (Addendum)
Hold coumadin today (05/17/14).   On 05/18/14, continue Coumadin 5mg  daily except 2.5mg  Wednesdays and Saturdays.  Recheck INR in 2 weeks on 05/30/14; lab at 1:45 pm and Coumadin clinic at 2 pm.

## 2014-05-18 ENCOUNTER — Telehealth: Payer: Self-pay | Admitting: Oncology

## 2014-05-18 NOTE — Telephone Encounter (Signed)
per Aaron Edelman CC to sch CC appt-pt aware

## 2014-05-30 ENCOUNTER — Other Ambulatory Visit (HOSPITAL_BASED_OUTPATIENT_CLINIC_OR_DEPARTMENT_OTHER): Payer: Commercial Managed Care - HMO

## 2014-05-30 ENCOUNTER — Ambulatory Visit (HOSPITAL_BASED_OUTPATIENT_CLINIC_OR_DEPARTMENT_OTHER): Payer: Medicare HMO

## 2014-05-30 DIAGNOSIS — D689 Coagulation defect, unspecified: Secondary | ICD-10-CM | POA: Diagnosis not present

## 2014-05-30 LAB — CBC WITH DIFFERENTIAL/PLATELET
BASO%: 0.6 % (ref 0.0–2.0)
Basophils Absolute: 0.1 10*3/uL (ref 0.0–0.1)
EOS ABS: 0.4 10*3/uL (ref 0.0–0.5)
EOS%: 4.4 % (ref 0.0–7.0)
HCT: 31.1 % — ABNORMAL LOW (ref 34.8–46.6)
HGB: 9.4 g/dL — ABNORMAL LOW (ref 11.6–15.9)
LYMPH%: 20.6 % (ref 14.0–49.7)
MCH: 23.8 pg — ABNORMAL LOW (ref 25.1–34.0)
MCHC: 30.2 g/dL — ABNORMAL LOW (ref 31.5–36.0)
MCV: 78.7 fL — ABNORMAL LOW (ref 79.5–101.0)
MONO#: 0.8 10*3/uL (ref 0.1–0.9)
MONO%: 8.8 % (ref 0.0–14.0)
NEUT%: 65.6 % (ref 38.4–76.8)
NEUTROS ABS: 5.8 10*3/uL (ref 1.5–6.5)
Platelets: 215 10*3/uL (ref 145–400)
RBC: 3.95 10*6/uL (ref 3.70–5.45)
RDW: 16.4 % — AB (ref 11.2–14.5)
WBC: 8.8 10*3/uL (ref 3.9–10.3)
lymph#: 1.8 10*3/uL (ref 0.9–3.3)

## 2014-05-30 LAB — PROTIME-INR
INR: 3.1 (ref 2.00–3.50)
Protime: 37.2 Seconds — ABNORMAL HIGH (ref 10.6–13.4)

## 2014-05-30 LAB — POCT INR: INR: 3.1

## 2014-05-30 NOTE — Progress Notes (Signed)
INR 3.1 today is within goal range of 2-3.5 on Coumadin 5 mg daily except 2.5 on Wednesday/Saturday. Karen Chambers denies any bleeding or unusual bruising, and states her previous bruise she had mentioned at her last visit is healing nicely. She denies any missed or extra doses, and has no medication changes other than she has not taken her leflunamide for ~1.5 weeks as she has been unable to obtain this medication. She states her rheumatologist has not added anything in its place. She also mentioned her rheumatologist was going to be ordering upcoming labs due to a low hemoglobin at her last visit. (Hemoglobin 9.4 today) Karen Chambers states she has had no diet changes, and denies any issues with her Coumadin.   Plan: Will continue current coumadin dose of 5 mg by mouth daily except 2.5 mg on Wednesdays and Saturdays. We will see her again in 4 weeks on 06/28/14 (lab at 1:45 pm and Coumadin Clinic at 2 pm)  Lyons Switch Pharmacist PGY2 Hematology/Oncology Pharmacy Resident

## 2014-06-01 ENCOUNTER — Encounter: Payer: Self-pay | Admitting: Internal Medicine

## 2014-06-01 ENCOUNTER — Ambulatory Visit (INDEPENDENT_AMBULATORY_CARE_PROVIDER_SITE_OTHER): Payer: Medicare HMO | Admitting: Internal Medicine

## 2014-06-01 VITALS — BP 130/72 | HR 85 | Ht 63.0 in | Wt 168.0 lb

## 2014-06-01 DIAGNOSIS — J449 Chronic obstructive pulmonary disease, unspecified: Secondary | ICD-10-CM

## 2014-06-01 DIAGNOSIS — Z23 Encounter for immunization: Secondary | ICD-10-CM

## 2014-06-01 NOTE — Patient Instructions (Addendum)
#  COPD - stable   - continue spiriva 1 puff daily ; try respimat format - if you like it call us and will do script with prior authorixation  - use oxygen as needed - have PREVNAR 06/01/2014 - have flu shot through [facility] asap  #Followup 6 months or sooner if needed Return sooner of call if in exacerbation

## 2014-06-01 NOTE — Progress Notes (Signed)
Subjective:    Patient ID: Karen Chambers, female    DOB: 17-May-1946, 68 y.o.   MRN: [id number] PCP SPENCER,SARA C, PA-C  HPI   #obese female,  #ex-25 pack smoker,  #hx of macrodantin HP v COP NOS  # And in VW disease, APLA syndrome - thalamic infarct Oct 2010 on chronic coumadin (dr Jana Hakim),   #RA (chronic prednisone since summer 2011 - stopped sometime in 2014, intolerant to few weeks of mtx summer 2011), gout, # ACE inhibitor cough - resolved Feb 2012.  #Gold Stage 2 copd (followed by Dr. Annamaria Boots 2005-2006 and Dr. Chase Caller Jan 2012 - date). - MM genotype  - Baseline PFt Aug 2005 - fev1 1.4L/61%, DLCO 74%.   - Class 2 dyspnea with baseline desaturation to 86% walking 185 feet in feb 2012; uses innogen system  - PFTS today 03/03/2011 - FEv1 1.2L/57%, no bd respoinse. dLCO 14.5/60%. This is a 200cc drop in 7 yeears . Rx with spiriva (changed from symbicort)    OV 11/08/2012  - 9 months followup Gold stage II COPD. Last visit was May 2013. She is maintained stability of COPD since then. COPD cat score is down to 11. She is now retired and does enjoy her life. However the main problem is dyspnea that happens when she climbs a hill or flight of stairs. She does not use oxygen at this time. Of note, she is never tender pulmonary rehabilitation but now that she is retired she is open to doing so. She is up-to-date with vaccines  Past, Family, Social reviewed: no change since last visit  #COPD   - continue spiriva 1 puff daily  - use oxygen as needed  -  Attend pulmonary rehabilitation; I have referred you to the program at    #Followup 9 months or sooner if needed Flu shot in the fall   OV 08/29/2013  Chief Complaint  Patient presents with  . Follow-up    for COPD. Pt denies any changes in SOB, no better, no worse.     Followup COPD moderate disease  Overall COPD stable since the last 9 months I saw her. No history of flareups. She is compliant with Spiriva  but is having trouble of ferning due to donut hole. COPD cat score is only 8 and as detailed below. Uptodate with flu shot.  RA walk 185 feet x did not desaturate; stopped due to back ache  Past medical history: Main issue currently is that she's got back ache with critical up if he and his got a see Dr. Nelva Bush.       01/09/2014 Acute OV  Complains of increased SOB, prod cough with clear mucus, wheezing, chest tightness, PND x4days.  Denies f/c/s, hemoptysis, nausea, vomiting.   Pt was 79% on 2L pulsed upon entering exam room. On 3l/m pulse 90% . We discussed turning her oxygen up to 3 L pulse seen with walking. In order to keep oxygen saturations above 90% No recent abx, or travel.  Patient denies any hemoptysis, orthopnea, PND, leg swelling, abdominal pain, nausea, vomiting.  Rx Augmentin, prednisone  OV 06/01/2014  Chief Complaint  Patient presents with  . Follow-up    Pt last saw TP on 4/20 for an acute visit. Pt states her breathing is doing well. Pt denies SOB, cough and CP/tightness.    Followup moderate COPD   - I personally saw her in December 2014 but in April 2015 she saw my nurse practitioner with an exacerbation and  was treated with Augmentin and prednisone. She says she recovered well from this episode. Currently she is stable without any problems only mild shortness of breath and cough. She is compliant with Spiriva but complains of the lower cost associated with this. She is asking for samples. She wants to have the Prevnar vaccine today but will have a flu shot with her insurance company.  Of note, the baseline eosinophils a high and we did discuss AZN COPD trial but she will probably qualify only she has another exacerbation by April 2016  Past medical history reviewed: She does not take chronic prednisone anymore for her gout and rheumatoid arthritis   CAT COPD Symptom and Quality of Life Score ([employer])  02/10/2012  11/08/2012  08/29/2013     Never Cough -> Cough all the time 2 2 0  No phlegm in chest -> Chest is full of phlegm 2 1 1   No chest tightness -> Chest feels very tight 0 0 0  No dyspnea for 1 flight stairs/hill -> Very dyspneic for 1 flight of stairs 4 5 3   No limitations for ADL at home -> Very limited with ADL at home 3 1 2   Confident leaving home -> Not at all confident leaving home 0 0 0  Sleep soundly -> Do not sleep soundly because of lung condition 2 0 0  Lots of Energy -> No energy at all 2 2 2   TOTAL Score (max 40)  15 11 8     Review of Systems  Constitutional: Negative for fever and unexpected weight change.  HENT: Negative for congestion, dental problem, ear pain, nosebleeds, postnasal drip, rhinorrhea, sinus pressure, sneezing, sore throat and trouble swallowing.   Eyes: Negative for redness and itching.  Respiratory: Negative for cough, chest tightness, shortness of breath and wheezing.   Cardiovascular: Negative for palpitations and leg swelling.  Gastrointestinal: Negative for nausea and vomiting.  Genitourinary: Negative for dysuria.  Musculoskeletal: Negative for joint swelling.  Skin: Negative for rash.  Neurological: Negative for headaches.  Hematological: Does not bruise/bleed easily.  Psychiatric/Behavioral: Negative for dysphoric mood. The patient is not nervous/anxious.       Current outpatient prescriptions:amLODipine (NORVASC) 10 MG tablet, Take 1 tablet (10 mg total) by mouth daily., Disp: 90 tablet, Rfl: 2;  aspirin 81 MG tablet, Take 81 mg by mouth daily.  , Disp: , Rfl: ;  atorvastatin (LIPITOR) 20 MG tablet, TAKE 1 TABLET EVERY DAY, Disp: 90 tablet, Rfl: 4;  febuxostat (ULORIC) 40 MG tablet, Take 40 mg by mouth daily.  , Disp: , Rfl:  levothyroxine (SYNTHROID, LEVOTHROID) 100 MCG tablet, TAKE 1 TABLET DAILY, Disp: 90 tablet, Rfl: 3;  losartan (COZAAR) 100 MG tablet, TAKE 1 TABLET EVERY DAY, Disp: 90 tablet, Rfl: 3;  metoprolol succinate (TOPROL-XL) 100 MG 24 hr tablet, Take 1 tablet  (100 mg total) by mouth 2 (two) times daily. Take with or immediately following a meal., Disp: 180 tablet, Rfl: 3 Multiple Vitamin (MULTIVITAMIN) capsule, Take 1 capsule by mouth daily.  , Disp: , Rfl: ;  Omega 3 1200 MG CAPS, Take 1 capsule by mouth daily., Disp: , Rfl: ;  potassium chloride SA (KLOR-CON M20) 20 MEQ tablet, Take 1 tablet (20 mEq total) by mouth daily., Disp: 90 tablet, Rfl: 3;  SPIRIVA HANDIHALER 18 MCG inhalation capsule, INHALE ONE CAPSULE ONCE DAILY, Disp: 30 capsule, Rfl: 9 traMADol (ULTRAM) 50 MG tablet, Take 100 mg by mouth every 6 (six) hours as needed., Disp: , Rfl: ;  warfarin (COUMADIN) 5 MG tablet, Take 5 mg daily or as instructed by the coumadin clinic, Disp: 105 tablet, Rfl: 3  Objective:   Physical Exam  Filed Vitals:   06/01/14 1454  BP: 130/72  Pulse: 85  Height: 5\' 3"  (1.6 m)  Weight: 168 lb (76.204 kg)  SpO2: 95%    GEN: A/Ox3; pleasant , NAD, elderly , chronically ill appearing   HEENT:  [place]/AT,  EACs-clear, TMs-wnl, NOSE-clear, THROAT-clear, no lesions, no postnasal drip or exudate noted.   NECK:  Supple w/ fair ROM; no JVD; normal carotid impulses w/o bruits; no thyromegaly or nodules palpated; no lymphadenopathy.  RESP  Few rhonchi and exp wheezes .no accessory muscle use, no dullness to percussion  CARD:  RRR, no m/r/g  , no peripheral edema, pulses intact, no cyanosis or clubbing.  GI:   Soft & nt; nml bowel sounds; no organomegaly or masses detected.  Musco: Warm bil, no deformities or joint swelling noted.   Neuro: alert, no focal deficits noted.    Skin: Warm, no lesions or rashes         Assessment & Plan:  #COPD - stable   - continue spiriva 1 puff daily ; try respimat format - if you like it call us and will do script with prior authorixation  - use oxygen as needed - have PREVNAR 06/01/2014 - have flu shot through [facility] asap  #Followup 6 months or sooner if needed Return sooner of call if in exacerbation

## 2014-06-06 ENCOUNTER — Encounter: Payer: Self-pay | Admitting: *Deleted

## 2014-06-07 ENCOUNTER — Ambulatory Visit (INDEPENDENT_AMBULATORY_CARE_PROVIDER_SITE_OTHER): Payer: Medicare HMO | Admitting: Internal Medicine

## 2014-06-07 ENCOUNTER — Encounter: Payer: Self-pay | Admitting: Internal Medicine

## 2014-06-07 ENCOUNTER — Other Ambulatory Visit (INDEPENDENT_AMBULATORY_CARE_PROVIDER_SITE_OTHER): Payer: Medicare HMO

## 2014-06-07 ENCOUNTER — Telehealth: Payer: Self-pay | Admitting: *Deleted

## 2014-06-07 ENCOUNTER — Encounter: Payer: Self-pay | Admitting: Oncology

## 2014-06-07 VITALS — BP 138/76 | HR 68 | Ht 63.0 in | Wt 165.4 lb

## 2014-06-07 DIAGNOSIS — D509 Iron deficiency anemia, unspecified: Secondary | ICD-10-CM

## 2014-06-07 DIAGNOSIS — Z7901 Long term (current) use of anticoagulants: Secondary | ICD-10-CM

## 2014-06-07 DIAGNOSIS — J449 Chronic obstructive pulmonary disease, unspecified: Secondary | ICD-10-CM

## 2014-06-07 NOTE — Telephone Encounter (Signed)
06/07/2014  RE: Karen Chambers DOB: 31-Jul-1946 MRN: [id number]  Dear Dr Jana Hakim,   We have scheduled the above patient for an endoscopy and colonoscopy procedure. Our records show that she is on anticoagulation therapy.  Please advise as to whether the patient may hold her therapy of warfarin 5 days prior to the procedure, which is scheduled for 06/23/14. If the patient needs to be bridged with Lovenox, we ask that you arrange this. Please route your response to Dixon Boos, CMA.   Sincerely,  Dixon Boos

## 2014-06-07 NOTE — Patient Instructions (Addendum)
You have been scheduled for an endoscopy and colonoscopy. Please follow the written instructions given to you at your visit today. Please pick up your prep at the pharmacy within the next 1-3 days. If you use inhalers (even only as needed), please bring them with you on the day of your procedure. Your physician has requested that you go to www.startemmi.com and enter the access code given to you at your visit today. This web site gives a general overview about your procedure. However, you should still follow specific instructions given to you by our office regarding your preparation for the procedure.  Your physician has requested that you go to the basement for the following lab work before leaving today: IBC, Ferritin, IgA, TtG  CC:Dr Frederico Hamman, Dr Jana Hakim

## 2014-06-07 NOTE — Progress Notes (Signed)
Patient ID: Karen Chambers, female   DOB: December 23, 1945, 68 y.o.   MRN: [id number] HPI: Karen Chambers is a 68 yo female with PMH of RA followed by Dr. Charlestine Night, antiphospholipid syndrome with weekly positive lupus anticoagulant and persistently elevated PTT followed by Dr. Jana Hakim, COPD followed by Dr. Chase Caller, CAD followed by Dr. Aundra Dubin, and followed from primary care by Joni Reining, PA-C who is seen to evaluate microcytic anemia in consultation at the request of Dr. Charlestine Night. He is here alone today. She reports she really wasn't aware of her anemia but was told recently that she needed to consider GI examination. She reports she is feeling well overall. She denies increased fatigue. She has lost 10 pounds which she says is intentional by cutting portions. She denies abdominal pain. No nausea or vomiting. No trouble swallowing. No early satiety. No change in bowel habit including no diarrhea or constipation. No blood in her stool or melena. She does report a long history of external hemorrhoids which flares very occasionally. She occasionally, though rarely, sees bright red blood with wiping. She does have a significant family history of colorectal cancer in her dad and sister. This occurred around age 18 and 45, respectively. She has never had a colonoscopy. She has attempted colonoscopy on 2 occasions but could not tolerate the oral prep due to vomiting. She does wear nocturnal oxygen.  [place] woman with a history of antiphospholipid syndrome with a weakly positive lupus anticoagulant, a persistent elevation of the PTT and elevated anticardiolipin antibodies, with a history of thalamic infarct October 2007, on lifelong Coumadin; other problems including rheumatoid arthritis, gout, hypertension, obesity, and COPD.    Past Medical History  Diagnosis Date  . Other and unspecified hyperlipidemia 401.9  . Hypothyroidism   . Obesity, unspecified   . Gout   . Low back pain   . Rheumatoid  arthritis(714.0)   . Cough 11/12/2010  . Hypertension   . Coagulopathy   . COPD (chronic obstructive pulmonary disease)     Past Surgical History  Procedure Laterality Date  . Cholecystectomy    . Total abdominal hysterectomy      Outpatient Prescriptions Prior to Visit  Medication Sig Dispense Refill  . amLODipine (NORVASC) 10 MG tablet Take 1 tablet (10 mg total) by mouth daily.  90 tablet  2  . aspirin 81 MG tablet Take 81 mg by mouth daily.        Marland Kitchen atorvastatin (LIPITOR) 20 MG tablet TAKE 1 TABLET EVERY DAY  90 tablet  4  . febuxostat (ULORIC) 40 MG tablet Take 40 mg by mouth daily.        Marland Kitchen levothyroxine (SYNTHROID, LEVOTHROID) 100 MCG tablet TAKE 1 TABLET DAILY  90 tablet  3  . losartan (COZAAR) 100 MG tablet TAKE 1 TABLET EVERY DAY  90 tablet  3  . metoprolol succinate (TOPROL-XL) 100 MG 24 hr tablet Take 1 tablet (100 mg total) by mouth 2 (two) times daily. Take with or immediately following a meal.  180 tablet  3  . Multiple Vitamin (MULTIVITAMIN) capsule Take 1 capsule by mouth daily.        . Omega 3 1200 MG CAPS Take 1 capsule by mouth daily.      . potassium chloride SA (KLOR-CON M20) 20 MEQ tablet Take 1 tablet (20 mEq total) by mouth daily.  90 tablet  3  . SPIRIVA HANDIHALER 18 MCG inhalation capsule INHALE ONE CAPSULE ONCE DAILY  30 capsule  9  .  traMADol (ULTRAM) 50 MG tablet Take 100 mg by mouth every 6 (six) hours as needed.      . warfarin (COUMADIN) 5 MG tablet Take 5 mg daily or as instructed by the coumadin clinic  105 tablet  3   No facility-administered medications prior to visit.    Allergies  Allergen Reactions  . Allopurinol Itching, Nausea Only, Rash and Other (See Comments)    Top of head to tip of toes with rash; aches all over  . Cephalexin Other (See Comments)    Severe case of thrush in mouth/tongue.  Tongue raw and hard and swollen.  . Nitrofurantoin Hives, Itching, Nausea And Vomiting, Rash and Other (See Comments)    Body aches, like I have  the flu  . Ace Inhibitors Other (See Comments)    cough  . Sulfonamide Derivatives Hives, Itching and Rash    Family History  Problem Relation Age of Onset  . Cancer Father   . Heart disease Mother     History  Substance Use Topics  . Smoking status: Former Smoker -- 1.50 packs/day for 36 years    Types: Cigarettes    Quit date: 07/05/1999  . Smokeless tobacco: Never Used     Comment: 1ppd x 36 years  . Alcohol Use: No    ROS: As per history of present illness, otherwise negative  BP 138/76  Pulse 68  Ht 5\' 3"  (1.6 m)  Wt 165 lb 6.4 oz (75.025 kg)  BMI 29.31 kg/m2 Constitutional: Well-developed and well-nourished. No distress. HEENT: Normocephalic and atraumatic. Oropharynx is clear and moist. No oropharyngeal exudate. Conjunctivae are normal.  No scleral icterus. Neck: Neck supple. Trachea midline. Cardiovascular: Normal rate, regular rhythm and intact distal pulses. 2/6 sem Pulmonary/chest: Effort normal and breath sounds normal. No wheezing, rales or rhonchi. Abdominal: Soft, nontender, nondistended. Bowel sounds active throughout.  Extremities: no clubbing, cyanosis, or edema Lymphadenopathy: No cervical adenopathy noted. Neurological: Alert and oriented to person place and time. Skin: Skin is warm and dry. No rashes noted. Psychiatric: Normal mood and affect. Behavior is normal.  RELEVANT LABS AND IMAGING: CBC    Component Value Date/Time   WBC 8.8 05/30/2014 1334   RBC 3.95 05/30/2014 1334   HGB 9.4* 05/30/2014 1334   HCT 31.1* 05/30/2014 1334   PLT 215 05/30/2014 1334   MCV 78.7* 05/30/2014 1334   MCH 23.8* 05/30/2014 1334   MCH 29.4 10/08/2010 1216   MCHC 30.2* 05/30/2014 1334   RDW 16.4* 05/30/2014 1334   LYMPHSABS 1.8 05/30/2014 1334   MONOABS 0.8 05/30/2014 1334   EOSABS 0.4 05/30/2014 1334   BASOSABS 0.1 05/30/2014 1334    CMP     Component Value Date/Time   NA 139 12/19/2013 1400   K 3.9 12/19/2013 1400   CL 106 12/19/2013 1400   CO2 26 12/19/2013 1400   GLUCOSE  125* 12/19/2013 1400   BUN 21 12/19/2013 1400   CREATININE 1.4* 12/19/2013 1400   CALCIUM 9.0 12/19/2013 1400   GFRNONAA 41.70* 09/02/2010 1144    ASSESSMENT/PLAN:  68 yo female with PMH of RA followed by Dr. Charlestine Night, antiphospholipid syndrome with weekly positive lupus anticoagulant and persistently elevated PTT followed by Dr. Jana Hakim, COPD followed by Dr. Chase Caller, CAD followed by Dr. Aundra Dubin, and followed from primary care by Joni Reining, PA-C who is seen to evaluate microcytic anemia in consultation at the request of Dr. Charlestine Night.  1.  Microcytic anemia/FH of colon cancer -- I have recommended upper endoscopy and  colonoscopy to better evaluate her microcytic anemia and also given her family history of colorectal cancer. She has never had prior colorectal cancer screening. Her anemia is somewhat new over the past year but has been persistent for several months. There has been no visible bleeding. She does take warfarin and we need to obtain permission from Dr. Jana Hakim to hold anticoagulation. She may need Lovenox bridge but will defer this decision to him.  I will check iron studies today and celiac panel.  2. COPD on nighttime oxygen -- stable, recently seen by pulmonary. Her about procedures need to be performed in a hospital setting with monitored anesthesia care.  3. Coagulopathy -- see above regarding anticoagulation management for procedures

## 2014-06-07 NOTE — Addendum Note (Signed)
Addended by: Larina Bras on: 06/07/2014 05:04 PM   Modules accepted: Orders

## 2014-06-08 ENCOUNTER — Telehealth: Payer: Self-pay | Admitting: Oncology

## 2014-06-08 ENCOUNTER — Telehealth: Payer: Self-pay | Admitting: Internal Medicine

## 2014-06-08 ENCOUNTER — Other Ambulatory Visit: Payer: Self-pay | Admitting: Oncology

## 2014-06-08 ENCOUNTER — Encounter: Payer: Self-pay | Admitting: Pharmacist

## 2014-06-08 DIAGNOSIS — D689 Coagulation defect, unspecified: Secondary | ICD-10-CM

## 2014-06-08 DIAGNOSIS — Z23 Encounter for immunization: Secondary | ICD-10-CM | POA: Insufficient documentation

## 2014-06-08 LAB — FERRITIN: Ferritin: 59.6 ng/mL (ref 10.0–291.0)

## 2014-06-08 LAB — TISSUE TRANSGLUTAMINASE, IGA: Tissue Transglutaminase Ab, IgA: 5.2 U/mL (ref <20)

## 2014-06-08 LAB — IBC PANEL
Iron: 17 ug/dL — ABNORMAL LOW (ref 42–145)
Saturation Ratios: 7.2 % — ABNORMAL LOW (ref 20.0–50.0)
Transferrin: 168.4 mg/dL — ABNORMAL LOW (ref 212.0–360.0)

## 2014-06-08 LAB — IGA: IGA: 152 mg/dL (ref 68–378)

## 2014-06-08 MED ORDER — METOCLOPRAMIDE HCL 10 MG PO TABS: 10.0000 mg | ORAL_TABLET | ORAL | Status: DC

## 2014-06-08 NOTE — Telephone Encounter (Signed)
ATC pt fast busy signal x 3 wcb

## 2014-06-08 NOTE — Addendum Note (Signed)
Addended by: Larina Bras on: 06/08/2014 08:20 AM   Modules accepted: Orders

## 2014-06-08 NOTE — Assessment & Plan Note (Signed)
#  COPD - stable   - continue spiriva 1 puff daily ; try respimat format - if you like it call us and will do script with prior authorixation  - use oxygen as needed - have PREVNAR 06/01/2014 - have flu shot through [facility] asap  #Followup 6 months or sooner if needed Return sooner of call if in exacerbation

## 2014-06-08 NOTE — Progress Notes (Signed)
Per Discussion with Dr. Jana Hakim: Karen Chambers will have a procedure OCT 2. Her anticoagulation plan will be as follows:  1-stop Coumadin 9/28 (takes last dose that day) 2-have INR checked here OCT 1,5 and 7 3-resume Coumadin OCT 3 4-receive lovenox 1.5 mg/kg injection on OCT 3 and OCT 5  Patient contacted with this information and scheduling aware.  Thank you!  Montel Clock, PharmD, BCOP

## 2014-06-08 NOTE — Telephone Encounter (Signed)
Spoke with the pt  She states that her [employer] 1 concentrator is not working  She states that she called [employer] and the sooner they can get her a replacement is Tues 06/13/14  She uses her o2 as needed, mainly with sleep  I called [employer] and was not able to get any where  The rep that I spoke with told me that they could not pull her up in the system without the serial number on her machine  Called the lesion for [facility] at [phone] and [facility]   I spoke with the pt again and she then told me that she did not get her machine from [place], it came from [place]  She used to get o2 from [place] and asks if they can rent her some o2  I called and spoke with Arbie Cookey and she advised that she will with help with this and the pt can come and pick up the o2  Pt aware and address and phone number for [facility] provided Nothing further needed

## 2014-06-08 NOTE — Telephone Encounter (Signed)
per pof to sch pt appt-sch and pt will get updated copy on next visit

## 2014-06-09 ENCOUNTER — Other Ambulatory Visit: Payer: Self-pay

## 2014-06-09 MED ORDER — INTEGRA 62.5-62.5-40-3 MG PO CAPS: 1.0000 | ORAL_CAPSULE | Freq: Every day | ORAL | Status: DC

## 2014-06-12 ENCOUNTER — Encounter (HOSPITAL_COMMUNITY): Payer: Self-pay | Admitting: Pharmacy Technician

## 2014-06-14 ENCOUNTER — Other Ambulatory Visit: Payer: Medicare HMO

## 2014-06-14 ENCOUNTER — Encounter: Payer: Self-pay | Admitting: Internal Medicine

## 2014-06-14 ENCOUNTER — Telehealth: Payer: Self-pay | Admitting: *Deleted

## 2014-06-14 ENCOUNTER — Telehealth: Payer: Self-pay

## 2014-06-14 NOTE — Telephone Encounter (Signed)
per pof to sch pt lab * CC-pt aware

## 2014-06-14 NOTE — Telephone Encounter (Signed)
Error

## 2014-06-14 NOTE — Telephone Encounter (Signed)
I have left a message for Dr Magrinat's nurse asking that she please remind him that we need a response regarding patient's warfarin (it appears that the original encounter was closed but never answered). I will also resend note to Dr Jana Hakim since it appears it was closed by accident.

## 2014-06-14 NOTE — Telephone Encounter (Signed)
Dr Magrinat's nurse called back. Acutally, Dr Karen Chambers did respond to our request regarding anticoagulation. See 06/08/14 documentation note. Patient was already advised of instructions.  "Karen Chambers, RPH at 06/08/2014 8:46 AM     Status: Signed        Per Discussion with Dr. Jana Chambers: Karen Chambers will have a procedure OCT 2. Her anticoagulation plan will be as follows:  1-stop Coumadin 9/28 (takes last dose that day)  2-have INR checked here OCT 1,5 and 7  3-resume Coumadin OCT 3  4-receive lovenox 1.5 mg/kg injection on OCT 3 and OCT 5  Patient contacted with this information and scheduling aware.   Thank you!  Karen Chambers, PharmD, BCOP"

## 2014-06-15 ENCOUNTER — Encounter (HOSPITAL_COMMUNITY): Payer: Self-pay | Admitting: *Deleted

## 2014-06-16 ENCOUNTER — Other Ambulatory Visit: Payer: Self-pay

## 2014-06-19 ENCOUNTER — Telehealth: Payer: Self-pay | Admitting: Internal Medicine

## 2014-06-19 MED ORDER — MOVIPREP 100 G PO SOLR: 1.0000 | Freq: Once | ORAL | Status: DC

## 2014-06-19 NOTE — Telephone Encounter (Signed)
Rx sent. Patient advised. 

## 2014-06-22 ENCOUNTER — Other Ambulatory Visit (HOSPITAL_BASED_OUTPATIENT_CLINIC_OR_DEPARTMENT_OTHER): Payer: Medicare HMO

## 2014-06-22 ENCOUNTER — Other Ambulatory Visit: Payer: Self-pay | Admitting: Oncology

## 2014-06-22 DIAGNOSIS — D689 Coagulation defect, unspecified: Secondary | ICD-10-CM

## 2014-06-22 LAB — CBC WITH DIFFERENTIAL/PLATELET
BASO%: 0.7 % (ref 0.0–2.0)
BASOS ABS: 0.1 10*3/uL (ref 0.0–0.1)
EOS ABS: 0.4 10*3/uL (ref 0.0–0.5)
EOS%: 3.5 % (ref 0.0–7.0)
HCT: 30 % — ABNORMAL LOW (ref 34.8–46.6)
HEMOGLOBIN: 9.2 g/dL — AB (ref 11.6–15.9)
LYMPH%: 13.3 % — AB (ref 14.0–49.7)
MCH: 23.8 pg — ABNORMAL LOW (ref 25.1–34.0)
MCHC: 30.7 g/dL — ABNORMAL LOW (ref 31.5–36.0)
MCV: 77.5 fL — ABNORMAL LOW (ref 79.5–101.0)
MONO#: 0.7 10*3/uL (ref 0.1–0.9)
MONO%: 7.1 % (ref 0.0–14.0)
NEUT%: 75.4 % (ref 38.4–76.8)
NEUTROS ABS: 7.9 10*3/uL — AB (ref 1.5–6.5)
PLATELETS: 237 10*3/uL (ref 145–400)
RBC: 3.87 10*6/uL (ref 3.70–5.45)
RDW: 16.9 % — AB (ref 11.2–14.5)
WBC: 10.5 10*3/uL — AB (ref 3.9–10.3)
lymph#: 1.4 10*3/uL (ref 0.9–3.3)

## 2014-06-22 LAB — PROTIME-INR
INR: 1.2 — AB (ref 2.00–3.50)
PROTIME: 14.4 s — AB (ref 10.6–13.4)

## 2014-06-23 ENCOUNTER — Encounter (HOSPITAL_COMMUNITY)
Admission: RE | Disposition: A | Discharge status: Home / Self Care | Payer: Self-pay | Source: Ambulatory Visit | Attending: Internal Medicine

## 2014-06-23 ENCOUNTER — Ambulatory Visit (HOSPITAL_COMMUNITY)
Admission: RE | Admit: 2014-06-23 | Discharge: 2014-06-23 | Disposition: A | Discharge status: Home / Self Care | Payer: Medicare HMO | Source: Ambulatory Visit | Attending: Internal Medicine | Admitting: Internal Medicine

## 2014-06-23 ENCOUNTER — Encounter (HOSPITAL_COMMUNITY): Payer: Self-pay | Admitting: *Deleted

## 2014-06-23 ENCOUNTER — Ambulatory Visit (HOSPITAL_COMMUNITY): Payer: Medicare HMO | Admitting: Certified Registered Nurse Anesthetist

## 2014-06-23 ENCOUNTER — Encounter (HOSPITAL_COMMUNITY): Payer: Medicare HMO | Admitting: Certified Registered Nurse Anesthetist

## 2014-06-23 ENCOUNTER — Other Ambulatory Visit: Payer: Self-pay | Admitting: Internal Medicine

## 2014-06-23 DIAGNOSIS — D123 Benign neoplasm of transverse colon: Secondary | ICD-10-CM | POA: Diagnosis not present

## 2014-06-23 DIAGNOSIS — D509 Iron deficiency anemia, unspecified: Secondary | ICD-10-CM | POA: Diagnosis present

## 2014-06-23 DIAGNOSIS — Z882 Allergy status to sulfonamides status: Secondary | ICD-10-CM | POA: Diagnosis not present

## 2014-06-23 DIAGNOSIS — Z888 Allergy status to other drugs, medicaments and biological substances status: Secondary | ICD-10-CM | POA: Insufficient documentation

## 2014-06-23 DIAGNOSIS — Z800 Family history of malignant neoplasm of digestive organs: Secondary | ICD-10-CM | POA: Insufficient documentation

## 2014-06-23 DIAGNOSIS — J449 Chronic obstructive pulmonary disease, unspecified: Secondary | ICD-10-CM | POA: Diagnosis not present

## 2014-06-23 DIAGNOSIS — Z79899 Other long term (current) drug therapy: Secondary | ICD-10-CM | POA: Diagnosis not present

## 2014-06-23 DIAGNOSIS — K2960 Other gastritis without bleeding: Secondary | ICD-10-CM

## 2014-06-23 DIAGNOSIS — Z881 Allergy status to other antibiotic agents status: Secondary | ICD-10-CM | POA: Diagnosis not present

## 2014-06-23 DIAGNOSIS — K644 Residual hemorrhoidal skin tags: Secondary | ICD-10-CM | POA: Diagnosis not present

## 2014-06-23 DIAGNOSIS — I10 Essential (primary) hypertension: Secondary | ICD-10-CM | POA: Diagnosis not present

## 2014-06-23 DIAGNOSIS — Z7901 Long term (current) use of anticoagulants: Secondary | ICD-10-CM | POA: Insufficient documentation

## 2014-06-23 DIAGNOSIS — E669 Obesity, unspecified: Secondary | ICD-10-CM | POA: Insufficient documentation

## 2014-06-23 DIAGNOSIS — E039 Hypothyroidism, unspecified: Secondary | ICD-10-CM | POA: Diagnosis not present

## 2014-06-23 DIAGNOSIS — D126 Benign neoplasm of colon, unspecified: Secondary | ICD-10-CM

## 2014-06-23 DIAGNOSIS — M109 Gout, unspecified: Secondary | ICD-10-CM | POA: Diagnosis not present

## 2014-06-23 DIAGNOSIS — M069 Rheumatoid arthritis, unspecified: Secondary | ICD-10-CM | POA: Insufficient documentation

## 2014-06-23 DIAGNOSIS — K2900 Acute gastritis without bleeding: Secondary | ICD-10-CM

## 2014-06-23 DIAGNOSIS — K5790 Diverticulosis of intestine, part unspecified, without perforation or abscess without bleeding: Secondary | ICD-10-CM | POA: Insufficient documentation

## 2014-06-23 DIAGNOSIS — Z87891 Personal history of nicotine dependence: Secondary | ICD-10-CM | POA: Diagnosis not present

## 2014-06-23 DIAGNOSIS — D6861 Antiphospholipid syndrome: Secondary | ICD-10-CM | POA: Diagnosis not present

## 2014-06-23 DIAGNOSIS — Z7982 Long term (current) use of aspirin: Secondary | ICD-10-CM | POA: Diagnosis not present

## 2014-06-23 HISTORY — DX: Iron deficiency anemia, unspecified: D50.9

## 2014-06-23 HISTORY — DX: Von Willebrand's disease: D68.0

## 2014-06-23 HISTORY — DX: Adverse effect of unspecified anesthetic, initial encounter: T41.45XA

## 2014-06-23 HISTORY — DX: Other complications of anesthesia, initial encounter: T88.59XA

## 2014-06-23 HISTORY — PX: COLONOSCOPY WITH PROPOFOL: SHX5780

## 2014-06-23 HISTORY — PX: ESOPHAGOGASTRODUODENOSCOPY (EGD) WITH PROPOFOL: SHX5813

## 2014-06-23 HISTORY — DX: Von Willebrand disease, unspecified: D68.00

## 2014-06-23 SURGERY — ESOPHAGOGASTRODUODENOSCOPY (EGD) WITH PROPOFOL
Anesthesia: Monitor Anesthesia Care

## 2014-06-23 MED ORDER — SODIUM CHLORIDE 0.9 % IV SOLN: INTRAVENOUS | Status: DC

## 2014-06-23 MED ORDER — ONDANSETRON HCL 4 MG/2ML IJ SOLN
INTRAMUSCULAR | Status: AC
  Filled 2014-06-23: qty 2

## 2014-06-23 MED ORDER — PROPOFOL 10 MG/ML IV BOLUS
INTRAVENOUS | Status: AC
  Filled 2014-06-23: qty 20

## 2014-06-23 MED ORDER — ONDANSETRON HCL 4 MG/2ML IJ SOLN
INTRAMUSCULAR | Status: DC | PRN
  Administered 2014-06-23: 4 mg via INTRAVENOUS

## 2014-06-23 MED ORDER — KETAMINE HCL 10 MG/ML IJ SOLN
INTRAMUSCULAR | Status: AC
  Filled 2014-06-23: qty 1

## 2014-06-23 MED ORDER — LIDOCAINE HCL (CARDIAC) 20 MG/ML IV SOLN
INTRAVENOUS | Status: DC | PRN
  Administered 2014-06-23: 100 mg via INTRAVENOUS

## 2014-06-23 MED ORDER — LIDOCAINE HCL (CARDIAC) 20 MG/ML IV SOLN
INTRAVENOUS | Status: AC
  Filled 2014-06-23: qty 5

## 2014-06-23 MED ORDER — PROPOFOL INFUSION 10 MG/ML OPTIME
INTRAVENOUS | Status: DC | PRN
  Administered 2014-06-23: 160 ug/kg/min via INTRAVENOUS

## 2014-06-23 MED ORDER — PANTOPRAZOLE SODIUM 40 MG PO TBEC: 40.0000 mg | DELAYED_RELEASE_TABLET | Freq: Every day | ORAL | Status: DC

## 2014-06-23 MED ORDER — LACTATED RINGERS IV SOLN
INTRAVENOUS | Status: DC
  Administered 2014-06-23: 1000 mL via INTRAVENOUS

## 2014-06-23 MED ORDER — BUTAMBEN-TETRACAINE-BENZOCAINE 2-2-14 % EX AERO
INHALATION_SPRAY | CUTANEOUS | Status: DC | PRN
  Administered 2014-06-23: 2 via TOPICAL

## 2014-06-23 MED ORDER — KETAMINE HCL 10 MG/ML IJ SOLN
INTRAMUSCULAR | Status: DC | PRN
  Administered 2014-06-23: 20 mg via INTRAVENOUS

## 2014-06-23 MED ORDER — PROMETHAZINE HCL 25 MG/ML IJ SOLN: 6.2500 mg | INTRAMUSCULAR | Status: DC | PRN

## 2014-06-23 SURGICAL SUPPLY — 24 items

## 2014-06-23 NOTE — Interval H&P Note (Signed)
History and Physical Interval Note: For EGD/colon today to investigate iron def anemia and fam hx of colon cancer The nature of the procedure, as well as the risks, benefits, and alternatives were carefully and thoroughly reviewed with the patient. Ample time for discussion and questions allowed. The patient understood, was satisfied, and agreed to proceed.     06/23/2014 8:17 AM  Allie Bossier  has presented today for surgery, with the diagnosis of microcytic anemia  The various methods of treatment have been discussed with the patient and family. After consideration of risks, benefits and other options for treatment, the patient has consented to  Procedure(s): ESOPHAGOGASTRODUODENOSCOPY (EGD) WITH PROPOFOL (N/A) COLONOSCOPY WITH PROPOFOL (N/A) as a surgical intervention .  The patient's history has been reviewed, patient examined, no change in status, stable for surgery.  I have reviewed the patient's chart and labs.  Questions were answered to the patient's satisfaction.     [place] M

## 2014-06-23 NOTE — Transfer of Care (Signed)
Immediate Anesthesia Transfer of Care Note  Patient: Karen Chambers  Procedure(s) Performed: Procedure(s) (LRB): ESOPHAGOGASTRODUODENOSCOPY (EGD) WITH PROPOFOL (N/A) COLONOSCOPY WITH PROPOFOL (N/A)  Patient Location: PACU  Anesthesia Type: MAC  Level of Consciousness: sedated, patient cooperative and responds to stimulation  Airway & Oxygen Therapy: Patient Spontanous Breathing and Patient connected to face mask oxgen  Post-op Assessment: Report given to PACU RN and Post -op Vital signs reviewed and stable  Post vital signs: Reviewed and stable  Complications: No apparent anesthesia complications

## 2014-06-23 NOTE — Anesthesia Postprocedure Evaluation (Signed)
  Anesthesia Post-op Note  Patient: Karen Chambers  Procedure(s) Performed: Procedure(s) (LRB): ESOPHAGOGASTRODUODENOSCOPY (EGD) WITH PROPOFOL (N/A) COLONOSCOPY WITH PROPOFOL (N/A)  Patient Location: PACU  Anesthesia Type: MAC  Level of Consciousness: awake and alert   Airway and Oxygen Therapy: Patient Spontanous Breathing  Post-op Pain: mild  Post-op Assessment: Post-op Vital signs reviewed, Patient's Cardiovascular Status Stable, Respiratory Function Stable, Patent Airway and No signs of Nausea or vomiting  Last Vitals:  Filed Vitals:   06/23/14 0920  BP: 164/102  Pulse: 88  Temp:   Resp: 21    Post-op Vital Signs: stable   Complications: No apparent anesthesia complications

## 2014-06-23 NOTE — Anesthesia Preprocedure Evaluation (Signed)
Anesthesia Evaluation  Patient identified by MRN, date of birth, ID band Patient awake    Reviewed: Allergy & Precautions, H&P , NPO status , Patient's Chart, lab work & pertinent test results  Airway Mallampati: II TM Distance: >3 FB Neck ROM: Full    Dental no notable dental hx.    Pulmonary COPDformer smoker,  breath sounds clear to auscultation  Pulmonary exam normal       Cardiovascular hypertension, Pt. on medications Rhythm:Regular Rate:Normal     Neuro/Psych negative neurological ROS  negative psych ROS   GI/Hepatic negative GI ROS, Neg liver ROS,   Endo/Other  Hypothyroidism   Renal/GU negative Renal ROS  negative genitourinary   Musculoskeletal negative musculoskeletal ROS (+)   Abdominal   Peds negative pediatric ROS (+)  Hematology  (+) anemia , Von Willebrand disease   Dr. Jana Hakim follows    Anesthesia Other Findings   Reproductive/Obstetrics negative OB ROS                           Anesthesia Physical Anesthesia Plan  ASA: III  Anesthesia Plan: MAC   Post-op Pain Management:    Induction: Intravenous  Airway Management Planned: Nasal Cannula  Additional Equipment:   Intra-op Plan:   Post-operative Plan:   Informed Consent: I have reviewed the patients History and Physical, chart, labs and discussed the procedure including the risks, benefits and alternatives for the proposed anesthesia with the patient or authorized representative who has indicated his/her understanding and acceptance.   Dental advisory given  Plan Discussed with: CRNA and Surgeon  Anesthesia Plan Comments:         Anesthesia Quick Evaluation

## 2014-06-23 NOTE — H&P (View-Only) (Signed)
Patient ID: Karen Chambers, female   DOB: Jun 14, 1946, 68 y.o.   MRN: [id number] HPI: Karen Chambers is a 68 yo female with PMH of RA followed by Dr. Charlestine Night, antiphospholipid syndrome with weekly positive lupus anticoagulant and persistently elevated PTT followed by Dr. Jana Hakim, COPD followed by Dr. Chase Caller, CAD followed by Dr. Aundra Dubin, and followed from primary care by Joni Reining, PA-C who is seen to evaluate microcytic anemia in consultation at the request of Dr. Charlestine Night. He is here alone today. She reports she really wasn't aware of her anemia but was told recently that she needed to consider GI examination. She reports she is feeling well overall. She denies increased fatigue. She has lost 10 pounds which she says is intentional by cutting portions. She denies abdominal pain. No nausea or vomiting. No trouble swallowing. No early satiety. No change in bowel habit including no diarrhea or constipation. No blood in her stool or melena. She does report a long history of external hemorrhoids which flares very occasionally. She occasionally, though rarely, sees bright red blood with wiping. She does have a significant family history of colorectal cancer in her dad and sister. This occurred around age 31 and 24, respectively. She has never had a colonoscopy. She has attempted colonoscopy on 2 occasions but could not tolerate the oral prep due to vomiting. She does wear nocturnal oxygen.  [place] woman with a history of antiphospholipid syndrome with a weakly positive lupus anticoagulant, a persistent elevation of the PTT and elevated anticardiolipin antibodies, with a history of thalamic infarct October 2007, on lifelong Coumadin; other problems including rheumatoid arthritis, gout, hypertension, obesity, and COPD.    Past Medical History  Diagnosis Date  . Other and unspecified hyperlipidemia 401.9  . Hypothyroidism   . Obesity, unspecified   . Gout   . Low back pain   . Rheumatoid  arthritis(714.0)   . Cough 11/12/2010  . Hypertension   . Coagulopathy   . COPD (chronic obstructive pulmonary disease)     Past Surgical History  Procedure Laterality Date  . Cholecystectomy    . Total abdominal hysterectomy      Outpatient Prescriptions Prior to Visit  Medication Sig Dispense Refill  . amLODipine (NORVASC) 10 MG tablet Take 1 tablet (10 mg total) by mouth daily.  90 tablet  2  . aspirin 81 MG tablet Take 81 mg by mouth daily.        Marland Kitchen atorvastatin (LIPITOR) 20 MG tablet TAKE 1 TABLET EVERY DAY  90 tablet  4  . febuxostat (ULORIC) 40 MG tablet Take 40 mg by mouth daily.        Marland Kitchen levothyroxine (SYNTHROID, LEVOTHROID) 100 MCG tablet TAKE 1 TABLET DAILY  90 tablet  3  . losartan (COZAAR) 100 MG tablet TAKE 1 TABLET EVERY DAY  90 tablet  3  . metoprolol succinate (TOPROL-XL) 100 MG 24 hr tablet Take 1 tablet (100 mg total) by mouth 2 (two) times daily. Take with or immediately following a meal.  180 tablet  3  . Multiple Vitamin (MULTIVITAMIN) capsule Take 1 capsule by mouth daily.        . Omega 3 1200 MG CAPS Take 1 capsule by mouth daily.      . potassium chloride SA (KLOR-CON M20) 20 MEQ tablet Take 1 tablet (20 mEq total) by mouth daily.  90 tablet  3  . SPIRIVA HANDIHALER 18 MCG inhalation capsule INHALE ONE CAPSULE ONCE DAILY  30 capsule  9  .  traMADol (ULTRAM) 50 MG tablet Take 100 mg by mouth every 6 (six) hours as needed.      . warfarin (COUMADIN) 5 MG tablet Take 5 mg daily or as instructed by the coumadin clinic  105 tablet  3   No facility-administered medications prior to visit.    Allergies  Allergen Reactions  . Allopurinol Itching, Nausea Only, Rash and Other (See Comments)    Top of head to tip of toes with rash; aches all over  . Cephalexin Other (See Comments)    Severe case of thrush in mouth/tongue.  Tongue raw and hard and swollen.  . Nitrofurantoin Hives, Itching, Nausea And Vomiting, Rash and Other (See Comments)    Body aches, like I have  the flu  . Ace Inhibitors Other (See Comments)    cough  . Sulfonamide Derivatives Hives, Itching and Rash    Family History  Problem Relation Age of Onset  . Cancer Father   . Heart disease Mother     History  Substance Use Topics  . Smoking status: Former Smoker -- 1.50 packs/day for 36 years    Types: Cigarettes    Quit date: 07/05/1999  . Smokeless tobacco: Never Used     Comment: 1ppd x 36 years  . Alcohol Use: No    ROS: As per history of present illness, otherwise negative  BP 138/76  Pulse 68  Ht 5\' 3"  (1.6 m)  Wt 165 lb 6.4 oz (75.025 kg)  BMI 29.31 kg/m2 Constitutional: Well-developed and well-nourished. No distress. HEENT: Normocephalic and atraumatic. Oropharynx is clear and moist. No oropharyngeal exudate. Conjunctivae are normal.  No scleral icterus. Neck: Neck supple. Trachea midline. Cardiovascular: Normal rate, regular rhythm and intact distal pulses. 2/6 sem Pulmonary/chest: Effort normal and breath sounds normal. No wheezing, rales or rhonchi. Abdominal: Soft, nontender, nondistended. Bowel sounds active throughout.  Extremities: no clubbing, cyanosis, or edema Lymphadenopathy: No cervical adenopathy noted. Neurological: Alert and oriented to person place and time. Skin: Skin is warm and dry. No rashes noted. Psychiatric: Normal mood and affect. Behavior is normal.  RELEVANT LABS AND IMAGING: CBC    Component Value Date/Time   WBC 8.8 05/30/2014 1334   RBC 3.95 05/30/2014 1334   HGB 9.4* 05/30/2014 1334   HCT 31.1* 05/30/2014 1334   PLT 215 05/30/2014 1334   MCV 78.7* 05/30/2014 1334   MCH 23.8* 05/30/2014 1334   MCH 29.4 10/08/2010 1216   MCHC 30.2* 05/30/2014 1334   RDW 16.4* 05/30/2014 1334   LYMPHSABS 1.8 05/30/2014 1334   MONOABS 0.8 05/30/2014 1334   EOSABS 0.4 05/30/2014 1334   BASOSABS 0.1 05/30/2014 1334    CMP     Component Value Date/Time   NA 139 12/19/2013 1400   K 3.9 12/19/2013 1400   CL 106 12/19/2013 1400   CO2 26 12/19/2013 1400   GLUCOSE  125* 12/19/2013 1400   BUN 21 12/19/2013 1400   CREATININE 1.4* 12/19/2013 1400   CALCIUM 9.0 12/19/2013 1400   GFRNONAA 41.70* 09/02/2010 1144    ASSESSMENT/PLAN:  68 yo female with PMH of RA followed by Dr. Charlestine Night, antiphospholipid syndrome with weekly positive lupus anticoagulant and persistently elevated PTT followed by Dr. Jana Hakim, COPD followed by Dr. Chase Caller, CAD followed by Dr. Aundra Dubin, and followed from primary care by Joni Reining, PA-C who is seen to evaluate microcytic anemia in consultation at the request of Dr. Charlestine Night.  1.  Microcytic anemia/FH of colon cancer -- I have recommended upper endoscopy and  colonoscopy to better evaluate her microcytic anemia and also given her family history of colorectal cancer. She has never had prior colorectal cancer screening. Her anemia is somewhat new over the past year but has been persistent for several months. There has been no visible bleeding. She does take warfarin and we need to obtain permission from Dr. Jana Hakim to hold anticoagulation. She may need Lovenox bridge but will defer this decision to him.  I will check iron studies today and celiac panel.  2. COPD on nighttime oxygen -- stable, recently seen by pulmonary. Her about procedures need to be performed in a hospital setting with monitored anesthesia care.  3. Coagulopathy -- see above regarding anticoagulation management for procedures

## 2014-06-23 NOTE — Op Note (Signed)
[facility] [place], [address]   ENDOSCOPY PROCEDURE REPORT  PATIENT: Karen, Chambers  MR#: [id number] BIRTHDATE: 09-15-1946 , 54  yrs. old GENDER: female ENDOSCOPIST: Jerene Bears, MD REFERRED BY:  Larey Dresser, M.D. PROCEDURE DATE:  06/23/2014 PROCEDURE:  EGD w/ biopsy ASA CLASS:     Class III INDICATIONS:  iron deficiency anemia. MEDICATIONS: Monitored anesthesia care and Per Anesthesia TOPICAL ANESTHETIC: Cetacaine Spray  DESCRIPTION OF PROCEDURE: After the risks benefits and alternatives of the procedure were thoroughly explained, informed consent was obtained.  The Pentax Gastroscope [id number] endoscope was introduced through the mouth and advanced to the second portion of the duodenum , Without limitations.  The instrument was slowly withdrawn as the mucosa was fully examined.   ESOPHAGUS: The mucosa of the esophagus appeared normal.  STOMACH: A sessile polyp measuring 3 mm in size was found at the gastroesophageal junction (gastric side).  A polypectomy was performed with a cold forceps.  The resection was complete and the polyp tissue was completely retrieved.   Mild erosive gastritis (inflammation) was found in the gastric antrum.  Cold forcep biopsies were taken at the gastric body, antrum and angularis to evaluate for h.  pylori.  DUODENUM: The duodenal mucosa showed no abnormalities in the bulb and 2nd part of the duodenum.  Cold forceps biopsies were taken in the bulb and second portion.  Retroflexed views revealed no abnormalities.     The scope was then withdrawn from the patient and the procedure completed.  COMPLICATIONS: There were no immediate complications.  ENDOSCOPIC IMPRESSION: 1.   The mucosa of the esophagus appeared normal 2.   Sessile polyp measuring 3 mm in size was found at the gastroesophageal junction; polypectomy was performed 3.   Erosive gastritis (inflammation) was found in the gastric antrum;  biopsies 4.   The duodenal mucosa showed no abnormalities in the bulb and 2nd part of the duodenum cold forceps biopsies were taken in the bulb and second portion  RECOMMENDATIONS: 1.  Await pathology results 2.  Begin pantoprazole 40 mg daily given gastric erosions  eSigned:  Jerene Bears, MD 06/23/2014 9:38 AMn   CC:The Patient and Loralie Champagne, MD and Joni Reining, PA-C ([facility]), Dr. Charlestine Night ([facility])  CPT CODES:     43239@Upper  gastrointestinal endoscopy including esophagus, stomach, and either the duodenum and/or jejunum as appropriate; with biopsy, single or multiple ICD CODES:  The ICD and CPT codes recommended by this software are interpretations from the data that the clinical staff has captured with the software.  The verification of the translation of this report to the ICD and CPT codes and modifiers is the sole responsibility of the health care institution and practicing physician where this report was generated.  [employer]. will not be held responsible for the validity of the ICD and CPT codes included on this report.  AMA assumes no liability for data contained or not contained herein. CPT is a Designer, television/film set of the [employer].  PATIENT NAME:  Karen, Chambers MR#: [id number]

## 2014-06-23 NOTE — Discharge Instructions (Signed)
Esophagogastroduodenoscopy °Care After °Refer to this sheet in the next few weeks. These instructions provide you with information on caring for yourself after your procedure. Your caregiver may also give you more specific instructions. Your treatment has been planned according to current medical practices, but problems sometimes occur. Call your caregiver if you have any problems or questions after your procedure.  °HOME CARE INSTRUCTIONS °· Do not eat or drink anything until the numbing medicine (local anesthetic) has worn off and your gag reflex has returned. You will know that the local anesthetic has worn off when you can swallow comfortably. °· Do not drive for 12 hours after the procedure or as directed by your caregiver. °· Only take medicines as directed by your caregiver. °SEEK MEDICAL CARE IF:  °· You cannot stop coughing. °· You are not urinating at all or less than usual. °SEEK IMMEDIATE MEDICAL CARE IF: °· You have difficulty swallowing. °· You cannot eat or drink. °· You have worsening throat or chest pain. °· You have dizziness, lightheadedness, or you faint. °· You have nausea or vomiting. °· You have chills. °· You have a fever. °· You have severe abdominal pain. °· You have black, tarry, or bloody stools. °Document Released: 08/25/2012 Document Reviewed: 08/25/2012 °ExitCare® Patient Information ©2015 Exit[employer] LLC. This information is not intended to replace advice given to you by your health care provider. Make sure you discuss any questions you have with your health care provider. ° °Colonoscopy, Care After °Refer to this sheet in the next few weeks. These instructions provide you with information on caring for yourself after your procedure. Your health care provider may also give you more specific instructions. Your treatment has been planned according to current medical practices, but problems sometimes occur. Call your health care provider if you have any problems or questions after your  procedure. °WHAT TO EXPECT AFTER THE PROCEDURE  °After your procedure, it is typical to have the following: °· A small amount of blood in your stool. °· Moderate amounts of gas and mild abdominal cramping or bloating. °HOME CARE INSTRUCTIONS °· Do not drive, operate machinery, or sign important documents for 24 hours. °· You may shower and resume your regular physical activities, but move at a slower pace for the first 24 hours. °· Take frequent rest periods for the first 24 hours. °· Walk around or put a warm pack on your abdomen to help reduce abdominal cramping and bloating. °· Drink enough fluids to keep your urine clear or pale yellow. °· You may resume your normal diet as instructed by your health care provider. Avoid heavy or fried foods that are hard to digest. °· Avoid drinking alcohol for 24 hours or as instructed by your health care provider. °· Only take over-the-counter or prescription medicines as directed by your health care provider. °· If a tissue sample (biopsy) was taken during your procedure: °¨ Do not take aspirin or blood thinners for 7 days, or as instructed by your health care provider. °¨ Do not drink alcohol for 7 days, or as instructed by your health care provider. °¨ Eat soft foods for the first 24 hours. °SEEK MEDICAL CARE IF: °You have persistent spotting of blood in your stool 2-3 days after the procedure. °SEEK IMMEDIATE MEDICAL CARE IF: °· You have more than a small spotting of blood in your stool. °· You pass large blood clots in your stool. °· Your abdomen is swollen (distended). °· You have nausea or vomiting. °· You have a   fever. °· You have increasing abdominal pain that is not relieved with medicine. °Document Released: 04/22/2004 Document Revised: 06/29/2013 Document Reviewed: 05/16/2013 °ExitCare® Patient Information ©2015 ExitCare, LLC. This information is not intended to replace advice given to you by your health care provider. Make sure you discuss any questions you have  with your health care provider. ° ° ° °

## 2014-06-23 NOTE — Op Note (Signed)
[facility] [place], [address]   COLONOSCOPY PROCEDURE REPORT  PATIENT: Karen Chambers, Karen Chambers  MR#: [id number] BIRTHDATE: [birth year] , 17  yrs. old GENDER: female ENDOSCOPIST: Jerene Bears, MD REFERRED IR:[id number] Claris Gladden, M.D. PROCEDURE DATE:  06/23/2014 PROCEDURE:   Colonoscopy with snare polypectomy First Screening Colonoscopy - Avg.  risk and is 50 yrs.  old or older - No.  Prior Negative Screening - Now for repeat screening. N/A  History of Adenoma - Now for follow-up colonoscopy & has been > or = to 3 yrs.  N/A  Polyps Removed Today? Yes. ASA CLASS:   Class III INDICATIONS:iron deficiency anemia, patient's immediate family history of colon cancer, and first colonoscopy. MEDICATIONS: Monitored anesthesia care and Per Anesthesia  DESCRIPTION OF PROCEDURE:   After the risks benefits and alternatives of the procedure were thoroughly explained, informed consent was obtained.  The digital rectal exam revealed external hemorrhoids.   The Pentax Ped Colon [id number]  endoscope was introduced through the anus and advanced to the cecum, which was identified by both the appendix and ileocecal valve. No adverse events experienced.   The quality of the prep was good, using MoviPrep  The instrument was then slowly withdrawn as the colon was fully examined.   COLON FINDINGS: Three sessile polyps ranging from 4 to 57mm in size were found in the descending colon, transverse colon, and ascending colon.  Polypectomies were performed with a cold snare.  The resection was complete, the polyp tissue was completely retrieved and sent to histology.   There was mild diverticulosis noted in the sigmoid colon.  Retroflexed views revealed external hemorrhoids. The scope was withdrawn and the procedure completed. COMPLICATIONS: There were no immediate complications.  ENDOSCOPIC IMPRESSION: 1.   Three sessile polyps ranging from 4 to 59mm in size were found in the descending  colon, transverse colon, and ascending colon; polypectomies were performed with a cold snare 2.   Mild diverticulosis was noted in the sigmoid colon  RECOMMENDATIONS: 1.  Await pathology results 2.  Timing of repeat colonoscopy will be determined by pathology findings. 3.  You will receive a letter within 1-2 weeks with the results of your biopsy as well as final recommendations.  Please call my office if you have not received a letter after 3 weeks. 4.  Monitor Hgb, iron studies, replace iron as needed  eSigned:  Jerene Bears, MD 06/23/2014 9:42 AM   cc: The Patient and Loralie Champagne, MD

## 2014-06-24 ENCOUNTER — Ambulatory Visit (HOSPITAL_BASED_OUTPATIENT_CLINIC_OR_DEPARTMENT_OTHER): Payer: Medicare HMO

## 2014-06-24 VITALS — BP 161/83 | HR 91 | Temp 98.0°F

## 2014-06-24 DIAGNOSIS — D6861 Antiphospholipid syndrome: Secondary | ICD-10-CM | POA: Diagnosis not present

## 2014-06-24 MED ORDER — ENOXAPARIN SODIUM 120 MG/0.8ML ~~LOC~~ SOLN
1.5000 mg/kg | Freq: Once | SUBCUTANEOUS | Status: AC
  Administered 2014-06-24: 120 mg via SUBCUTANEOUS

## 2014-06-26 ENCOUNTER — Ambulatory Visit (HOSPITAL_BASED_OUTPATIENT_CLINIC_OR_DEPARTMENT_OTHER): Payer: Medicare HMO

## 2014-06-26 ENCOUNTER — Encounter (HOSPITAL_COMMUNITY): Payer: Self-pay | Admitting: Internal Medicine

## 2014-06-26 ENCOUNTER — Other Ambulatory Visit (HOSPITAL_BASED_OUTPATIENT_CLINIC_OR_DEPARTMENT_OTHER): Payer: Medicare HMO

## 2014-06-26 ENCOUNTER — Ambulatory Visit (INDEPENDENT_AMBULATORY_CARE_PROVIDER_SITE_OTHER): Payer: Self-pay | Admitting: Pharmacist

## 2014-06-26 VITALS — BP 170/93 | HR 91 | Temp 98.3°F

## 2014-06-26 DIAGNOSIS — D6861 Antiphospholipid syndrome: Secondary | ICD-10-CM

## 2014-06-26 DIAGNOSIS — D689 Coagulation defect, unspecified: Secondary | ICD-10-CM

## 2014-06-26 LAB — POCT INR: INR: 1.1

## 2014-06-26 LAB — PROTIME-INR
INR: 1.1 — ABNORMAL LOW (ref 2.00–3.50)
Protime: 13.2 Seconds (ref 10.6–13.4)

## 2014-06-26 MED ORDER — ENOXAPARIN SODIUM 120 MG/0.8ML ~~LOC~~ SOLN
1.5000 mg/kg | Freq: Once | SUBCUTANEOUS | Status: AC
  Administered 2014-06-26: 120 mg via SUBCUTANEOUS
  Filled 2014-06-26: qty 0.8

## 2014-06-26 NOTE — Progress Notes (Signed)
INR = 1.1 Pt is s/p EGD & colonoscopy on 06/23/14. She restarted her Coumadin on 10/3 - dose is back to 5 mg daily except 2.5 mg on Wed/Sat. She is on Lovenox 120 mg SQ daily & will receive another dose today & tomorrow. We have scheduled her for lab/Coumadin clinic on 06/28/14. Dr. Jana Hakim aware of plan above and in agreement. Kennith Center, Pharm.D., CPP 06/26/2014@2 :30 PM

## 2014-06-27 ENCOUNTER — Ambulatory Visit (HOSPITAL_BASED_OUTPATIENT_CLINIC_OR_DEPARTMENT_OTHER): Payer: Medicare HMO

## 2014-06-27 VITALS — BP 155/81 | HR 85 | Temp 98.1°F

## 2014-06-27 DIAGNOSIS — D6861 Antiphospholipid syndrome: Secondary | ICD-10-CM

## 2014-06-27 DIAGNOSIS — D689 Coagulation defect, unspecified: Secondary | ICD-10-CM | POA: Diagnosis not present

## 2014-06-27 MED ORDER — ENOXAPARIN SODIUM 120 MG/0.8ML ~~LOC~~ SOLN
1.5000 mg/kg | Freq: Once | SUBCUTANEOUS | Status: AC
  Administered 2014-06-27: 120 mg via SUBCUTANEOUS
  Filled 2014-06-27: qty 0.8

## 2014-06-28 ENCOUNTER — Encounter: Payer: Self-pay | Admitting: Pharmacist

## 2014-06-28 ENCOUNTER — Other Ambulatory Visit: Payer: Commercial Managed Care - HMO

## 2014-06-28 ENCOUNTER — Ambulatory Visit: Payer: Commercial Managed Care - HMO

## 2014-06-28 ENCOUNTER — Encounter: Payer: Self-pay | Admitting: Internal Medicine

## 2014-06-28 NOTE — Progress Notes (Signed)
Pt FTKA today.  I spoke to Ms Rickert and she has a gout flare.  She will call back to reschedule when feeling better

## 2014-07-05 ENCOUNTER — Telehealth: Payer: Self-pay | Admitting: Pharmacist

## 2014-07-05 ENCOUNTER — Telehealth: Payer: Self-pay | Admitting: Oncology

## 2014-07-05 NOTE — Telephone Encounter (Signed)
pt cld to r/s appt-gave pt r/s time & date °

## 2014-07-05 NOTE — Telephone Encounter (Addendum)
Pt called and stated that she was able to r/s her missed lab/CC (due to gout attack) from last week (06/28/14) to 07/07/14. She wanted to add the coumadin clinic visit on 07/07/14. The coumadin clinic schedule was booked for that day. Pt was ok to R/S to 07/10/14: Lab at 1:30pm and CC = 1:45pm so that she could be seen in the coumadin clinic also.  Pt stated that she was started on an Iron supplement and Protonix. It is possible that Protonix may interact and increase her INR. She was doing better. No problems or concerns regarding anticoagulation.

## 2014-07-07 ENCOUNTER — Other Ambulatory Visit: Payer: Commercial Managed Care - HMO

## 2014-07-10 ENCOUNTER — Other Ambulatory Visit (HOSPITAL_BASED_OUTPATIENT_CLINIC_OR_DEPARTMENT_OTHER): Payer: Medicare HMO

## 2014-07-10 ENCOUNTER — Ambulatory Visit (HOSPITAL_BASED_OUTPATIENT_CLINIC_OR_DEPARTMENT_OTHER): Payer: Self-pay | Admitting: Pharmacist

## 2014-07-10 DIAGNOSIS — D689 Coagulation defect, unspecified: Secondary | ICD-10-CM

## 2014-07-10 LAB — PROTIME-INR
INR: 3.2 (ref 2.00–3.50)
Protime: 38.4 Seconds — ABNORMAL HIGH (ref 10.6–13.4)

## 2014-07-10 LAB — POCT INR: INR: 3.2

## 2014-07-10 NOTE — Progress Notes (Signed)
INR back to goal of 2-3.5.  Protonix and Intergra Plus have been added to Karen Chambers's medication list.  Neither of these should interact with Coumadin.  No bleeding or bruising.  Karen Chambers had been holding coumadin around EGD/colonoscopy on 06/23/14 and resumed on 06/26/14.  Karen Chambers INR has been at goal on current coumadin dose since 02/2014 so will continue coumadin 2.5mg  W/Sat and 5mg  other days.  Will check PT/INR in 1 month with next scheduled lab.

## 2014-07-11 ENCOUNTER — Telehealth: Payer: Self-pay | Admitting: Oncology

## 2014-07-11 NOTE — Telephone Encounter (Signed)
per Melissa in Cc t sch CC-pt aware

## 2014-07-18 ENCOUNTER — Telehealth: Payer: Self-pay | Admitting: Oncology

## 2014-07-18 NOTE — Telephone Encounter (Signed)
, °

## 2014-07-19 ENCOUNTER — Other Ambulatory Visit: Payer: Medicare HMO

## 2014-08-16 ENCOUNTER — Other Ambulatory Visit: Payer: Medicare HMO

## 2014-08-16 ENCOUNTER — Ambulatory Visit: Payer: Commercial Managed Care - HMO

## 2014-08-16 ENCOUNTER — Telehealth: Payer: Self-pay | Admitting: Oncology

## 2014-08-16 ENCOUNTER — Telehealth: Payer: Self-pay | Admitting: Pharmacist

## 2014-08-16 NOTE — Telephone Encounter (Signed)
per Gerald Stabs to CX CC-pt unable to make appt

## 2014-08-16 NOTE — Telephone Encounter (Signed)
Patient called to cancel coumadin clinic and lab appointments for today. Pt has a family emergency and will call us back to reschedule.   Thank you,  Montel Clock, Pharm.D., BCOP

## 2014-08-22 ENCOUNTER — Telehealth: Payer: Self-pay | Admitting: Oncology

## 2014-08-22 NOTE — Telephone Encounter (Signed)
pt cld to r/s CC & lab-gave pt new time & date Gerald Stabs is CC veri

## 2014-08-29 ENCOUNTER — Ambulatory Visit: Payer: Commercial Managed Care - HMO

## 2014-08-29 ENCOUNTER — Other Ambulatory Visit: Payer: Commercial Managed Care - HMO

## 2014-08-30 ENCOUNTER — Other Ambulatory Visit (HOSPITAL_BASED_OUTPATIENT_CLINIC_OR_DEPARTMENT_OTHER): Payer: Medicare HMO

## 2014-08-30 ENCOUNTER — Ambulatory Visit (HOSPITAL_BASED_OUTPATIENT_CLINIC_OR_DEPARTMENT_OTHER): Payer: Medicare HMO | Admitting: Pharmacist

## 2014-08-30 DIAGNOSIS — D689 Coagulation defect, unspecified: Secondary | ICD-10-CM | POA: Diagnosis not present

## 2014-08-30 LAB — PROTIME-INR
INR: 4.1 — AB (ref 2.00–3.50)
Protime: 49.2 Seconds — ABNORMAL HIGH (ref 10.6–13.4)

## 2014-08-30 NOTE — Patient Instructions (Signed)
INR slightly above goal today Plan: No coumadin tonight then decrease Coumadin to 5mg  daily except 2.5 mg Mondays, Wednesdays and Saturdays.  Recheck INR on 10/18/14; lab at 1:45 pm and Coumadin clinic at 2 pm.

## 2014-08-30 NOTE — Progress Notes (Signed)
INR slightly above goal today Pt is doing well with no complaints She recently starting on oral iron since we last saw her. This has caused her appetite to decrease some as the iron is causing some GI side effects which is expected She is to call her doctor who prescribed the iron to see about IV iron replacement No missed or extra doses No other medication or diet changes No unusual bleeding or bruising Will slightly decrease dose due to lack of appetite and rising INR Plan: No coumadin tonight then decrease Coumadin to 5mg  daily except 2.5 mg Mondays, Wednesdays and Saturdays.  Recheck INR on 10/18/14; lab at 1:45 pm and Coumadin clinic at 2 pm.

## 2014-08-31 ENCOUNTER — Telehealth: Payer: Self-pay | Admitting: Oncology

## 2014-08-31 NOTE — Telephone Encounter (Signed)
per Chris to sch CC-pt aware °

## 2014-09-01 ENCOUNTER — Telehealth: Payer: Self-pay | Admitting: Oncology

## 2014-09-01 NOTE — Telephone Encounter (Signed)
per Tim Lair to CX pt lab for 12/23-pt aware

## 2014-09-13 ENCOUNTER — Other Ambulatory Visit: Payer: Medicare HMO

## 2014-09-29 ENCOUNTER — Telehealth: Payer: Self-pay | Admitting: *Deleted

## 2014-09-29 MED ORDER — AMLODIPINE BESYLATE 10 MG PO TABS: 10.0000 mg | ORAL_TABLET | Freq: Every evening | ORAL | Status: DC

## 2014-09-29 NOTE — Telephone Encounter (Signed)
If she is sure she has been taking 10 mg daily, refill at 10 mg daily. BP has been high in the past.

## 2014-09-29 NOTE — Telephone Encounter (Signed)
Will forward to Dr McLean 

## 2014-09-29 NOTE — Telephone Encounter (Signed)
Patient requests amlodipine refill. Her last office visit has that she takes 5mg  qd, but she states that her bottle says 10mg . She does not recall this being changed and is thinking that she has always been on 10mg . Please advise on what current therapy should be. Thank, MI

## 2014-10-05 ENCOUNTER — Other Ambulatory Visit: Payer: Self-pay | Admitting: Internal Medicine

## 2014-10-09 ENCOUNTER — Telehealth: Payer: Self-pay | Admitting: Internal Medicine

## 2014-10-09 MED ORDER — TIOTROPIUM BROMIDE MONOHYDRATE 18 MCG IN CAPS: 18.0000 ug | ORAL_CAPSULE | Freq: Every morning | RESPIRATORY_TRACT | Status: DC

## 2014-10-09 NOTE — Telephone Encounter (Signed)
Called and spoke to pt. Pt requested refill of Spiriva handihaler. Rx sent to preferred pharmacy. Pt verbalized understanding and denied any further questions or concerns at this time.

## 2014-10-18 ENCOUNTER — Ambulatory Visit (HOSPITAL_BASED_OUTPATIENT_CLINIC_OR_DEPARTMENT_OTHER): Payer: Medicare HMO | Admitting: Pharmacist

## 2014-10-18 ENCOUNTER — Other Ambulatory Visit (HOSPITAL_BASED_OUTPATIENT_CLINIC_OR_DEPARTMENT_OTHER): Payer: Medicare HMO

## 2014-10-18 DIAGNOSIS — D689 Coagulation defect, unspecified: Secondary | ICD-10-CM

## 2014-10-18 LAB — PROTIME-INR
INR: 3.5 (ref 2.00–3.50)
PROTIME: 42 s — AB (ref 10.6–13.4)

## 2014-10-18 LAB — POCT INR: INR: 3.5

## 2014-10-18 NOTE — Progress Notes (Signed)
INR at goal today at 3.5 Pt is doing well with no complaints She has stopped taking her oral iron due to GI side effects She is feeling much better as is "back to her normal self" She has a normal appetite now No other diet or medication changes No unusual bleeding or bruising No missed or extra doses Plan: No Changes Continue Coumadin 5mg  daily except 2.5 mg Mondays, Wednesdays and Saturdays.  Recheck INR on 11/15/14; lab at 1:45 pm and Coumadin clinic at 2 pm.

## 2014-10-18 NOTE — Patient Instructions (Addendum)
INR at goal No Changes Continue Coumadin 5mg  daily except 2.5 mg Mondays, Wednesdays and Saturdays.  Recheck INR on 11/15/14; lab at 1:45 pm and Coumadin clinic at 2 pm.

## 2014-10-19 ENCOUNTER — Telehealth: Payer: Self-pay | Admitting: Oncology

## 2014-10-19 NOTE — Telephone Encounter (Signed)
per chris CC to sch pt CC-pt aware

## 2014-10-30 ENCOUNTER — Encounter: Payer: Self-pay | Admitting: Physician Assistant

## 2014-10-30 ENCOUNTER — Ambulatory Visit (INDEPENDENT_AMBULATORY_CARE_PROVIDER_SITE_OTHER): Payer: Medicare HMO | Admitting: Physician Assistant

## 2014-10-30 VITALS — BP 132/94 | HR 80 | Ht 63.0 in | Wt 158.1 lb

## 2014-10-30 DIAGNOSIS — D6861 Antiphospholipid syndrome: Secondary | ICD-10-CM

## 2014-10-30 DIAGNOSIS — I10 Essential (primary) hypertension: Secondary | ICD-10-CM

## 2014-10-30 DIAGNOSIS — E785 Hyperlipidemia, unspecified: Secondary | ICD-10-CM

## 2014-10-30 DIAGNOSIS — R011 Cardiac murmur, unspecified: Secondary | ICD-10-CM

## 2014-10-30 DIAGNOSIS — R010 Benign and innocent cardiac murmurs: Secondary | ICD-10-CM

## 2014-10-30 NOTE — Assessment & Plan Note (Signed)
No thrombotic event in the past several years, maintained on warfarin.

## 2014-10-30 NOTE — Assessment & Plan Note (Signed)
Needs fasting lipid panel. On atorvastatin.  She has regular blood work done by Dr. Charlestine Night and will ask him to draw.

## 2014-10-30 NOTE — Assessment & Plan Note (Signed)
BP elevated today, but controlled at home.

## 2014-10-30 NOTE — Progress Notes (Signed)
Cardiology Office Note   Date:  10/30/2014   ID:  MAECI KALBFLEISCH, DOB 04/26/1946, MRN [id number]  PCP:  Aura Dials, PA-C  Cardiologist:  Dr. Aundra Dubin  No chief complaint on file.     History of Present Illness: Karen Chambers is a 69 y.o. female who presents for yearly follow up. She has a history of HTN, hyperlipidemia, COPD, and antiphospholipid antibody syndrome returns for cardiology followup.  She continues to use oxygen at night and on occasion with exertion because of COPD.  She denies chest pain, dyspnea, dyspnea, dizziness or presyncope.  BP is elevated today but has been < 140/90 when she checks at home. Her main complain is pain secondary to rheumatoid arthritis. She also became anemic back in October which prompted an endoscopy and colonoscopy which didn't show reason for a bleed. She is followed by Dr. Charlestine Night.   Past Medical History  Diagnosis Date  . Other and unspecified hyperlipidemia 401.9  . Hypothyroidism   . Obesity, unspecified   . Gout   . Low back pain     buldging disc ,and herniated disc  . Rheumatoid arthritis(714.0)   . Cough 11/12/2010  . Hypertension   . Coagulopathy   . COPD (chronic obstructive pulmonary disease)   . Complication of anesthesia     slow awaken from anesthesia  . Von Willebrand disease     Dr. Jana Hakim follows    Past Surgical History  Procedure Laterality Date  . Cholecystectomy    . Total abdominal hysterectomy    . Stapedectomy Bilateral     right ear hearing aid  . Esophagogastroduodenoscopy (egd) with propofol N/A 06/23/2014    Procedure: ESOPHAGOGASTRODUODENOSCOPY (EGD) WITH PROPOFOL;  Surgeon: Jerene Bears, MD;  Location: [facility];  Service: Gastroenterology;  Laterality: N/A;  . Colonoscopy with propofol N/A 06/23/2014    Procedure: COLONOSCOPY WITH PROPOFOL;  Surgeon: Jerene Bears, MD;  Location: [facility];  Service: Gastroenterology;  Laterality: N/A;     Current Outpatient Prescriptions    Medication Sig Dispense Refill  . acetaminophen (TYLENOL ARTHRITIS PAIN) 650 MG CR tablet Take 650-1,300 mg by mouth 2 (two) times daily.    Marland Kitchen amLODipine (NORVASC) 10 MG tablet Take 1 tablet (10 mg total) by mouth every evening. 30 tablet 0  . aspirin 81 MG tablet Take 81 mg by mouth at bedtime.     Marland Kitchen atorvastatin (LIPITOR) 20 MG tablet Take 20 mg by mouth at bedtime.    . febuxostat (ULORIC) 40 MG tablet Take 40 mg by mouth every morning.     . leflunomide (ARAVA) 20 MG tablet Take 20 mg by mouth every morning.     Marland Kitchen levothyroxine (SYNTHROID, LEVOTHROID) 100 MCG tablet Take 100 mcg by mouth daily before breakfast.    . losartan (COZAAR) 100 MG tablet Take 100 mg by mouth every evening.    . metoprolol succinate (TOPROL-XL) 100 MG 24 hr tablet Take 100 mg by mouth 2 (two) times daily. Take with or immediately following a meal.    . Multiple Vitamin (MULTIVITAMIN) capsule Take 1 capsule by mouth daily at 12 noon.     . Omega 3 1200 MG CAPS Take 1 capsule by mouth daily at 12 noon.     . potassium chloride SA (K-DUR,KLOR-CON) 20 MEQ tablet Take 20 mEq by mouth at bedtime.    Marland Kitchen SPIRIVA HANDIHALER 18 MCG inhalation capsule INHALE ONE CAPSULE ONCE DAILY 30 capsule 2  . tiotropium (SPIRIVA) 18 MCG inhalation  capsule Place 1 capsule (18 mcg total) into inhaler and inhale every morning. 30 capsule 5  . traMADol (ULTRAM) 50 MG tablet Take 100 mg by mouth 2 (two) times daily.     Marland Kitchen warfarin (COUMADIN) 5 MG tablet Take 2.5-5 mg by mouth daily. Takes one tablet (5 mg) Monday, Tues, Thurs, Fri, & sun. Takes half a tablet (2.5 mg) on Wed & Saturday.    Marland Kitchen MOVIPREP 100 G SOLR Take 1 kit (200 g total) by mouth once. (Patient not taking: Reported on 10/30/2014) 1 kit 0   No current facility-administered medications for this visit.    Allergies:   Allopurinol; Cephalexin; Nitrofurantoin; Ace inhibitors; and Sulfonamide derivatives    Social History:  The patient  reports that she quit smoking about 15 years  ago. Her smoking use included Cigarettes. She has a 54 pack-year smoking history. She has never used smokeless tobacco. She reports that she does not drink alcohol or use illicit drugs.   Family History:  The patient's family history includes Cancer in her father; Heart disease in her mother.    ROS:  Please see the history of present illness.   Otherwise, review of systems are positive for significant back pain and pain all over from arthritis.  All other systems are reviewed and negative.    PHYSICAL EXAM: VS:  BP 132/94 mmHg  Pulse 80  Ht 5' 3"  (1.6 m)  Wt 158 lb 1.9 oz (71.723 kg)  BMI 28.02 kg/m2 , BMI Body mass index is 28.02 kg/(m^2). GEN: Well nourished, well developed, in no acute distress HEENT: normal Neck: no JVD, carotid bruits, or masses Cardiac:RRR; 3/6 systolic murmur LSB, no rubs, or gallops,no edema  Respiratory:  clear to auscultation bilaterally, normal work of breathing GI: soft, nontender, nondistended, + BS MS: no deformity or atrophy Skin: warm and dry, no rash Neuro:  Strength and sensation are intact Psych: euthymic mood, full affect   EKG:  EKG is not ordered today.    Recent Labs: 12/19/2013: BUN 21; Creatinine 1.4*; Potassium 3.9; Sodium 139 06/22/2014: Hemoglobin 9.2*; Platelets 237    Lipid Panel    Component Value Date/Time   CHOL 157 12/07/2013 1458   TRIG 244.0* 12/07/2013 1458   HDL 37.80* 12/07/2013 1458   CHOLHDL 4 12/07/2013 1458   VLDL 48.8* 12/07/2013 1458   LDLCALC 70 12/07/2013 1458   LDLDIRECT 124.2 12/09/2012 0824      Wt Readings from Last 3 Encounters:  10/30/14 158 lb 1.9 oz (71.723 kg)  06/07/14 165 lb 6.4 oz (75.025 kg)  06/01/14 168 lb (76.204 kg)     2Decho 2011 Study Conclusions   - Left ventricle: The cavity size was normal. There was moderate    focal basal hypertrophy. Systolic function was normal. The    estimated ejection fraction was in the range of 55% to 60%. There    was no dynamic obstruction. Wall  motion was normal; there were no    regional wall motion abnormalities. Doppler parameters are    consistent with abnormal left ventricular relaxation (grade 1    diastolic dysfunction).  - Aortic valve: Mildly elevated velocity through AV likely from    basal septal hypertrophy and flow acceleration. No SAM or evidence    of HOCM Valve area: 2.68cm^2(VTI). Valve area: 2.75cm^2 (Vmax).  - Mitral valve: Mildly calcified annulus. Mild regurgitation.  - Left atrium: The atrium was mildly dilated.  - Pulmonary arteries: Systolic pressure was mildly increased    Signed,  Ermalinda Barrios, PA-C  10/30/2014 1:25 PM    [facility] [place], [place], [place]  [address] Phone: [phone]; Fax: [phone]

## 2014-10-30 NOTE — Assessment & Plan Note (Signed)
Based on last echo, may be due to turbulence across the LV outflow tract although no significant gradient was noted.

## 2014-10-30 NOTE — Patient Instructions (Addendum)
Your physician recommends that you continue on your current medications as directed. Please refer to the Current Medication list given to you today.  Your physician recommends that you have your  FASTING lipid profile drawn by your primary care office in April 2016. Please call our office to schedule your lab appointment if you are unable to have them drawn there.  Your physician wants you to follow-up in: 1 year with Dr.McLean You will receive a reminder letter in the mail two months in advance. If you don't receive a letter, please call our office to schedule the follow-up appointment.

## 2014-11-08 ENCOUNTER — Other Ambulatory Visit: Payer: Self-pay

## 2014-11-08 MED ORDER — AMLODIPINE BESYLATE 10 MG PO TABS: 10.0000 mg | ORAL_TABLET | Freq: Every evening | ORAL | Status: DC

## 2014-11-14 ENCOUNTER — Telehealth: Payer: Self-pay | Admitting: Pharmacist

## 2014-11-14 NOTE — Telephone Encounter (Signed)
Pt cannot come to lab/Coumadin clinic tomorrow b/c no ride. Resched to 11/24/14. Kennith Center, Pharm.D., CPP 11/14/2014@10 :13 AM

## 2014-11-15 ENCOUNTER — Ambulatory Visit: Payer: Commercial Managed Care - HMO

## 2014-11-15 ENCOUNTER — Other Ambulatory Visit: Payer: Medicare HMO

## 2014-11-17 ENCOUNTER — Other Ambulatory Visit: Payer: Self-pay | Admitting: Nurse Practitioner

## 2014-11-24 ENCOUNTER — Ambulatory Visit (HOSPITAL_BASED_OUTPATIENT_CLINIC_OR_DEPARTMENT_OTHER): Payer: Medicare HMO | Admitting: Pharmacist

## 2014-11-24 ENCOUNTER — Other Ambulatory Visit (HOSPITAL_BASED_OUTPATIENT_CLINIC_OR_DEPARTMENT_OTHER): Payer: Medicare HMO

## 2014-11-24 DIAGNOSIS — D689 Coagulation defect, unspecified: Secondary | ICD-10-CM

## 2014-11-24 LAB — PROTIME-INR
INR: 3.8 — ABNORMAL HIGH (ref 2.00–3.50)
PROTIME: 45.6 s — AB (ref 10.6–13.4)

## 2014-11-24 LAB — POCT INR: INR: 3.8

## 2014-11-24 NOTE — Progress Notes (Signed)
INR = 3.8 on Coumadin 5 mg daily except 2.5 mg on Mon/Wed/Sat. She has had no bleeding/bruising. Med changes: off Iron & protonix. INR is a little high today but was at goal last visit.   Continue same dose of Coumadin and recheck on 12/13/14 when back for routine labs.  If her INR is high again then, we will decrease her Coumadin dose. Kennith Center, Pharm.D., CPP 11/24/2014@2 :20 PM

## 2014-12-07 ENCOUNTER — Ambulatory Visit: Payer: Commercial Managed Care - HMO | Admitting: Cardiology

## 2014-12-13 ENCOUNTER — Telehealth: Payer: Self-pay | Admitting: Oncology

## 2014-12-13 ENCOUNTER — Ambulatory Visit (HOSPITAL_BASED_OUTPATIENT_CLINIC_OR_DEPARTMENT_OTHER): Payer: Medicare HMO | Admitting: Pharmacist

## 2014-12-13 ENCOUNTER — Other Ambulatory Visit (HOSPITAL_BASED_OUTPATIENT_CLINIC_OR_DEPARTMENT_OTHER): Payer: Medicare HMO

## 2014-12-13 DIAGNOSIS — D689 Coagulation defect, unspecified: Secondary | ICD-10-CM

## 2014-12-13 LAB — PROTIME-INR
INR: 3.9 — ABNORMAL HIGH (ref 2.00–3.50)
Protime: 46.8 Seconds — ABNORMAL HIGH (ref 10.6–13.4)

## 2014-12-13 LAB — POCT INR: INR: 3.9

## 2014-12-13 NOTE — Patient Instructions (Signed)
Decreasing her weekly dose by 2.5 mg.  Take Coumadin 2.5mg  daily except 5 mg Tue, Thur and Sun.  Recheck INR on 01/17/15; lab at 1:45 pm and Coumadin clinic at 2 pm

## 2014-12-13 NOTE — Telephone Encounter (Signed)
per Kolleen to sch pt CC-pt aware °

## 2014-12-13 NOTE — Progress Notes (Signed)
Pt seen in clinic today INR=3.9 Pt has no changes to report Her goal is 2-3.5 She has been on the same dose a few visits with little change in INR. Remains supratherapeutic Decreasing her weekly dose by 2.5 mg.   Take Coumadin 2.5mg  daily except 5 mg Tue, Thur and Sun.  Recheck INR on 01/17/15; lab at 1:45 pm and Coumadin clinic at 2 pm  Will also request to change lab appmt on 5.25 to coincide with her 5.23 appmt with Dr. Jana Hakim.

## 2015-01-05 ENCOUNTER — Ambulatory Visit (INDEPENDENT_AMBULATORY_CARE_PROVIDER_SITE_OTHER): Payer: Medicare HMO | Admitting: Internal Medicine

## 2015-01-05 ENCOUNTER — Encounter: Payer: Self-pay | Admitting: Internal Medicine

## 2015-01-05 ENCOUNTER — Other Ambulatory Visit: Payer: Self-pay | Admitting: Cardiology

## 2015-01-05 VITALS — BP 144/86 | HR 91 | Ht 63.0 in | Wt 153.2 lb

## 2015-01-05 DIAGNOSIS — J449 Chronic obstructive pulmonary disease, unspecified: Secondary | ICD-10-CM | POA: Diagnosis not present

## 2015-01-05 NOTE — Progress Notes (Signed)
Subjective:    Patient ID: Karen Chambers, female    DOB: 06-Feb-1946, 69 y.o.   MRN: [id number]  HPI   #obese female,    Body mass index is 27.14 kg/(m^2). 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   #Gold Stage 2 copd (followed by Dr. Annamaria Boots 2005-2006 and Dr. Chase Caller Jan 2012 - date). - MM genotype  - Baseline PFt Aug 2005 - fev1 1.4L/61%, DLCO 74%.   - Class 2 dyspnea with baseline desaturation to 86% walking 185 feet in feb 2012; uses innogen system  - PFTS today 03/03/2011 - FEv1 1.2L/57%, no bd respoinse. dLCO 14.5/60%. This is a 200cc drop in 7 yeears . Rx with spiriva (changed from symbicort) - April 2015:    - Pt was 79% on 2L pulsed upon entering exam room. On 3l/m pulse 90% . We discussed turning her oxygen up to 3 L pulse seen with walking. In order to keep oxygen saturations above 90%   #AECOPD  - 01/09/14 -> aug/pred in office  /////////////////////////////////////////////////////////////   OV 01/05/2015  Chief Complaint  Patient presents with  . Follow-up    Pt states breathing has stayed the same. Still uses Spiriva and O2 as needed. Denies any chest tightness, wheezing, and SOB     Followup moderate COPD - last seen Sept 2015. This is routine followupo  -  Currently she is stable without any problems. Denies shortness of breath and cough. She is compliant with Spiriva. Stopped using day time o2 because of > 30# intentional weight loss and feeling great. She has not objectively figured out if she needs o2. Decision to not use o2 has been subjective.Walking desaturation test today in office 185 feet x 3 laps on RA using walker and forehead probe: walked only 2 laps  and stopped due to back-ache (nowaday back is the limiting factor)  Past medical history reviewed: She does not take chronic prednisone anymore for her gout and rheumatoid arthritis/.  She has lost lot of weight intentionally. BMI now down to 27-28   CAT COPD Symptom and Quality of Life Score ([employer])  02/10/2012  11/08/2012  08/29/2013   Never Cough -> Cough all the time 2 2 0  No phlegm in chest -> Chest is full of phlegm 2 1 1   No chest tightness -> Chest feels very tight 0 0 0  No dyspnea for 1 flight stairs/hill -> Very dyspneic for 1 flight of stairs 4 5 3   No limitations for ADL at home -> Very limited with ADL at home 3 1 2   Confident leaving home -> Not at all confident leaving home 0 0 0  Sleep soundly -> Do not sleep soundly because of lung condition 2 0 0  Lots of Energy -> No energy at all 2 2 2   TOTAL Score (max 40)  15 11 8          has a past medical history of Other and unspecified hyperlipidemia (401.9); Hypothyroidism; Obesity, unspecified; Gout; Low back pain; Rheumatoid arthritis(714.0); Cough (11/12/2010); Hypertension; Coagulopathy; COPD (chronic obstructive pulmonary disease); Complication of anesthesia; and Von Willebrand disease.  reports that she quit smoking about 15 years ago. Her smoking use included Cigarettes. She has a 54 pack-year smoking history. She has never used smokeless tobacco.  Past Surgical History  Procedure Laterality Date  . Cholecystectomy    . Total abdominal hysterectomy    . Stapedectomy Bilateral     right ear hearing aid  . Esophagogastroduodenoscopy (egd) with propofol N/A 06/23/2014    Procedure: ESOPHAGOGASTRODUODENOSCOPY (EGD) WITH PROPOFOL;  Surgeon: Jerene Bears, MD;  Location: [facility];  Service: Gastroenterology;  Laterality: N/A;  . Colonoscopy with propofol N/A 06/23/2014    Procedure: COLONOSCOPY WITH PROPOFOL;  Surgeon: Jerene Bears, MD;  Location: [facility];  Service: Gastroenterology;   Laterality: N/A;    Allergies  Allergen Reactions  . Allopurinol Itching, Nausea Only, Rash and Other (See Comments)    Top of head to tip of toes with rash; aches all over  . Cephalexin Other (See Comments)    Severe case of thrush in mouth/tongue.  Tongue raw and hard and swollen.  . Nitrofurantoin Hives, Itching, Nausea And Vomiting, Rash and Other (See Comments)    Body aches, like I have the flu  . Ace Inhibitors Other (See Comments)    cough  . Sulfonamide Derivatives Hives, Itching and Rash    Immunization History  Administered Date(s) Administered  . Influenza Split 05/28/2012, 07/23/2013, 06/22/2014  . Influenza Whole 06/24/2010, 06/23/2011  . Pneumococcal Conjugate-13 06/01/2014  . Pneumococcal Polysaccharide-23 02/10/2012    Family History  Problem Relation Age of Onset  . Cancer Father   . Heart disease Mother      Current outpatient prescriptions:  .  acetaminophen (TYLENOL ARTHRITIS PAIN) 650 MG CR tablet, Take 650-1,300 mg by mouth 2 (two) times daily., Disp: , Rfl:  .  amLODipine (NORVASC) 10 MG tablet, Take 1 tablet (10 mg total) by mouth every evening., Disp: 90 tablet, Rfl: 4 .  aspirin 81 MG tablet, Take 81 mg by mouth at bedtime. , Disp: , Rfl:  .  atorvastatin (LIPITOR) 20 MG tablet, TAKE 1 TABLET EVERY DAY, Disp: 30 tablet, Rfl: 0 .  febuxostat (ULORIC) 40 MG tablet, Take 40 mg by mouth every morning. , Disp: , Rfl:  .  leflunomide (ARAVA) 20 MG tablet, Take 20 mg by mouth every morning. , Disp: , Rfl:  .  levothyroxine (SYNTHROID, LEVOTHROID) 100 MCG tablet, Take 100 mcg by mouth daily before breakfast., Disp: , Rfl:  .  losartan (COZAAR) 100 MG tablet, Take 100 mg by mouth every evening., Disp: , Rfl:  .  metoprolol succinate (TOPROL-XL) 100 MG 24 hr tablet, Take 100 mg by mouth 2 (two) times daily. Take with or immediately following a meal., Disp: , Rfl:  .  Multiple Vitamin (MULTIVITAMIN) capsule, Take 1 capsule by mouth daily at 12 noon. , Disp: ,  Rfl:  .  Omega 3 1200 MG CAPS, Take 1 capsule by mouth daily at 12 noon. , Disp: , Rfl:  .  potassium chloride SA (K-DUR,KLOR-CON) 20 MEQ tablet, Take 20 mEq by mouth at bedtime., Disp: , Rfl:  .  SPIRIVA HANDIHALER 18 MCG inhalation capsule, INHALE ONE CAPSULE ONCE DAILY, Disp: 30 capsule, Rfl: 2 .  tiotropium (SPIRIVA) 18 MCG inhalation capsule, Place 1 capsule (18 mcg total) into inhaler and inhale every morning., Disp: 30 capsule, Rfl: 5 .  traMADol (ULTRAM) 50 MG tablet, Take 100 mg by mouth 2 (two) times daily. , Disp: , Rfl:  .  warfarin (COUMADIN) 5 MG tablet,  Take 2.5-5 mg by mouth daily. Takes one tablet (5 mg) Monday, Tues, Thurs, Fri, & sun. Takes half a tablet (2.5 mg) on Wed & Saturday., Disp: , Rfl:      Review of Systems  Constitutional: Negative for fever and unexpected weight change.  HENT: Negative for congestion, dental problem, ear pain, mouth sores, nosebleeds, postnasal drip, rhinorrhea, sinus pressure, sneezing, sore throat and trouble swallowing.   Eyes: Negative for redness and itching.  Respiratory: Negative for cough, chest tightness, shortness of breath and wheezing.   Cardiovascular: Negative for palpitations and leg swelling.  Genitourinary: Negative for dysuria.  Neurological: Negative for headaches.  Psychiatric/Behavioral: Negative for dysphoric mood.       Objective:   Physical Exam  Filed Vitals:   01/05/15 1546  BP: 144/86  Pulse: 91  Height: 5\' 3"  (1.6 m)  Weight: 153 lb 3.2 oz (69.491 kg)  SpO2: 96%   Estimated body mass index is 27.14 kg/(m^2) as calculated from the following:   Height as of this encounter: 5\' 3"  (1.6 m).   Weight as of this encounter: 153 lb 3.2 oz (69.491 kg).   GEN: A/Ox3; pleasant , NAD, elderly , chronically ill appearing but looks much healther after significant weight loss  HEENT:  [place]/AT,  EACs-clear, TMs-wnl, NOSE-clear, THROAT-clear, no lesions, no postnasal drip or exudate noted.   NECK:  Supple w/ fair ROM;  no JVD; normal carotid impulses w/o bruits; no thyromegaly or nodules palpated; no lymphadenopathy.  RESP  Few rhonchi and exp wheezes .no accessory muscle use, no dullness to percussion  CARD:  RRR, no m/r/g  , no peripheral edema, pulses intact, no cyanosis or clubbing.  GI:   Soft & nt; nml bowel sounds; no organomegaly or masses detected.  Musco: RA deformities +.  USing walker  Neuro: alert, no focal deficits noted.    Skin: Warm, no lesions or rashes          Assessment & Plan:     ICD-9-CM ICD-10-CM   1. Moderate COPD (chronic obstructive pulmonary disease) 496 J44.9    Congratulations on weight loss COPD is stable Continue spiriva one puff daily Continue albuterol as needed You do not need daytime o2 Continue o2 at night as before  Followup  8 months or sooner if needed   Dr. Brand Males, M.D., [facility].C.P Pulmonary and Critical Care Medicine Staff Physician [facility] Pager: [phone], If no answer or between  15:00h - 7:00h: call [phone]  01/05/2015 4:17 PM

## 2015-01-05 NOTE — Patient Instructions (Addendum)
ICD-9-CM ICD-10-CM   1. Moderate COPD (chronic obstructive pulmonary disease) 496 J44.9    Congratulations on weight loss COPD is stable Continue spiriva one puff daily Continue albuterol as needed You do not need daytime o2 Continue o2 at night as before  Followup  8 months or sooner if needed

## 2015-01-17 ENCOUNTER — Other Ambulatory Visit (HOSPITAL_BASED_OUTPATIENT_CLINIC_OR_DEPARTMENT_OTHER): Payer: Medicare HMO

## 2015-01-17 ENCOUNTER — Other Ambulatory Visit: Payer: Medicare HMO

## 2015-01-17 ENCOUNTER — Ambulatory Visit (HOSPITAL_BASED_OUTPATIENT_CLINIC_OR_DEPARTMENT_OTHER): Payer: Medicare HMO | Admitting: Pharmacist

## 2015-01-17 DIAGNOSIS — D689 Coagulation defect, unspecified: Secondary | ICD-10-CM

## 2015-01-17 LAB — PROTIME-INR
INR: 4 — ABNORMAL HIGH (ref 2.00–3.50)
PROTIME: 48 s — AB (ref 10.6–13.4)

## 2015-01-17 LAB — POCT INR: INR: 4

## 2015-01-17 NOTE — Progress Notes (Signed)
INR continues to hover around 4 despite coumadin dose decreased last visit.  Goal INR=2-3.5.  No bleeding/unusual bruising.  No medication changes.  Will hold coumadin today and further decrease to 5mg  Tues/Thurs and 2.5mg  other days.  This is this lowest coumadin dose that Karen Chambers has been on since we have seen her in coumadin clinic.  Will check PT/INR 4 weeks with her yearly visit with Dr Jana Hakim.  Karen Chambers will call if she has any problems with her coumadin prior to this.

## 2015-01-29 ENCOUNTER — Other Ambulatory Visit: Payer: Self-pay | Admitting: *Deleted

## 2015-01-29 ENCOUNTER — Telehealth: Payer: Self-pay | Admitting: *Deleted

## 2015-01-29 MED ORDER — ATORVASTATIN CALCIUM 20 MG PO TABS: 20.0000 mg | ORAL_TABLET | Freq: Every day | ORAL | Status: DC

## 2015-01-29 NOTE — Telephone Encounter (Signed)
Patient called for a refill on atorvastatin. I didn't see a recent lipid panel and per last ov she would have this drawn by another provider. She states that she has not seen her pcp in a while and her rheumatologist usually does her blood work. She does not see him for another few months but is willing to come here to have this drawn. Please advise. Thanks, MI

## 2015-01-30 NOTE — Telephone Encounter (Signed)
Pt scheduled for fasting lipid profile 02/16/15

## 2015-01-30 NOTE — Telephone Encounter (Signed)
LMTCB

## 2015-02-12 ENCOUNTER — Telehealth: Payer: Self-pay | Admitting: Oncology

## 2015-02-12 ENCOUNTER — Other Ambulatory Visit: Payer: Self-pay | Admitting: *Deleted

## 2015-02-12 ENCOUNTER — Ambulatory Visit (HOSPITAL_BASED_OUTPATIENT_CLINIC_OR_DEPARTMENT_OTHER): Payer: Medicare HMO | Admitting: Oncology

## 2015-02-12 ENCOUNTER — Other Ambulatory Visit: Payer: Medicare HMO

## 2015-02-12 ENCOUNTER — Other Ambulatory Visit (HOSPITAL_BASED_OUTPATIENT_CLINIC_OR_DEPARTMENT_OTHER): Payer: Medicare HMO

## 2015-02-12 ENCOUNTER — Ambulatory Visit: Payer: Medicare HMO | Admitting: Pharmacist

## 2015-02-12 VITALS — BP 181/94 | HR 95 | Temp 97.6°F | Resp 18 | Ht 63.0 in | Wt 149.7 lb

## 2015-02-12 DIAGNOSIS — D6861 Antiphospholipid syndrome: Secondary | ICD-10-CM

## 2015-02-12 DIAGNOSIS — D689 Coagulation defect, unspecified: Secondary | ICD-10-CM | POA: Diagnosis not present

## 2015-02-12 DIAGNOSIS — D509 Iron deficiency anemia, unspecified: Secondary | ICD-10-CM | POA: Diagnosis not present

## 2015-02-12 DIAGNOSIS — M069 Rheumatoid arthritis, unspecified: Secondary | ICD-10-CM | POA: Diagnosis not present

## 2015-02-12 LAB — POCT INR: INR: 3.4

## 2015-02-12 LAB — PROTIME-INR
INR: 3.4 (ref 2.00–3.50)
PROTIME: 40.8 s — AB (ref 10.6–13.4)

## 2015-02-12 MED ORDER — WARFARIN SODIUM 5 MG PO TABS: 2.5000 mg | ORAL_TABLET | Freq: Every day | ORAL | Status: DC

## 2015-02-12 NOTE — Progress Notes (Signed)
Pt seen prior to MD appmt INR=3.4 Goal 2-3.5 No new meds Diet consistent No s/s bleeding in last month  Continue Coumadin 2.5 mg daily and 5mg  on Tues and Thurs.   Recheck INR on 03/14/15; lab at 1:45 pm and Coumadin clinic at 2:00 pm

## 2015-02-12 NOTE — Patient Instructions (Signed)
Continue Coumadin 2.5 mg daily and 5mg  on Tues and Thurs.   Recheck INR on 03/14/15; lab at 1:45 pm and Coumadin clinic at 2:00 pm

## 2015-02-12 NOTE — Telephone Encounter (Signed)
Gave avs & calendar for June thru July.

## 2015-02-12 NOTE — Progress Notes (Signed)
ID: Karen Chambers   DOB: 1946/01/13  MR#: [id number]  CSN#:[id number]  PCP: Aura Dials, PA-C GYN: SU: OTHER MD: Hurley Cisco, Loralie Champagne, Clyda Greener Pyrtle  CHIEF COMPLAINT: Coagulopathy  CURRENT  TREATMENT: Coumadin  INTERVAL HISTORY: Karen Chambers returns today for followup of her coagulopathy accompanied by Wille Glaser. Since her last visit here lab work at Dr. Elmon Else office showed her to be iron deficient. She proceeded to colonoscopy 06/23/2014, with upper endoscopy. She had mild diverticulosis and 3 sessile polyps were removed, with no evidence of high-grade dysplasia or malignancy. Gastric biopsy showed minimal chronic inflammation, no Helicobacter ([id number]).  REVIEW OF SYSTEMS: Karen Chambers has had no overt bleeding. She uses a walker. Her main form of exercise is walking around the house. She has significant pain in her back and knees. Her gout is well-controlled. She reports no flares from her rheumatoid arthritis. She denies palpitations, dizziness, or shortness of breath but then she is not particularly active. A detailed review of systems today was otherwise stable  PAST MEDICAL HISTORY: Past Medical History  Diagnosis Date  . Other and unspecified hyperlipidemia 401.9  . Hypothyroidism   . Obesity, unspecified   . Gout   . Low back pain     buldging disc ,and herniated disc  . Rheumatoid arthritis(714.0)   . Cough 11/12/2010  . Hypertension   . Coagulopathy   . COPD (chronic obstructive pulmonary disease)   . Complication of anesthesia     slow awaken from anesthesia  . Von Willebrand disease     Dr. Jana Hakim follows    PAST SURGICAL HISTORY: Past Surgical History  Procedure Laterality Date  . Cholecystectomy    . Total abdominal hysterectomy    . Stapedectomy Bilateral     right ear hearing aid  . Esophagogastroduodenoscopy (egd) with propofol N/A 06/23/2014    Procedure: ESOPHAGOGASTRODUODENOSCOPY (EGD) WITH PROPOFOL;  Surgeon: Jerene Bears, MD;   Location: [facility];  Service: Gastroenterology;  Laterality: N/A;  . Colonoscopy with propofol N/A 06/23/2014    Procedure: COLONOSCOPY WITH PROPOFOL;  Surgeon: Jerene Bears, MD;  Location: [facility];  Service: Gastroenterology;  Laterality: N/A;    FAMILY HISTORY Family History  Problem Relation Age of Onset  . Cancer Father   . Heart disease Mother     HEALTH MAINTENANCE: History  Substance Use Topics  . Smoking status: Former Smoker -- 1.50 packs/day for 36 years    Types: Cigarettes    Quit date: 07/05/1999  . Smokeless tobacco: Never Used     Comment: 1ppd x 36 years  . Alcohol Use: No     Colonoscopy: oct 2015, o be repeated October 2018  PAP: n/a  Mammogram: UTD/SOLIS  Lipid panel:   Allergies  Allergen Reactions  . Allopurinol Itching, Nausea Only, Rash and Other (See Comments)    Top of head to tip of toes with rash; aches all over  . Cephalexin Other (See Comments)    Severe case of thrush in mouth/tongue.  Tongue raw and hard and swollen.  . Nitrofurantoin Hives, Itching, Nausea And Vomiting, Rash and Other (See Comments)    Body aches, like I have the flu  . Ace Inhibitors Other (See Comments)    cough  . Sulfonamide Derivatives Hives, Itching and Rash    Current Outpatient Prescriptions  Medication Sig Dispense Refill  . acetaminophen (TYLENOL ARTHRITIS PAIN) 650 MG CR tablet Take 650-1,300 mg by mouth 2 (two) times daily.    Marland Kitchen amLODipine (NORVASC) 10  MG tablet Take 1 tablet (10 mg total) by mouth every evening. 90 tablet 4  . aspirin 81 MG tablet Take 81 mg by mouth at bedtime.     Marland Kitchen atorvastatin (LIPITOR) 20 MG tablet Take 1 tablet (20 mg total) by mouth daily. 90 tablet 0  . febuxostat (ULORIC) 40 MG tablet Take 40 mg by mouth every morning.     . leflunomide (ARAVA) 20 MG tablet Take 20 mg by mouth every morning.     Marland Kitchen levothyroxine (SYNTHROID, LEVOTHROID) 100 MCG tablet Take 100 mcg by mouth daily before breakfast.    . losartan (COZAAR) 100  MG tablet Take 100 mg by mouth every evening.    . metoprolol succinate (TOPROL-XL) 100 MG 24 hr tablet Take 100 mg by mouth 2 (two) times daily. Take with or immediately following a meal.    . Multiple Vitamin (MULTIVITAMIN) capsule Take 1 capsule by mouth daily at 12 noon.     . Omega 3 1200 MG CAPS Take 1 capsule by mouth daily at 12 noon.     . potassium chloride SA (K-DUR,KLOR-CON) 20 MEQ tablet Take 20 mEq by mouth at bedtime.    Marland Kitchen SPIRIVA HANDIHALER 18 MCG inhalation capsule INHALE ONE CAPSULE ONCE DAILY 30 capsule 2  . tiotropium (SPIRIVA) 18 MCG inhalation capsule Place 1 capsule (18 mcg total) into inhaler and inhale every morning. 30 capsule 5  . traMADol (ULTRAM) 50 MG tablet Take 100 mg by mouth 2 (two) times daily.     Marland Kitchen warfarin (COUMADIN) 5 MG tablet Take 2.5-5 mg by mouth daily. Takes one tablet (5 mg) Monday, Tues, Thurs, Fri, & sun. Takes half a tablet (2.5 mg) on Wed & Saturday.     No current facility-administered medications for this visit.    OBJECTIVE: Middle-aged white woman who ambulates with a walker  Filed Vitals:   02/12/15 1326  BP: 181/94  Pulse: 95  Temp: 97.6 F (36.4 C)  Resp: 18     Body mass index is 26.52 kg/(m^2).    ECOG FS: 2  Sclerae unicteric, pupils round and equal Oropharynx clear and moist-- no thrush or other lesions No cervical or supraclavicular adenopathy Lungs no rales or rhonchi Heart regular rate and rhythm; 3/6 systolic murmur as previously noted Abd soft, obese, nontender, positive bowel sounds MSK no focal spinal tenderness, no upper extremity lymphedema Neuro: nonfocal, well oriented, appropriate affect Breasts: Deferred   LAB RESULTS: Lab Results  Component Value Date   WBC 10.5* 06/22/2014   NEUTROABS 7.9* 06/22/2014   HGB 9.2* 06/22/2014   HCT 30.0* 06/22/2014   MCV 77.5* 06/22/2014   PLT 237 06/22/2014      Chemistry      Component Value Date/Time   NA 139 12/19/2013 1400   K 3.9 12/19/2013 1400   CL 106  12/19/2013 1400   CO2 26 12/19/2013 1400   BUN 21 12/19/2013 1400   CREATININE 1.4* 12/19/2013 1400      Component Value Date/Time   CALCIUM 9.0 12/19/2013 1400       No results found for: LABCA2  No components found for: LABCA125   Recent Labs Lab 02/12/15 1311  INR 3.4    Urinalysis No results found for: COLORURINE  STUDIES: No results found.   ASSESSMENT: 69 y.o.  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     PLAN:  Evalyn continues to be very stable as far as her coagulopathy is concerned, with very steady IMR's and no evidence of overt bleeding.  She likely has occult bleeding somewhere, not noted by recent GI workup. She did not tolerate oral iron supplementation. I suggested she received Feraheme here, which I think would boost her hemoglobin to a little over 11, and she will "think about it".  Otherwise we are continuing to check her INR once a month and I will see her again in one year. She knows to call for any problems that may develop before the next visit here.  MAGRINAT,GUSTAV C    02/12/2015

## 2015-02-14 ENCOUNTER — Other Ambulatory Visit: Payer: Medicare HMO

## 2015-02-16 ENCOUNTER — Other Ambulatory Visit (INDEPENDENT_AMBULATORY_CARE_PROVIDER_SITE_OTHER): Payer: Medicare HMO | Admitting: *Deleted

## 2015-02-16 DIAGNOSIS — E785 Hyperlipidemia, unspecified: Secondary | ICD-10-CM

## 2015-02-16 LAB — LDL CHOLESTEROL, DIRECT: Direct LDL: 55 mg/dL

## 2015-02-16 LAB — LIPID PANEL
CHOLESTEROL: 149 mg/dL (ref 0–200)
HDL: 43.1 mg/dL (ref >39.00)
NonHDL: 105.9
Total CHOL/HDL Ratio: 3
Triglycerides: 265 mg/dL — ABNORMAL HIGH (ref 0.0–149.0)
VLDL: 53 mg/dL — ABNORMAL HIGH (ref 0.0–40.0)

## 2015-02-20 ENCOUNTER — Other Ambulatory Visit: Payer: Self-pay | Admitting: Cardiology

## 2015-02-21 ENCOUNTER — Other Ambulatory Visit: Payer: Self-pay

## 2015-02-21 MED ORDER — METOPROLOL SUCCINATE ER 100 MG PO TB24: 100.0000 mg | ORAL_TABLET | Freq: Two times a day (BID) | ORAL | Status: DC

## 2015-02-28 ENCOUNTER — Telehealth: Payer: Self-pay | Admitting: *Deleted

## 2015-02-28 NOTE — Telephone Encounter (Signed)
-----   Message from Imogene Burn, PA-C sent at 02/20/2015  7:41 AM EDT ----- Triglycerides very high. Try fish oil 15 g/day. Repeat fasting lipid panel in 6 months.

## 2015-03-08 ENCOUNTER — Other Ambulatory Visit: Payer: Self-pay | Admitting: Cardiology

## 2015-03-08 ENCOUNTER — Other Ambulatory Visit: Payer: Self-pay | Admitting: Oncology

## 2015-03-14 ENCOUNTER — Ambulatory Visit (HOSPITAL_BASED_OUTPATIENT_CLINIC_OR_DEPARTMENT_OTHER): Payer: Medicare HMO | Admitting: Pharmacist

## 2015-03-14 ENCOUNTER — Other Ambulatory Visit: Payer: Medicare HMO

## 2015-03-14 DIAGNOSIS — D6861 Antiphospholipid syndrome: Secondary | ICD-10-CM

## 2015-03-14 DIAGNOSIS — D689 Coagulation defect, unspecified: Secondary | ICD-10-CM

## 2015-03-14 LAB — PROTIME-INR
INR: 2.3 (ref 2.00–3.50)
Protime: 27.6 Seconds — ABNORMAL HIGH (ref 10.6–13.4)

## 2015-03-14 LAB — POCT INR: INR: 2.3

## 2015-03-14 NOTE — Patient Instructions (Signed)
Continue Coumadin 2.5 mg daily except 5mg  on Tues and Thurs.   Recheck INR on 04/18/15; lab at 1:45 pm and Coumadin clinic at 2:00 pm.

## 2015-03-14 NOTE — Progress Notes (Signed)
INR is within goal today. No missed or extra coumadin doses. No changes in diet. Her fish oil dose will be increased to 1500mg  to begin in the next few days. No unusual bruising. No bleeding noted. No s/s of clotting noted. Continue Coumadin 2.5 mg daily except 5mg  on Tues and Thurs.   Recheck INR on 04/18/15; lab at 1:45 pm and Coumadin clinic at 2:00 pm.

## 2015-04-18 ENCOUNTER — Ambulatory Visit (HOSPITAL_BASED_OUTPATIENT_CLINIC_OR_DEPARTMENT_OTHER): Payer: Medicare HMO | Admitting: Pharmacist

## 2015-04-18 ENCOUNTER — Other Ambulatory Visit (HOSPITAL_BASED_OUTPATIENT_CLINIC_OR_DEPARTMENT_OTHER): Payer: Medicare HMO

## 2015-04-18 DIAGNOSIS — D6861 Antiphospholipid syndrome: Secondary | ICD-10-CM

## 2015-04-18 DIAGNOSIS — D689 Coagulation defect, unspecified: Secondary | ICD-10-CM

## 2015-04-18 LAB — POCT INR: INR: 1.4

## 2015-04-18 LAB — PROTIME-INR
INR: 1.4 — AB (ref 2.00–3.50)
Protime: 16.8 Seconds — ABNORMAL HIGH (ref 10.6–13.4)

## 2015-04-18 NOTE — Patient Instructions (Signed)
Increase Coumadin to 2.5 mg daily except 5mg  on Tues, Wed and Thurs.   Recheck INR on 05/16/15; lab at 1:45 pm and Coumadin clinic at 2:00 pm

## 2015-04-18 NOTE — Progress Notes (Signed)
Pt seen in clinic today INR=1.4 She has had quite a bit of fluctuation in INR with little change in dose Will only increase weekly dose by 2.5 mg and recheck her in a month with her lab draws.  She has been eating more greens lately since her garden came in. She added more Omega 3 with lutein per her eye MD and she stopped Arava (too expensive) Not sure any of these are a result of her 1.4 INR.    Increase Coumadin to 2.5 mg daily except 5mg  on Tues, Wed and Thurs.   Recheck INR on 05/16/15; lab at 1:45 pm and Coumadin clinic at 2:00 pm

## 2015-04-23 ENCOUNTER — Other Ambulatory Visit: Payer: Self-pay | Admitting: Cardiology

## 2015-04-23 ENCOUNTER — Telehealth: Payer: Self-pay | Admitting: Internal Medicine

## 2015-04-23 MED ORDER — TIOTROPIUM BROMIDE MONOHYDRATE 18 MCG IN CAPS: ORAL_CAPSULE | RESPIRATORY_TRACT | Status: AC

## 2015-04-23 NOTE — Telephone Encounter (Signed)
Spoke with the pt and verified her request  Rx for spiriva was refilled  Nothing further needed

## 2015-05-16 ENCOUNTER — Ambulatory Visit (HOSPITAL_BASED_OUTPATIENT_CLINIC_OR_DEPARTMENT_OTHER): Payer: Medicare HMO | Admitting: Pharmacist

## 2015-05-16 ENCOUNTER — Other Ambulatory Visit (HOSPITAL_BASED_OUTPATIENT_CLINIC_OR_DEPARTMENT_OTHER): Payer: Medicare HMO

## 2015-05-16 DIAGNOSIS — D689 Coagulation defect, unspecified: Secondary | ICD-10-CM

## 2015-05-16 DIAGNOSIS — D6861 Antiphospholipid syndrome: Secondary | ICD-10-CM | POA: Diagnosis not present

## 2015-05-16 LAB — PROTIME-INR
INR: 1.3 — AB (ref 2.00–3.50)
Protime: 15.6 Seconds — ABNORMAL HIGH (ref 10.6–13.4)

## 2015-05-16 LAB — POCT INR: INR: 1.3

## 2015-05-16 NOTE — Patient Instructions (Signed)
Take 10 mg (2 tabs ) today then take Coumadin to 2.5 mg on Mon, Wed and Fri and 5mg  other days of the week.   Recheck INR on 06/13/15; lab at 1:45 pm and Dr. Jana Hakim or his nurse will call you with the results and instructions.

## 2015-05-16 NOTE — Progress Notes (Signed)
Pt seen in clinic today INR=1.3 Continues to be subtherapeutic after increase in dose No changes to report either Diet consistent Take 10 mg (2 tabs ) today then take Coumadin to 2.5 mg on Mon, Wed and Fri and 5mg  other days of the week.   Recheck INR on 06/13/15; lab at 1:45 pm and Dr. Jana Hakim or his nurse will call you with the results and instructions.  Pt rec'd letter and is aware this is her last visit with the CC She will come in for her already scheduled monthly lab visit and expects a call from Dr. Jana Hakim or his desk nurse. Instructed patient to call his nurse if she does not hear within 24 hrs

## 2015-05-18 ENCOUNTER — Other Ambulatory Visit: Payer: Self-pay | Admitting: *Deleted

## 2015-06-12 ENCOUNTER — Telehealth: Payer: Self-pay | Admitting: Oncology

## 2015-06-12 NOTE — Telephone Encounter (Signed)
Returned Advertising account executive. Patient moved labs from 09/21 to 09/28.

## 2015-06-13 ENCOUNTER — Ambulatory Visit: Payer: Medicare HMO

## 2015-06-16 ENCOUNTER — Other Ambulatory Visit: Payer: Self-pay | Admitting: Oncology

## 2015-06-20 ENCOUNTER — Other Ambulatory Visit (HOSPITAL_BASED_OUTPATIENT_CLINIC_OR_DEPARTMENT_OTHER): Payer: Medicare HMO

## 2015-06-20 DIAGNOSIS — D6861 Antiphospholipid syndrome: Secondary | ICD-10-CM

## 2015-06-20 DIAGNOSIS — D689 Coagulation defect, unspecified: Secondary | ICD-10-CM | POA: Diagnosis not present

## 2015-06-20 LAB — PROTIME-INR
INR: 3 (ref 2.00–3.50)
Protime: 36 Seconds — ABNORMAL HIGH (ref 10.6–13.4)

## 2015-06-21 ENCOUNTER — Telehealth: Payer: Self-pay

## 2015-06-21 NOTE — Telephone Encounter (Signed)
Patient's INR is 3.0 today.  Patient states that she is taking 2.5 mg Monday & Fridays; 5 mg all other days.  According to pharmacy note from 05/16/15 patient is suppose to be taking 2.5 mg on Mon, Wed and Fri and 5mg  other days of the week. Since patient is therapeutic, Probation officer encouraged her to stay on the same dose she is presently taking and that is written on her coumadin instruction sheet from pharmacy.  Patient states understanding.  Patient to be retested on 07/11/15.

## 2015-07-06 ENCOUNTER — Encounter: Payer: Self-pay | Admitting: Pulmonary Disease

## 2015-07-06 ENCOUNTER — Ambulatory Visit (INDEPENDENT_AMBULATORY_CARE_PROVIDER_SITE_OTHER): Payer: Medicare HMO | Admitting: Pulmonary Disease

## 2015-07-06 ENCOUNTER — Telehealth: Payer: Self-pay | Admitting: Pulmonary Disease

## 2015-07-06 ENCOUNTER — Ambulatory Visit (INDEPENDENT_AMBULATORY_CARE_PROVIDER_SITE_OTHER)
Admission: RE | Admit: 2015-07-06 | Discharge: 2015-07-06 | Disposition: A | Discharge status: Home / Self Care | Payer: Medicare HMO | Source: Ambulatory Visit | Attending: Pulmonary Disease | Admitting: Pulmonary Disease

## 2015-07-06 VITALS — BP 118/64 | HR 94 | Ht 62.0 in | Wt 158.2 lb

## 2015-07-06 DIAGNOSIS — Z23 Encounter for immunization: Secondary | ICD-10-CM

## 2015-07-06 DIAGNOSIS — J441 Chronic obstructive pulmonary disease with (acute) exacerbation: Secondary | ICD-10-CM | POA: Diagnosis not present

## 2015-07-06 MED ORDER — PREDNISONE 10 MG PO TABS: 20.0000 mg | ORAL_TABLET | Freq: Every day | ORAL | Status: AC

## 2015-07-06 MED ORDER — PREDNISONE 10 MG PO TABS: 20.0000 mg | ORAL_TABLET | Freq: Every day | ORAL | Status: DC

## 2015-07-06 NOTE — Progress Notes (Signed)
Subjective:    Patient ID: Karen Chambers, female    DOB: [birth year], 69 y.o.   MRN: [id number]  Synopsis: This is a patient followed by Dr. Chase Chambers for COPD, he has summarized her clinical situation as follows: #obese female,   Body mass index is 27.14 kg/(m^2). 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   #Gold Stage 2 copd (followed by Dr. Annamaria Chambers 2005-2006 and Dr. Chase Chambers Jan 2012 - date). - MM genotype - Baseline PFt Aug 2005 - fev1 1.4L/61%, DLCO 74%.  - Class 2 dyspnea with baseline desaturation to 86% walking 185 feet in feb 2012; uses innogen system - PFTS today 03/03/2011 - FEv1 1.2L/57%, no bd respoinse. dLCO 14.5/60%. This is a 200cc drop in 7 yeears . Rx with spiriva (changed from symbicort) - April 2015:  - Pt was 79% on 2L pulsed upon entering exam room. On 3l/m pulse 90% . We discussed turning her oxygen up to 3 L pulse seen with walking. In order to keep oxygen saturations above 90%  HPI Chief Complaint  Patient presents with  . Acute Visit    pt. states she is still on 3L o2 on night. she has increased SOB. had a sore throat but now is better.  no wheezing. no chest pain. occ. tightness. occ. cough.   Karen Chambers says that she has been ill for two days. She says that this episode is similar to prior episodes of bronchitis or pneumonia. She started with sore throat, sinus drainage and what she thinks are allergy. No wheeze right now.  But when she gets up and walks fast she gets out of breath and tired. No chest pain or leg swelling. Some cough, some chest congesiton. No childhood asthma.  Past Medical History  Diagnosis Date  . Other and  unspecified hyperlipidemia 401.9  . Hypothyroidism   . Obesity, unspecified   . Gout   . Low back pain     buldging disc ,and herniated disc  . Rheumatoid arthritis(714.0)   . Cough 11/12/2010  . Hypertension   . Coagulopathy ([place])   . COPD (chronic obstructive pulmonary disease) ([place])   . Complication of anesthesia     slow awaken from anesthesia  . Von Willebrand disease ([place])     Dr. Jana Chambers follows      Review of Systems  Constitutional: Positive for fatigue. Negative for fever and chills.  HENT: Positive for postnasal drip, rhinorrhea and sinus pressure.   Respiratory: Positive for cough and shortness of breath. Negative for wheezing.   Cardiovascular: Negative for chest pain, palpitations and leg swelling.       Objective:   Physical Exam Filed Vitals:   07/06/15 0927  BP: 118/64  Pulse: 94  Height: 5\' 2"  (1.575 m)  Weight: 158 lb 3.2 oz (71.759 kg)  SpO2: 92%  3L [place]  Gen: well appearing HENT: OP clear, TM's clear, neck supple PULM: Mild wheezing on R, normal effort CV: RRR, no mgr, trace edema GI: BS+, soft, nontender Derm: no cyanosis or rash Psyche: normal mood and affect  Notes from my partner were reviewed where she was cared for for COPD in April  and was stable at that point April 2015 chest x-ray with findings consistent with COPD      Assessment & Plan:  COPD exacerbation ([place]) Karen Chambers is experiencing a COPD exacerbation, albeit her symptoms are not severe. She does have mild wheezing on exam and considering the recent viral illness her symptoms are most consistent with a COPD exacerbation.  Plan: Prednisone 20 mg daily for 5 days Chest x-ray Call us if no improvement Continue Spiriva Continue oxygen as prescribed Follow-up with Dr. Chase Chambers as previously scheduled     Current outpatient prescriptions:  .  acetaminophen (TYLENOL ARTHRITIS PAIN) 650 MG CR tablet, Take 650-1,300 mg by mouth 2 (two) times daily., Disp: , Rfl:  .   amLODipine (NORVASC) 10 MG tablet, Take 1 tablet (10 mg total) by mouth every evening., Disp: 90 tablet, Rfl: 4 .  aspirin 81 MG tablet, Take 81 mg by mouth at bedtime. , Disp: , Rfl:  .  atorvastatin (LIPITOR) 20 MG tablet, TAKE 1 TABLET DAILY., Disp: 90 tablet, Rfl: 1 .  febuxostat (ULORIC) 40 MG tablet, Take 40 mg by mouth every morning. , Disp: , Rfl:  .  levothyroxine (SYNTHROID, LEVOTHROID) 100 MCG tablet, TAKE 1 TABLET DAILY, Disp: 90 tablet, Rfl: 3 .  losartan (COZAAR) 100 MG tablet, TAKE 1 TABLET EVERY DAY, Disp: 90 tablet, Rfl: 2 .  metoprolol succinate (TOPROL-XL) 100 MG 24 hr tablet, Take 1 tablet (100 mg total) by mouth 2 (two) times daily. Take with or immediately following a meal., Disp: 180 tablet, Rfl: 3 .  Multiple Vitamin (MULTIVITAMIN) capsule, Take 1 capsule by mouth daily at 12 noon. , Disp: , Rfl:  .  Omega 3 1200 MG CAPS, Take 1,500 mg by mouth daily at 12 noon. 1200mg  softgel + 300mg  softgel., Disp: , Rfl:  .  potassium chloride SA (K-DUR,KLOR-CON) 20 MEQ tablet, TAKE 1 TABLET (20 MEQ TOTAL) EVERY DAY, Disp: 90 tablet, Rfl: 1 .  tiotropium (SPIRIVA HANDIHALER) 18 MCG inhalation capsule, INHALE ONE CAPSULE ONCE DAILY, Disp: 30 capsule, Rfl: 6 .  tiotropium (SPIRIVA) 18 MCG inhalation capsule, Place 1 capsule (18 mcg total) into inhaler and inhale every morning., Disp: 30 capsule, Rfl: 5 .  traMADol (ULTRAM) 50 MG tablet, Take 100 mg by mouth 2 (two) times daily. , Disp: , Rfl:  .  warfarin (COUMADIN) 5 MG tablet, TAKE 1 TABLET DAILY OR AS DIRECTED BY THE COUMADIN CLINIC, Disp: 105 tablet, Rfl: 3 .  predniSONE (DELTASONE) 10 MG tablet, Take 2 tablets (20 mg total) by mouth daily with breakfast., Disp: 10 tablet, Rfl: 0

## 2015-07-06 NOTE — Patient Instructions (Signed)
Take the prednisone as prescribed, 20 mg daily for 5 days We will call you with the results of the chest x-ray Follow-up with Dr. Chase Caller as previously scheduled or sooner if he do not improve

## 2015-07-06 NOTE — Telephone Encounter (Signed)
RX was sent to [facility] Called [employer] to cancel RX but was advised they have not received it on there end yet I have resent RX into [facility]. Called pt and made aware. She is going to call [employer] tomorrow to check on this. Nothing further needed

## 2015-07-06 NOTE — Assessment & Plan Note (Signed)
Karen Chambers is experiencing a COPD exacerbation, albeit her symptoms are not severe. She does have mild wheezing on exam and considering the recent viral illness her symptoms are most consistent with a COPD exacerbation.  Plan: Prednisone 20 mg daily for 5 days Chest x-ray Call us if no improvement Continue Spiriva Continue oxygen as prescribed Follow-up with Dr. Chase Caller as previously scheduled

## 2015-07-09 ENCOUNTER — Telehealth: Payer: Self-pay | Admitting: *Deleted

## 2015-07-09 NOTE — Telephone Encounter (Signed)
Come back tomorrow or Wednesday to see Karen Chambers if no improvement by tomorrow.  May need lasix and or a pro-BNP checked.

## 2015-07-09 NOTE — Progress Notes (Signed)
Quick Note:  Spoke with pt. See phone msg from 07/09/15 for additional information. ______

## 2015-07-09 NOTE — Telephone Encounter (Signed)
Spoke with pt.  Discussed below per BQ.  Pt scheduled to see TP tomorrow at 10 am.  Pt confirmed appt and voiced no further questions or concerns at this time.

## 2015-07-09 NOTE — Telephone Encounter (Signed)
Spoke with pt.  Pt states she felt better and SOB improved some on Saturday.  However, yesterday and today, there's no real change since being seen on Friday.  States SOB is still present and has chest tightness off and on.  Denies cough, wheezing, or f/c/s.  Has 3 days left of prednisone. Dr. Lake Bells, please advise if you have any further recs.  Thank you!

## 2015-07-09 NOTE — Telephone Encounter (Signed)
-----   Message from Juanito Doom, MD sent at 07/09/2015  7:28 AM EDT ----- C, Please call her to check on her.  The radiologist felt that she may have had some fluid in her chest. Thanks B

## 2015-07-10 ENCOUNTER — Other Ambulatory Visit: Payer: Self-pay

## 2015-07-10 ENCOUNTER — Encounter: Payer: Self-pay | Admitting: Adult Health

## 2015-07-10 ENCOUNTER — Ambulatory Visit (INDEPENDENT_AMBULATORY_CARE_PROVIDER_SITE_OTHER): Payer: Medicare HMO | Admitting: Adult Health

## 2015-07-10 ENCOUNTER — Ambulatory Visit (INDEPENDENT_AMBULATORY_CARE_PROVIDER_SITE_OTHER)
Admission: RE | Admit: 2015-07-10 | Discharge: 2015-07-10 | Disposition: A | Discharge status: Home / Self Care | Payer: Medicare HMO | Source: Ambulatory Visit | Attending: Adult Health | Admitting: Adult Health

## 2015-07-10 ENCOUNTER — Other Ambulatory Visit (INDEPENDENT_AMBULATORY_CARE_PROVIDER_SITE_OTHER): Payer: Medicare HMO

## 2015-07-10 VITALS — BP 148/80 | HR 91 | Temp 98.1°F | Ht 62.0 in | Wt 156.8 lb

## 2015-07-10 DIAGNOSIS — I509 Heart failure, unspecified: Secondary | ICD-10-CM | POA: Diagnosis not present

## 2015-07-10 DIAGNOSIS — J449 Chronic obstructive pulmonary disease, unspecified: Secondary | ICD-10-CM

## 2015-07-10 DIAGNOSIS — R0600 Dyspnea, unspecified: Secondary | ICD-10-CM | POA: Diagnosis not present

## 2015-07-10 DIAGNOSIS — D689 Coagulation defect, unspecified: Secondary | ICD-10-CM

## 2015-07-10 LAB — BASIC METABOLIC PANEL
BUN: 27 mg/dL — AB (ref 6–23)
CO2: 29 mEq/L (ref 19–32)
Calcium: 9.8 mg/dL (ref 8.4–10.5)
Chloride: 105 mEq/L (ref 96–112)
Creatinine, Ser: 1.55 mg/dL — ABNORMAL HIGH (ref 0.40–1.20)
GFR: 35.05 mL/min — AB (ref >60.00)
Glucose, Bld: 106 mg/dL — ABNORMAL HIGH (ref 70–99)
POTASSIUM: 4.5 meq/L (ref 3.5–5.1)
Sodium: 139 mEq/L (ref 135–145)

## 2015-07-10 LAB — BRAIN NATRIURETIC PEPTIDE: PRO B NATRI PEPTIDE: 1262 pg/mL — AB (ref 0.0–100.0)

## 2015-07-10 MED ORDER — FUROSEMIDE 20 MG PO TABS: 20.0000 mg | ORAL_TABLET | Freq: Every day | ORAL | Status: DC | PRN

## 2015-07-10 NOTE — Assessment & Plan Note (Signed)
Recent flare with suspect volume overload with edema on CXR  Will check bnp today and repeat CXR  Challenge with gentle diuresis with lasix 20mg  x 3 days.  If elevated consider echo   Plan  Lasix 20mg  daily for 3 days, then As needed  Daily for swelling.  Check labs and chest xray today .  Continue on current regimen.  follow up Dr. Chase Caller in 2 months and As needed   Please contact office for sooner follow up if symptoms do not improve or worsen or seek emergency care

## 2015-07-10 NOTE — Progress Notes (Signed)
Subjective:    Patient ID: Karen Chambers, female    DOB: October 24, 1945, 69 y.o.   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 Annamaria Boots 2005-2006 and Dr. Chase Caller Jan 2012 - date). - MM genotype - Baseline PFt Aug 2005 - fev1 1.4L/61%, DLCO 74%.  - Class 2 dyspnea with baseline desaturation to 86% walking 185 feet in feb 2012; uses innogen system - PFTS today 03/03/2011 - FEv1 1.2L/57%, no bd respoinse. dLCO 14.5/60%. This is a 200cc drop in 7 yeears .  - April 2015:  - Pt was 79% on 2L pulsed upon entering exam room. On 3l/m pulse 90% . We discussed turning her oxygen up to 3 L pulse seen with walking. In order to keep oxygen saturations above 90%    07/10/2015 Follow up : COPD flare  Pt returns for persistent symptoms . She was seen 4 days ago for COPD flare with URI .  tx w/ prednisone 20mg  x 5 days. CXR showed CHF with pulmonary edema.  She feels better but still has DOE, sore throat, chest tightness.  No leg swelling , no orthopnea. She denies chest pain, syncope or palpitations.  Leaving to go out of town on vacation in few days.      Review of Systems Constitutional:   No  weight loss, night sweats,  Fevers, chills, fatigue, or  lassitude.  HEENT:   No headaches,  Difficulty swallowing,  Tooth/dental problems, or  Sore throat,                No sneezing, itching, ear ache,  +nasal congestion, post nasal drip,   CV:  No chest pain,  Orthopnea, PND, swelling in lower extremities, anasarca, dizziness, palpitations, syncope.   GI  No heartburn, indigestion, abdominal pain, nausea, vomiting, diarrhea, change in bowel habits, loss of appetite, bloody stools.   Resp:   No chest wall deformity  Skin: no rash or lesions.  GU: no  dysuria, change in color of urine, no urgency or frequency.  No flank pain, no hematuria   MS:  No joint pain or swelling.  No decreased range of motion.  No back pain.  Psych:  No change in mood or affect. No depression or anxiety.  No memory loss.         Objective:   Physical Exam  GEN: A/Ox3; pleasant , NAD   HEENT:  [place]/AT,  EACs-clear, TMs-wnl, NOSE-clear, THROAT-clear, no lesions, no postnasal drip or exudate noted.   NECK:  Supple w/ fair ROM; no JVD; normal carotid impulses w/o bruits; no thyromegaly or nodules palpated; no lymphadenopathy.  RESP  Decreased BS in bases  w/o, wheezes/ rales/ or rhonchi.no accessory muscle use, no dullness to percussion  CARD:  RRR, no m/r/g  , no peripheral edema, pulses intact, no cyanosis or clubbing.  GI:   Soft & nt; nml bowel sounds; no organomegaly or masses detected.  Musco: Warm bil, no deformities or joint swelling noted.   Neuro: alert, no focal deficits noted.    Skin: Warm, no lesions or rashes   CXR : 07/06/15 reviewed independetly  Congestive heart failure with pulmonary interstitial edema and bilateral effusions     Assessment &  Plan:

## 2015-07-10 NOTE — Patient Instructions (Signed)
Lasix 20mg  daily for 3 days, then As needed  Daily for swelling.  Check labs and chest xray today .  Continue on current regimen.  follow up Dr. Chase Caller in 2 months and As needed   Please contact office for sooner follow up if symptoms do not improve or worsen or seek emergency care

## 2015-07-10 NOTE — Addendum Note (Signed)
Addended by: Mathis Bud on: 07/10/2015 10:45 AM   Modules accepted: Orders

## 2015-07-10 NOTE — Assessment & Plan Note (Signed)
?   Volume overload  Check BNP and repeat cxr  Lasix x 3 d

## 2015-07-11 ENCOUNTER — Other Ambulatory Visit: Payer: Self-pay

## 2015-07-11 ENCOUNTER — Other Ambulatory Visit (HOSPITAL_BASED_OUTPATIENT_CLINIC_OR_DEPARTMENT_OTHER): Payer: Medicare HMO

## 2015-07-11 ENCOUNTER — Other Ambulatory Visit: Payer: Self-pay | Admitting: Adult Health

## 2015-07-11 ENCOUNTER — Telehealth: Payer: Self-pay

## 2015-07-11 DIAGNOSIS — D6861 Antiphospholipid syndrome: Secondary | ICD-10-CM

## 2015-07-11 DIAGNOSIS — I509 Heart failure, unspecified: Secondary | ICD-10-CM

## 2015-07-11 DIAGNOSIS — D689 Coagulation defect, unspecified: Secondary | ICD-10-CM

## 2015-07-11 LAB — PROTIME-INR
INR: 3 (ref 2.00–3.50)
PROTIME: 36 s — AB (ref 10.6–13.4)

## 2015-07-11 NOTE — Progress Notes (Signed)
Quick Note:  Called and spoke with pt. Reviewed results and recs. Scheduled pt for ov with TP on 07/26/15 @ 2:15. Order will be placed in phone message for 2 D echo. Pt voiced understanding and had no further questions. ______

## 2015-07-11 NOTE — Telephone Encounter (Signed)
Called patient with INR results and LVM asking patient to call back. INR Result Today : 3.0 Present Dosing: 2.5 mg Monday and Friday, 5 mg all other days PATIENT IS REMAIN ON THE SAME DOSING. Patient to repeat lab in one month.

## 2015-07-13 ENCOUNTER — Telehealth: Payer: Self-pay | Admitting: *Deleted

## 2015-07-13 NOTE — Telephone Encounter (Signed)
Lujean Amel, RN at 07/11/2015 3:34 PM     Status: Signed       Expand All Collapse All   Called patient with INR results and LVM asking patient to call back. INR Result Today : 3.0 Present Dosing: 2.5 mg Monday and Friday, 5 mg all other days PATIENT IS REMAIN ON THE SAME DOSING. Patient to repeat lab in one month.       Above information provided to patient

## 2015-07-19 ENCOUNTER — Other Ambulatory Visit (HOSPITAL_COMMUNITY): Payer: Medicare HMO

## 2015-07-24 ENCOUNTER — Other Ambulatory Visit (HOSPITAL_COMMUNITY): Payer: Medicare HMO

## 2015-07-26 ENCOUNTER — Ambulatory Visit: Payer: Medicare HMO | Admitting: Adult Health

## 2015-07-30 ENCOUNTER — Ambulatory Visit (HOSPITAL_COMMUNITY): Payer: Medicare HMO | Attending: Cardiology

## 2015-07-30 ENCOUNTER — Other Ambulatory Visit: Payer: Self-pay

## 2015-07-30 ENCOUNTER — Telehealth: Payer: Self-pay | Admitting: Adult Health

## 2015-07-30 DIAGNOSIS — E785 Hyperlipidemia, unspecified: Secondary | ICD-10-CM | POA: Diagnosis not present

## 2015-07-30 DIAGNOSIS — I350 Nonrheumatic aortic (valve) stenosis: Secondary | ICD-10-CM | POA: Insufficient documentation

## 2015-07-30 DIAGNOSIS — I059 Rheumatic mitral valve disease, unspecified: Secondary | ICD-10-CM | POA: Diagnosis not present

## 2015-07-30 DIAGNOSIS — I10 Essential (primary) hypertension: Secondary | ICD-10-CM | POA: Diagnosis not present

## 2015-07-30 DIAGNOSIS — I050 Rheumatic mitral stenosis: Secondary | ICD-10-CM | POA: Insufficient documentation

## 2015-07-30 DIAGNOSIS — I509 Heart failure, unspecified: Secondary | ICD-10-CM | POA: Insufficient documentation

## 2015-07-30 DIAGNOSIS — F17200 Nicotine dependence, unspecified, uncomplicated: Secondary | ICD-10-CM | POA: Insufficient documentation

## 2015-07-30 DIAGNOSIS — I517 Cardiomegaly: Secondary | ICD-10-CM | POA: Insufficient documentation

## 2015-07-30 DIAGNOSIS — I5189 Other ill-defined heart diseases: Secondary | ICD-10-CM | POA: Diagnosis not present

## 2015-07-30 DIAGNOSIS — I340 Nonrheumatic mitral (valve) insufficiency: Secondary | ICD-10-CM | POA: Insufficient documentation

## 2015-07-30 NOTE — Telephone Encounter (Signed)
Per TP on 07/29/14 after reviewing Echo Result Note     Echo shows severe AV stenosis     Gr 2 DD .     Needs OV with cards -ASAP .     Followed by Dr. Aundra Dubin .     Pt aware , awaiting referral    Called and spoke to the pt. Informed her that I was placing order for a referral to cardiology. She asked if she needed to keep her ov on 08/07/15 with TP. I explained to her that I would discuss the ov with TP and return her call with TP's recommendation. Pt voiced understanding and had no further questions.

## 2015-07-30 NOTE — Progress Notes (Signed)
Quick Note:  Called and spoke with the pt. She verified that she did review the results and recs with TP. I informed her that I was placing the order for the referral to cardiology. She asked if she should keep her ov with TP on 08/07/15. I explained to her I would discuss the ov with TP and call her back with the recommendation. Pt voiced understanding and had no further questions. See phone note on 07/30/15 for further detail. ______

## 2015-07-30 NOTE — Telephone Encounter (Signed)
Per TP  Okay to cancel ov on 08/06/14 @ 3pm with TP And reschedule ov with MR (first available)  LVM on house phone for pt to return call. Called pt's cell and spoke to a gentleman that stated she would not be home till after five and asked if I could call back in the morning. I explained to him that are phones turn off at five and that I would call back tomorrow morning.  Will return call in am on 07/31/15.

## 2015-08-01 NOTE — Progress Notes (Addendum)
Cardiology Office Note   Date:  08/02/2015   ID:  Karen Chambers, DOB 1945-12-19, MRN [id number]  PCP:  Karen Dials, PA-C  Cardiologist:  Dr. Aundra Dubin   Abnormal 2D ECHO - severe AS.   History of Present Illness: Karen Chambers is a 69 y.o. 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   She was last seen by Dr. Aundra Dubin in 11/2013 and Karen Husk PA-C in 10/2014. Felt to be stable from a cardiac standpoint. A murmur was noted both office visits and felt to be due to turbulence across LV outflow tract with no significant gradient noted. She was recently seen by Karen Serve NP who follows her COPD for what she thought was bronchitis. She had what was felt to be in a COPD and CHF exacerbation: CXR 07/06/15 w/  with pulmonary interstitial edema and bilateral effusions. BNP elevated to 1262. She was started on Lasix 20mg  x 3 days. 2D ECHO was ordered. This revealed EF 60-65%, G2DD, and severe AS and she was referred back to cardiology for follow up.   Today she presents to clinic for evaluation. She reports a little tightness in her chest mostly in the morning when she gets up. It radiates across her chest and resolves after sitting for a little bit. She has chronic SOB due to COPD which has not changed recently. She does have some DOE. She is not very active due to knee arthritis. Walks with a walker but she is able to do what she wants to do. Her main problems are her knees and her back. She does take lasix PRN (~4 x a week) for chest tightness and it seems to help. She reports having a murmur for as long as she can remember. Her sisters also have heart murmurs   Past Medical History:  1. Hyperlipidemia  2. HTN: renal artery ultrasound (10/11) with no renal artery stenosis. ACEI cough.  3. Hypothyroidism  4. History of  UTIs  5. 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 Followed by Dr. Jana Chambers. On permanent warfarin. Goal INR has been 2.5-3.5. 6. Obesity  7. Gout: questionable. Uric acid normal when checked in 10/11.  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 Echo (8/11): moderate focal basal septal hypertrophy, EF 55-60%, no LV outflow tract gradient, grade I diastolic dysfunction, no regional wall motion abnormalities, no systolic anteiror motion of the mitral valve, PA systolic pressure 30 mmHg.  10. Low back pain  11. Rheumatoid arthritis on pred and MTX in past  12. COPD on home oxygen: Gold Stage 2 copd (followed by Dr. Annamaria Boots 2005-2006 and Dr. Chase Caller Jan 2012 - date). - MM genotype 13. RBBB  Family History:  Mother with "enlarged heart," probably died of MI at age 23. Father passed away young from cancer.   Social History:  Worked in accounts payable department, now retired. Prior smoker, quit 2000. Separated from husband. Has children.   Past Medical History  Diagnosis Date  . Other and unspecified hyperlipidemia 401.9  . Hypothyroidism   . Obesity, unspecified   . Gout   . Low back pain     buldging disc ,and herniated disc  . Rheumatoid arthritis(714.0)   . Cough 11/12/2010  . Hypertension   . Coagulopathy ([place])   . COPD (chronic obstructive pulmonary  disease) ([place])   . Complication of anesthesia     slow awaken from anesthesia  . Von Willebrand disease ([place])     Dr. Jana Chambers follows    Past Surgical History  Procedure Laterality Date  . Cholecystectomy    . Total abdominal hysterectomy    . Stapedectomy Bilateral     right ear hearing aid  . Esophagogastroduodenoscopy (egd) with propofol N/A 06/23/2014    Procedure: ESOPHAGOGASTRODUODENOSCOPY (EGD) WITH PROPOFOL;  Surgeon: Karen Bears, MD;  Location: [facility];  Service: Gastroenterology;  Laterality: N/A;  . Colonoscopy with propofol N/A 06/23/2014     Procedure: COLONOSCOPY WITH PROPOFOL;  Surgeon: Karen Bears, MD;  Location: [facility];  Service: Gastroenterology;  Laterality: N/A;     Current Outpatient Prescriptions  Medication Sig Dispense Refill  . acetaminophen (TYLENOL ARTHRITIS PAIN) 650 MG CR tablet Take 650-1,300 mg by mouth 2 (two) times daily.    Marland Kitchen amLODipine (NORVASC) 10 MG tablet Take 1 tablet (10 mg total) by mouth every evening. 90 tablet 4  . aspirin 81 MG tablet Take 81 mg by mouth at bedtime.     Marland Kitchen atorvastatin (LIPITOR) 20 MG tablet TAKE 1 TABLET DAILY. 90 tablet 1  . febuxostat (ULORIC) 40 MG tablet Take 40 mg by mouth every morning.     . furosemide (LASIX) 20 MG tablet Take 1 tablet (20 mg total) by mouth daily as needed for edema. 30 tablet 0  . levothyroxine (SYNTHROID, LEVOTHROID) 100 MCG tablet TAKE 1 TABLET DAILY 90 tablet 3  . losartan (COZAAR) 100 MG tablet TAKE 1 TABLET EVERY DAY 90 tablet 2  . metoprolol succinate (TOPROL-XL) 100 MG 24 hr tablet Take 1 tablet (100 mg total) by mouth 2 (two) times daily. Take with or immediately following a meal. 180 tablet 3  . Multiple Vitamin (MULTIVITAMIN) capsule Take 1 capsule by mouth daily at 12 noon.     . Omega 3 1200 MG CAPS Take 1,500 mg by mouth daily at 12 noon. 1200mg  softgel + 300mg  softgel.    . potassium chloride SA (K-DUR,KLOR-CON) 20 MEQ tablet TAKE 1 TABLET (20 MEQ TOTAL) EVERY DAY 90 tablet 1  . tiotropium (SPIRIVA HANDIHALER) 18 MCG inhalation capsule INHALE ONE CAPSULE ONCE DAILY 30 capsule 6  . traMADol (ULTRAM) 50 MG tablet Take 100 mg by mouth 2 (two) times daily.     Marland Kitchen warfarin (COUMADIN) 5 MG tablet TAKE 1 TABLET DAILY OR AS DIRECTED BY THE COUMADIN CLINIC 105 tablet 3  . enoxaparin (LOVENOX) 100 MG/ML injection Inject 1 mL (100 mg total) into the skin daily. 10 Syringe 1   No current facility-administered medications for this visit.    Allergies:   Allopurinol; Cephalexin; Nitrofurantoin; Ace inhibitors; and Sulfonamide derivatives     Social History:  The patient  reports that she quit smoking about 16 years ago. Her smoking use included Cigarettes. She has a 54 pack-year smoking history. She has never used smokeless tobacco. She reports that she does not drink alcohol or use illicit drugs.   Family History:  The patient's family history includes Cancer in her father; Heart attack in her mother; Heart disease in her mother; Hypertension in her sister. There is no history of Stroke.    ROS:  Please see the history of present illness.   Otherwise, review of systems are positive for none.   All other systems are reviewed and negative.    PHYSICAL EXAM: VS:  BP 145/55 mmHg  Pulse 86  Ht 5\' 2"  (1.575 m)  Wt 154 lb (69.854 kg)  BMI 28.16 kg/m2 , BMI Body mass index is 28.16 kg/(m^2). GEN: Well nourished, well developed, in no acute distress HEENT: normal Neck: no JVD, carotid bruits, or masses Cardiac: RRR; no murmurs, rubs, or gallops,no edema . SEM @ RUSB Respiratory:  clear to auscultation bilaterally, normal work of breathing.  GI: soft, nontender, nondistended, + BS MS: no deformity or atrophy Skin: warm and dry, no rash Neuro:  Strength and sensation are intact Psych: euthymic mood, full affect   EKG:  EKG is ordered today. The ekg ordered today demonstrates NSR with PVC. RBBB.   Recent Labs: 07/10/2015: BUN 27*; Creatinine, Ser 1.55*; Potassium 4.5; Pro B Natriuretic peptide (BNP) 1262.0*; Sodium 139    Lipid Panel    Component Value Date/Time   CHOL 149 02/16/2015 0827   TRIG 265.0* 02/16/2015 0827   HDL 43.10 02/16/2015 0827   CHOLHDL 3 02/16/2015 0827   VLDL 53.0* 02/16/2015 0827   LDLCALC 70 12/07/2013 1458   LDLDIRECT 55.0 02/16/2015 0827      Wt Readings from Last 3 Encounters:  08/02/15 154 lb (69.854 kg)  07/10/15 156 lb 12.8 oz (71.124 kg)  07/06/15 158 lb 3.2 oz (71.759 kg)      Other studies Reviewed: Additional studies/ records that were reviewed today include: 2D  ECHO Review of the above records demonstrates:  2D ECHO 07/30/2015 LV EF: 60- 65% Study Conclusions - Left ventricle: The cavity size was normal. Wall thickness was increased in a pattern of mild LVH. Systolic function was normal. The estimated ejection fraction was in the range of 60% to 65%. Wall motion was normal; there were no regional wall motion abnormalities. Features are consistent with a pseudonormal left ventricular filling pattern, with concomitant abnormal relaxation and increased filling pressure (grade 2 diastolic dysfunction). - Aortic valve: Trileaflet; severely calcified leaflets. There was severe stenosis. Mean gradient (S): 58 mm Hg. Valve area (VTI): 1.15 cm^2. Valve area (Vmax): 0.94 cm^2. - Mitral valve: Moderately to severely calcified annulus. Moderately calcified leaflets . The findings are consistent with mild stenosis. There was trivial regurgitation. Mean gradient (D): 10 mm Hg. Valve area by pressure half-time: 2.61 cm^2. - Left atrium: The atrium was moderately dilated. - Right ventricle: The cavity size was normal. Systolic function was normal. - Pulmonary arteries: No complete TR doppler jet so unable to estimate PA systolic pressure. - Inferior vena cava: The vessel was normal in size. The respirophasic diameter changes were in the normal range (= 50%), consistent with normal central venous pressure. Impressions: - Normal LV size with mild LV hypertrophy. EF 60-65%. Moderate diastolic dysfunction. Normal RV size and systolic function. Severe aortic stenosis by mean gradient, AVA 1.1 cm^2 by VTI, 0.9 cm^2 by Vmax. I think that the AS is severe. The mitral valve and annulus are heavily calcified. Mean gradient across the valve is 10 mmHg but MVA by PHT is 2.6 cm^2. I do not think that the mitral stenosis is more than mild.   2Decho 2011 Study Conclusions - Left ventricle: The cavity size was normal.  There was moderate  focal basal hypertrophy. Systolic function was normal. The  estimated ejection fraction was in the range of 55% to 60%. There  was no dynamic obstruction. Wall motion was normal; there were no  regional wall motion abnormalities. Doppler parameters are  consistent with abnormal left ventricular relaxation (grade 1  diastolic dysfunction). - Aortic valve: Mildly elevated velocity through AV  likely from  basal septal hypertrophy and flow acceleration. No SAM or evidence  of HOCM Valve area: 2.68cm^2(VTI). Valve area: 2.75cm^2 (Vmax). - Mitral valve: Mildly calcified annulus. Mild regurgitation. - Left atrium: The atrium was mildly dilated. - Pulmonary arteries: Systolic pressure was mildly increased    ASSESSMENT AND PLAN:  Karen Chambers is a 69 y.o. 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   Severe aortic stenosis- Trileaflet; severely calcified leaflets. Mean gradient (S): 58 mm Hg. Valve area (VTI): 1.15 cm^2. Valve area (Vmax): 0.94 cm^2.Marland Kitchen -- She will need L/RHC. I have set this up for next Thursday 08/09/15 with Dr. Aundra Dubin. May be AVR vs TAVR candidate. Dr. Aundra Dubin to decide next step in management after [facility]. We will get the appropriate lab work today  Chronic diastolic CHF -- Appears euvolemic on exam  -- Continue lasix 20mg  po qd PRN  HTN- BP 145/55. Continue amlodipine 10mg , losartan 100mg  and Toprol XL 100mg  BID. She will hold ARB before cath.   CKD- creat around 1.55 (as above will hold ARB the day before the cath- she takes lasix PRN)  COPD- continue home 02. Followed by pulmonology   HLD- continue statin  Antiphospholipid antibody syndrome- on chronic coumadin. We will have to bridge her before [facility]. She will see Elberta Leatherwood Pharm D in the coumadin clinic today  for help in bridging.   RA  Current medicines are reviewed at length with the patient today.  The patient does not have concerns regarding medicines.  The following changes have been made:  no change  Labs/ tests ordered today include:    Orders Placed This Encounter  Procedures  . Basic Metabolic Panel (BMET)  . CBC w/Diff  . INR/PT  . EKG 12-Lead     Disposition:   FU with Dr. Aundra Dubin - follow up will depend on findings of [facility]  Signed, Eileen Stanford, PA-C  08/02/2015 [facility] [place], [place],   [address] Phone: [phone]; Fax: [phone]

## 2015-08-01 NOTE — Telephone Encounter (Signed)
Spoke with pt, aware of tp's recs.  Ov with tp cx'ed, pt already has ov scheduled with MR on 09/07/15.  appt will be kept.  Nothing further needed

## 2015-08-02 ENCOUNTER — Ambulatory Visit (INDEPENDENT_AMBULATORY_CARE_PROVIDER_SITE_OTHER): Payer: Medicare HMO | Admitting: Physician Assistant

## 2015-08-02 ENCOUNTER — Ambulatory Visit (INDEPENDENT_AMBULATORY_CARE_PROVIDER_SITE_OTHER): Payer: Medicare HMO | Admitting: *Deleted

## 2015-08-02 ENCOUNTER — Telehealth: Payer: Self-pay

## 2015-08-02 ENCOUNTER — Encounter: Payer: Self-pay | Admitting: Physician Assistant

## 2015-08-02 ENCOUNTER — Telehealth: Payer: Self-pay | Admitting: Cardiology

## 2015-08-02 ENCOUNTER — Ambulatory Visit: Payer: Medicare HMO | Admitting: Adult Health

## 2015-08-02 VITALS — BP 145/55 | HR 86 | Ht 62.0 in | Wt 154.0 lb

## 2015-08-02 DIAGNOSIS — I350 Nonrheumatic aortic (valve) stenosis: Secondary | ICD-10-CM | POA: Diagnosis not present

## 2015-08-02 DIAGNOSIS — I10 Essential (primary) hypertension: Secondary | ICD-10-CM | POA: Diagnosis not present

## 2015-08-02 DIAGNOSIS — D689 Coagulation defect, unspecified: Secondary | ICD-10-CM | POA: Diagnosis not present

## 2015-08-02 DIAGNOSIS — R0600 Dyspnea, unspecified: Secondary | ICD-10-CM

## 2015-08-02 DIAGNOSIS — J441 Chronic obstructive pulmonary disease with (acute) exacerbation: Secondary | ICD-10-CM

## 2015-08-02 LAB — CBC WITH DIFFERENTIAL/PLATELET
BASOS ABS: 0.1 10*3/uL (ref 0.0–0.1)
Basophils Relative: 1 % (ref 0–1)
EOS PCT: 2 % (ref 0–5)
Eosinophils Absolute: 0.2 10*3/uL (ref 0.0–0.7)
HEMATOCRIT: 31.5 % — AB (ref 36.0–46.0)
Hemoglobin: 9.8 g/dL — ABNORMAL LOW (ref 12.0–15.0)
LYMPHS ABS: 1.6 10*3/uL (ref 0.7–4.0)
LYMPHS PCT: 16 % (ref 12–46)
MCH: 25.5 pg — ABNORMAL LOW (ref 26.0–34.0)
MCHC: 31.1 g/dL (ref 30.0–36.0)
MCV: 82 fL (ref 78.0–100.0)
MPV: 10 fL (ref 8.6–12.4)
Monocytes Absolute: 0.5 10*3/uL (ref 0.1–1.0)
Monocytes Relative: 5 % (ref 3–12)
NEUTROS PCT: 76 % (ref 43–77)
Neutro Abs: 7.7 10*3/uL (ref 1.7–7.7)
PLATELETS: 341 10*3/uL (ref 150–400)
RBC: 3.84 MIL/uL — AB (ref 3.87–5.11)
RDW: 17.1 % — AB (ref 11.5–15.5)
WBC: 10.1 10*3/uL (ref 4.0–10.5)

## 2015-08-02 LAB — POCT INR: INR: 3

## 2015-08-02 LAB — BASIC METABOLIC PANEL
BUN: 27 mg/dL — AB (ref 7–25)
CHLORIDE: 105 mmol/L (ref 98–110)
CO2: 25 mmol/L (ref 20–31)
Calcium: 10 mg/dL (ref 8.6–10.4)
Creat: 1.95 mg/dL — ABNORMAL HIGH (ref 0.60–0.93)
Glucose, Bld: 106 mg/dL — ABNORMAL HIGH (ref 65–99)
POTASSIUM: 4.4 mmol/L (ref 3.5–5.3)
SODIUM: 139 mmol/L (ref 135–146)

## 2015-08-02 MED ORDER — ENOXAPARIN SODIUM 100 MG/ML ~~LOC~~ SOLN: 100.0000 mg | SUBCUTANEOUS | Status: DC

## 2015-08-02 NOTE — Telephone Encounter (Signed)
Returned call.  Patient stated that she had some anemia back earlier in the year when she had an EGD and colonoscopy.  She had blood drawn today but results aren't available yet.  I advised that this shouldn't be a problem for her upcoming catheterization.

## 2015-08-02 NOTE — Patient Instructions (Addendum)
08/03/15-Take your last dose of Coumadin  08/04/15- NO COUMADIN or LOVENOX  08/05/15-Start taking Lovenox 100mg  at 9p into the fatty abdominal tissue 2 inches away from the navel  08/05/15-Continue taking Lovenox 100mg  at 9p into the fatty abdominal tissue 2 inches away from the navel  08/06/15-Continue Lovenox 100mg  at 9p into the fatty abdominal tissue 2 inches away from the navel  08/07/15-Continue Lovenox 100mg  at 9p into the fatty abdominal tissue 2 inches away from the navel  08/08/15 NO LOVENOX  08/09/15 Procedure Day   You should resume Coumadin the day of your procedure. When you restart take an extra 1/2 tablet for 2 days (Thursday & Friday). You should resume Lovenox on the day after procedure-08/10/15 at 9am  08/10/15-Resume Lovenox at 9am & continue Coumadin until appt

## 2015-08-02 NOTE — Telephone Encounter (Signed)
New Message    Pt wants to tell [place] that she is anemic and wants to know if it will be a issue for her Cath next week

## 2015-08-02 NOTE — Telephone Encounter (Signed)
Candace from [facility] heart care - anti coag clinic called.  Letting Dr. Jana Hakim know that patient saw NP Catherin THompson this morning.  Pt will have a right and left heart cath on 11/16.  They have provided patient with prescription and direction for lovenox bridge, and post procedure pt will need to remain on lovenox until INR 2.0 or greater.    Pt will need follow up with Dr. Jana Hakim 11/22 - pof entered.  Routed to pod [facility] 2

## 2015-08-02 NOTE — Patient Instructions (Addendum)
Medication Instructions:  Your physician recommends that you continue on your current medications as directed. Please refer to the Current Medication list given to you today.   Labwork: TODAY:  BMET                CBC W/DIFF                PT/INR  Testing/Procedures: Your physician has requested that you have a cardiac catheterization. Cardiac catheterization is used to diagnose and/or treat various heart conditions. Doctors may recommend this procedure for a number of different reasons. The most common reason is to evaluate chest pain. Chest pain can be a symptom of coronary artery disease (CAD), and cardiac catheterization can show whether plaque is narrowing or blocking your heart's arteries. This procedure is also used to evaluate the valves, as well as measure the blood flow and oxygen levels in different parts of your heart. For further information please visit [email or web address]. Please follow instruction sheet, as given.  YOU ARE SCHEDULED FOR HEART CATHETERIZATION ON Thursday, 08-09-15.  PLEASE ARRIVE AT [facility] (WHERE THEY OFFER [facility]) AT 5:30 A.M.  NOTHING TO EAT OR DRINK AFTER MIDNIGHT THE NIGHT BEFORE ON THE NIGHT BEFORE, DO NOT TAKE YOUR LOSARTAN MEDICATION PLEASE BRING A BAG TO BE PREPARED TO STAY OVERNIGHT (WE ALWAYS TELL OUR PATIENTS THIS SO THEY WILL BE PREPARED)  Follow-Up: Your physician recommends that you schedule a follow-up appointment in:    Any Other Special Instructions Will Be Listed Below (If Applicable).  Coronary Angiogram A coronary angiogram, also called coronary angiography, is an X-ray procedure used to look at the arteries in the heart. In this procedure, a dye (contrast dye) is injected through a long, hollow tube (catheter). The catheter is about the size of a piece of cooked spaghetti and is inserted through your groin, wrist, or arm. The dye is injected into each artery, and X-rays are then taken to show if  there is a blockage in the arteries of your heart. LET [facility] CARE PROVIDER KNOW ABOUT:  Any allergies you have, including allergies to shellfish or contrast dye.   All medicines you are taking, including vitamins, herbs, eye drops, creams, and over-the-counter medicines.   Previous problems you or members of your family have had with the use of anesthetics.   Any blood disorders you have.   Previous surgeries you have had.  History of kidney problems or failure.   Other medical conditions you have. RISKS AND COMPLICATIONS  Generally, a coronary angiogram is a safe procedure. However, problems can occur and include:  Allergic reaction to the dye.  Bleeding from the access site or other locations.  Kidney injury, especially in people with impaired kidney function.  Stroke (rare).  Heart attack (rare). BEFORE THE PROCEDURE   Do not eat or drink anything after midnight the night before the procedure or as directed by your health care provider.   ON THE NIGHT OF 08-08-15, DO NOT TAKE THE LOSARTAN PROCEDURE  You may be given a medicine to help you relax (sedative) before the procedure. This medicine is given through an intravenous (IV) access tube that is inserted into one of your veins.   The area where the catheter will be inserted will be washed and shaved. This is usually done in the groin but may be done in the fold of your arm (near your elbow) or in the wrist.   A medicine will be given to numb  the area where the catheter will be inserted (local anesthetic).   The health care provider will insert the catheter into an artery. The catheter will be guided by using a special type of X-ray (fluoroscopy) of the blood vessel being examined.   A special dye will then be injected into the catheter, and X-rays will be taken. The dye will help to show where any narrowing or blockages are located in the heart arteries.  AFTER THE PROCEDURE   If the procedure is  done through the leg, you will be kept in bed lying flat for several hours. You will be instructed to not bend or cross your legs.  The insertion site will be checked frequently.   The pulse in your feet or wrist will be checked frequently.   Additional blood tests, X-rays, and an electrocardiogram may be done.    This information is not intended to replace advice given to you by your health care provider. Make sure you discuss any questions you have with your health care provider.   Document Released: 03/15/2003 Document Revised: 09/29/2014 Document Reviewed: 01/31/2013 Elsevier Interactive Patient Education [employer].     If you need a refill on your cardiac medications before your next appointment, please call your pharmacy.

## 2015-08-03 ENCOUNTER — Telehealth: Payer: Self-pay | Admitting: *Deleted

## 2015-08-03 ENCOUNTER — Telehealth: Payer: Self-pay | Admitting: Oncology

## 2015-08-03 ENCOUNTER — Telehealth: Payer: Self-pay

## 2015-08-03 DIAGNOSIS — I350 Nonrheumatic aortic (valve) stenosis: Secondary | ICD-10-CM

## 2015-08-03 LAB — PROTIME-INR
INR: 2.37 — ABNORMAL HIGH (ref <1.50)
Prothrombin Time: 26.2 seconds — ABNORMAL HIGH (ref 11.6–15.2)

## 2015-08-03 NOTE — Telephone Encounter (Signed)
Patient called today and wanted Dr. Jana Hakim to know that she is having a cardiac catheterization on 08/09/15.  She's is being bridged to lovenox by the coumadin clinic at [place].

## 2015-08-03 NOTE — Telephone Encounter (Signed)
Notes Recorded by Larey Dresser, MD   Stop ARB, make lasix prn, repeat BMET Monday.

## 2015-08-03 NOTE — Telephone Encounter (Signed)
Patient called in to reschedule her 11/16 lab to 11/22 as she is having a heart cath and they were to have called [place] regarding this

## 2015-08-06 ENCOUNTER — Other Ambulatory Visit (INDEPENDENT_AMBULATORY_CARE_PROVIDER_SITE_OTHER): Payer: Medicare HMO | Admitting: *Deleted

## 2015-08-06 DIAGNOSIS — I350 Nonrheumatic aortic (valve) stenosis: Secondary | ICD-10-CM

## 2015-08-06 LAB — BASIC METABOLIC PANEL
BUN: 21 mg/dL (ref 7–25)
CALCIUM: 9.7 mg/dL (ref 8.6–10.4)
CO2: 24 mmol/L (ref 20–31)
Chloride: 105 mmol/L (ref 98–110)
Creat: 1.85 mg/dL — ABNORMAL HIGH (ref 0.60–0.93)
GLUCOSE: 96 mg/dL (ref 65–99)
Potassium: 4.4 mmol/L (ref 3.5–5.3)
Sodium: 140 mmol/L (ref 135–146)

## 2015-08-06 NOTE — Addendum Note (Signed)
Addended by: Katrine Coho on: 08/06/2015 04:55 PM   Modules accepted: Orders

## 2015-08-07 ENCOUNTER — Ambulatory Visit: Payer: Medicare HMO | Admitting: Adult Health

## 2015-08-08 ENCOUNTER — Other Ambulatory Visit: Payer: Medicare HMO

## 2015-08-08 ENCOUNTER — Telehealth: Payer: Self-pay | Admitting: *Deleted

## 2015-08-08 NOTE — Telephone Encounter (Signed)
Had question regarding her lovenox bridge and this nurse went over these instructions given on her coumadin visit of 08/02/2015 and she stated understanding

## 2015-08-09 ENCOUNTER — Telehealth: Payer: Self-pay | Admitting: Oncology

## 2015-08-09 ENCOUNTER — Ambulatory Visit (HOSPITAL_COMMUNITY)
Admission: RE | Admit: 2015-08-09 | Discharge: 2015-08-09 | Disposition: A | Discharge status: Home / Self Care | Payer: Medicare HMO | Source: Ambulatory Visit | Attending: Cardiology | Admitting: Cardiology

## 2015-08-09 ENCOUNTER — Encounter (HOSPITAL_COMMUNITY)
Admission: RE | Disposition: A | Discharge status: Home / Self Care | Payer: Self-pay | Source: Ambulatory Visit | Attending: Cardiology

## 2015-08-09 DIAGNOSIS — J449 Chronic obstructive pulmonary disease, unspecified: Secondary | ICD-10-CM | POA: Insufficient documentation

## 2015-08-09 DIAGNOSIS — Z7901 Long term (current) use of anticoagulants: Secondary | ICD-10-CM | POA: Insufficient documentation

## 2015-08-09 DIAGNOSIS — N189 Chronic kidney disease, unspecified: Secondary | ICD-10-CM | POA: Insufficient documentation

## 2015-08-09 DIAGNOSIS — I5032 Chronic diastolic (congestive) heart failure: Secondary | ICD-10-CM | POA: Insufficient documentation

## 2015-08-09 DIAGNOSIS — Z6828 Body mass index (BMI) 28.0-28.9, adult: Secondary | ICD-10-CM | POA: Diagnosis not present

## 2015-08-09 DIAGNOSIS — D6861 Antiphospholipid syndrome: Secondary | ICD-10-CM | POA: Insufficient documentation

## 2015-08-09 DIAGNOSIS — I272 Other secondary pulmonary hypertension: Secondary | ICD-10-CM | POA: Insufficient documentation

## 2015-08-09 DIAGNOSIS — E039 Hypothyroidism, unspecified: Secondary | ICD-10-CM | POA: Insufficient documentation

## 2015-08-09 DIAGNOSIS — M069 Rheumatoid arthritis, unspecified: Secondary | ICD-10-CM | POA: Diagnosis not present

## 2015-08-09 DIAGNOSIS — I4510 Unspecified right bundle-branch block: Secondary | ICD-10-CM | POA: Insufficient documentation

## 2015-08-09 DIAGNOSIS — Z8249 Family history of ischemic heart disease and other diseases of the circulatory system: Secondary | ICD-10-CM | POA: Insufficient documentation

## 2015-08-09 DIAGNOSIS — Z9981 Dependence on supplemental oxygen: Secondary | ICD-10-CM | POA: Insufficient documentation

## 2015-08-09 DIAGNOSIS — E785 Hyperlipidemia, unspecified: Secondary | ICD-10-CM | POA: Diagnosis not present

## 2015-08-09 DIAGNOSIS — I350 Nonrheumatic aortic (valve) stenosis: Secondary | ICD-10-CM

## 2015-08-09 DIAGNOSIS — E669 Obesity, unspecified: Secondary | ICD-10-CM | POA: Insufficient documentation

## 2015-08-09 DIAGNOSIS — I130 Hypertensive heart and chronic kidney disease with heart failure and stage 1 through stage 4 chronic kidney disease, or unspecified chronic kidney disease: Secondary | ICD-10-CM | POA: Diagnosis not present

## 2015-08-09 DIAGNOSIS — Z7982 Long term (current) use of aspirin: Secondary | ICD-10-CM | POA: Diagnosis not present

## 2015-08-09 DIAGNOSIS — D680 Von Willebrand's disease: Secondary | ICD-10-CM | POA: Insufficient documentation

## 2015-08-09 DIAGNOSIS — Z87891 Personal history of nicotine dependence: Secondary | ICD-10-CM | POA: Diagnosis not present

## 2015-08-09 DIAGNOSIS — M109 Gout, unspecified: Secondary | ICD-10-CM | POA: Insufficient documentation

## 2015-08-09 DIAGNOSIS — I2510 Atherosclerotic heart disease of native coronary artery without angina pectoris: Secondary | ICD-10-CM | POA: Insufficient documentation

## 2015-08-09 HISTORY — PX: CARDIAC CATHETERIZATION: SHX172

## 2015-08-09 LAB — POCT I-STAT 3, VENOUS BLOOD GAS (G3P V)
Acid-base deficit: 3 mmol/L — ABNORMAL HIGH (ref 0.0–2.0)
Bicarbonate: 22.6 mEq/L (ref 20.0–24.0)
O2 SAT: 65 %
PCO2 VEN: 41.4 mmHg — AB (ref 45.0–50.0)
PO2 VEN: 36 mmHg (ref 30.0–45.0)
TCO2: 24 mmol/L (ref 0–100)
pH, Ven: 7.346 — ABNORMAL HIGH (ref 7.250–7.300)

## 2015-08-09 LAB — PROTIME-INR
INR: 1.02 (ref 0.00–1.49)
PROTHROMBIN TIME: 13.6 s (ref 11.6–15.2)

## 2015-08-09 LAB — BASIC METABOLIC PANEL
Anion gap: 9 (ref 5–15)
BUN: 19 mg/dL (ref 6–20)
CHLORIDE: 106 mmol/L (ref 101–111)
CO2: 25 mmol/L (ref 22–32)
CREATININE: 1.75 mg/dL — AB (ref 0.44–1.00)
Calcium: 9.9 mg/dL (ref 8.9–10.3)
GFR calc Af Amer: 33 mL/min — ABNORMAL LOW (ref >60)
GFR calc non Af Amer: 28 mL/min — ABNORMAL LOW (ref >60)
GLUCOSE: 115 mg/dL — AB (ref 65–99)
POTASSIUM: 4.1 mmol/L (ref 3.5–5.1)
Sodium: 140 mmol/L (ref 135–145)

## 2015-08-09 SURGERY — RIGHT/LEFT HEART CATH AND CORONARY ANGIOGRAPHY
Anesthesia: LOCAL

## 2015-08-09 MED ORDER — HEPARIN (PORCINE) IN NACL 2-0.9 UNIT/ML-% IJ SOLN
INTRAMUSCULAR | Status: DC | PRN
  Administered 2015-08-09: 08:00:00 via INTRA_ARTERIAL

## 2015-08-09 MED ORDER — SODIUM CHLORIDE 0.9 % WEIGHT BASED INFUSION
3.0000 mL/kg/h | INTRAVENOUS | Status: AC
  Administered 2015-08-09: 3 mL/kg/h via INTRAVENOUS

## 2015-08-09 MED ORDER — SODIUM CHLORIDE 0.9 % WEIGHT BASED INFUSION: 1.0000 mL/kg/h | INTRAVENOUS | Status: DC

## 2015-08-09 MED ORDER — SODIUM CHLORIDE 0.9 % WEIGHT BASED INFUSION: 3.0000 mL/kg/h | INTRAVENOUS | Status: AC

## 2015-08-09 MED ORDER — ASPIRIN 81 MG PO CHEW
81.0000 mg | CHEWABLE_TABLET | ORAL | Status: AC
  Administered 2015-08-09: 81 mg via ORAL

## 2015-08-09 MED ORDER — ASPIRIN 81 MG PO CHEW
CHEWABLE_TABLET | ORAL | Status: AC
  Filled 2015-08-09: qty 1

## 2015-08-09 MED ORDER — ONDANSETRON HCL 4 MG/2ML IJ SOLN: 4.0000 mg | Freq: Four times a day (QID) | INTRAMUSCULAR | Status: DC | PRN

## 2015-08-09 MED ORDER — MIDAZOLAM HCL 2 MG/2ML IJ SOLN
INTRAMUSCULAR | Status: DC | PRN
  Administered 2015-08-09 (×2): 1 mg via INTRAVENOUS

## 2015-08-09 MED ORDER — IOHEXOL 350 MG/ML SOLN
INTRAVENOUS | Status: DC | PRN
  Administered 2015-08-09: 30 mL via INTRA_ARTERIAL

## 2015-08-09 MED ORDER — HEPARIN SODIUM (PORCINE) 1000 UNIT/ML IJ SOLN
INTRAMUSCULAR | Status: DC | PRN
  Administered 2015-08-09: 3500 [IU] via INTRAVENOUS

## 2015-08-09 MED ORDER — FENTANYL CITRATE (PF) 100 MCG/2ML IJ SOLN
INTRAMUSCULAR | Status: AC
  Filled 2015-08-09: qty 2

## 2015-08-09 MED ORDER — LIDOCAINE HCL (PF) 1 % IJ SOLN
INTRAMUSCULAR | Status: AC
  Filled 2015-08-09: qty 30

## 2015-08-09 MED ORDER — MIDAZOLAM HCL 2 MG/2ML IJ SOLN
INTRAMUSCULAR | Status: AC
  Filled 2015-08-09: qty 2

## 2015-08-09 MED ORDER — HEPARIN SODIUM (PORCINE) 1000 UNIT/ML IJ SOLN
INTRAMUSCULAR | Status: AC
  Filled 2015-08-09: qty 1

## 2015-08-09 MED ORDER — SODIUM CHLORIDE 0.9 % IJ SOLN: 3.0000 mL | INTRAMUSCULAR | Status: DC | PRN

## 2015-08-09 MED ORDER — VERAPAMIL HCL 2.5 MG/ML IV SOLN
INTRAVENOUS | Status: AC
  Filled 2015-08-09: qty 2

## 2015-08-09 MED ORDER — NITROGLYCERIN 1 MG/10 ML FOR IR/CATH LAB
INTRA_ARTERIAL | Status: DC | PRN
  Administered 2015-08-09: 09:00:00

## 2015-08-09 MED ORDER — HYDRALAZINE HCL 20 MG/ML IJ SOLN: 10.0000 mg | INTRAMUSCULAR | Status: DC | PRN

## 2015-08-09 MED ORDER — SODIUM CHLORIDE 0.9 % IV SOLN: 250.0000 mL | INTRAVENOUS | Status: DC | PRN

## 2015-08-09 MED ORDER — SODIUM CHLORIDE 0.9 % IJ SOLN: 3.0000 mL | Freq: Two times a day (BID) | INTRAMUSCULAR | Status: DC

## 2015-08-09 MED ORDER — NITROGLYCERIN 1 MG/10 ML FOR IR/CATH LAB
INTRA_ARTERIAL | Status: AC
  Filled 2015-08-09: qty 10

## 2015-08-09 MED ORDER — ACETAMINOPHEN 325 MG PO TABS: 650.0000 mg | ORAL_TABLET | ORAL | Status: DC | PRN

## 2015-08-09 MED ORDER — SODIUM CHLORIDE 0.9 % IJ SOLN
3.0000 mL | INTRAMUSCULAR | Status: DC | PRN
Start: 2015-08-09 — End: 2015-08-09

## 2015-08-09 MED ORDER — FENTANYL CITRATE (PF) 100 MCG/2ML IJ SOLN
INTRAMUSCULAR | Status: DC | PRN
  Administered 2015-08-09 (×2): 25 ug via INTRAVENOUS

## 2015-08-09 SURGICAL SUPPLY — 14 items
CATH BALLN WEDGE 5F 110CM (CATHETERS) ×2 IMPLANT
CATH INFINITI 5 FR JL3.5 (CATHETERS) ×2 IMPLANT
CATH INFINITI JR4 5F (CATHETERS) ×2 IMPLANT
DEVICE RAD COMP TR BAND LRG (VASCULAR PRODUCTS) ×2 IMPLANT
GLIDESHEATH SLEND SS 6F .021 (SHEATH) ×2 IMPLANT
GUIDEWIRE .025 260CM (WIRE) ×2 IMPLANT
KIT HEART LEFT (KITS) ×2 IMPLANT
KIT HEART RIGHT NAMIC (KITS) ×2 IMPLANT
PACK CARDIAC CATHETERIZATION (CUSTOM PROCEDURE TRAY) ×2 IMPLANT
SHEATH FAST CATH BRACH 5F 5CM (SHEATH) ×4 IMPLANT
TRANSDUCER W/STOPCOCK (MISCELLANEOUS) ×2 IMPLANT
TUBING CIL FLEX 10 FLL-RA (TUBING) ×2 IMPLANT
WIRE HI TORQ VERSACORE-J 145CM (WIRE) ×2 IMPLANT
WIRE SAFE-T 1.5MM-J .035X260CM (WIRE) ×2 IMPLANT

## 2015-08-09 NOTE — H&P (View-Only) (Signed)
  Cardiology Office Note   Date:  08/02/2015   ID:  Karen Chambers, DOB 06/23/1946, MRN [id number]  PCP:  SPENCER,SARA C, PA-C  Cardiologist:  Dr. McLean   Abnormal 2D ECHO - severe AS.   History of Present Illness: Karen Chambers is a 68 y.o. 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   She was last seen by Dr. McLean in 11/2013 and Michelle Lenze PA-C in 10/2014. Felt to be stable from a cardiac standpoint. A murmur was noted both office visits and felt to be due to turbulence across LV outflow tract with no significant gradient noted. She was recently seen by Tammy Parret NP who follows her COPD for what she thought was bronchitis. She had what was felt to be in a COPD and CHF exacerbation: CXR 07/06/15 w/  with pulmonary interstitial edema and bilateral effusions. BNP elevated to 1262. She was started on Lasix 20mg x 3 days. 2D ECHO was ordered. This revealed EF 60-65%, G2DD, and severe AS and she was referred back to cardiology for follow up.   Today she presents to clinic for evaluation. She reports a little tightness in her chest mostly in the morning when she gets up. It radiates across her chest and resolves after sitting for a little bit. She has chronic SOB due to COPD which has not changed recently. She does have some DOE. She is not very active due to knee arthritis. Walks with a walker but she is able to do what she wants to do. Her main problems are her knees and her back. She does take lasix PRN (~4 x a week) for chest tightness and it seems to help. She reports having a murmur for as long as she can remember. Her sisters also have heart murmurs   Past Medical History:  1. Hyperlipidemia  2. HTN: renal artery ultrasound (10/11) with no renal artery stenosis. ACEI cough.  3. Hypothyroidism  4. History of  UTIs  5. 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 Followed by Dr. Magrinat. On permanent warfarin. Goal INR has been 2.5-3.5. 6. Obesity  7. Gout: questionable. Uric acid normal when checked in 10/11.  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 Echo (8/11): moderate focal basal septal hypertrophy, EF 55-60%, no LV outflow tract gradient, grade I diastolic dysfunction, no regional wall motion abnormalities, no systolic anteiror motion of the mitral valve, PA systolic pressure 30 mmHg.  10. Low back pain  11. Rheumatoid arthritis on pred and MTX in past  12. COPD on home oxygen: Gold Stage 2 copd (followed by Dr. Young 2005-2006 and Dr. Ramaswamy Jan 2012 - date). - MM genotype 13. RBBB  Family History:  Mother with "enlarged heart," probably died of MI at age 76. Father passed away young from cancer.   Social History:  Worked in accounts payable department, now retired. Prior smoker, quit 2000. Separated from husband. Has children.   Past Medical History  Diagnosis Date  . Other and unspecified hyperlipidemia 401.9  . Hypothyroidism   . Obesity, unspecified   . Gout   . Low back pain     buldging disc ,and herniated disc  . Rheumatoid arthritis(714.0)   . Cough 11/12/2010  . Hypertension   . Coagulopathy (HCC)   . COPD (chronic obstructive pulmonary   disease) (HCC)   . Complication of anesthesia     slow awaken from anesthesia  . Von Willebrand disease (HCC)     Dr. Magrinat follows    Past Surgical History  Procedure Laterality Date  . Cholecystectomy    . Total abdominal hysterectomy    . Stapedectomy Bilateral     right ear hearing aid  . Esophagogastroduodenoscopy (egd) with propofol N/A 06/23/2014    Procedure: ESOPHAGOGASTRODUODENOSCOPY (EGD) WITH PROPOFOL;  Surgeon: Jay M Pyrtle, MD;  Location: [facility];  Service: Gastroenterology;  Laterality: N/A;  . Colonoscopy with propofol N/A 06/23/2014     Procedure: COLONOSCOPY WITH PROPOFOL;  Surgeon: Jay M Pyrtle, MD;  Location: [facility];  Service: Gastroenterology;  Laterality: N/A;     Current Outpatient Prescriptions  Medication Sig Dispense Refill  . acetaminophen (TYLENOL ARTHRITIS PAIN) 650 MG CR tablet Take 650-1,300 mg by mouth 2 (two) times daily.    . amLODipine (NORVASC) 10 MG tablet Take 1 tablet (10 mg total) by mouth every evening. 90 tablet 4  . aspirin 81 MG tablet Take 81 mg by mouth at bedtime.     . atorvastatin (LIPITOR) 20 MG tablet TAKE 1 TABLET DAILY. 90 tablet 1  . febuxostat (ULORIC) 40 MG tablet Take 40 mg by mouth every morning.     . furosemide (LASIX) 20 MG tablet Take 1 tablet (20 mg total) by mouth daily as needed for edema. 30 tablet 0  . levothyroxine (SYNTHROID, LEVOTHROID) 100 MCG tablet TAKE 1 TABLET DAILY 90 tablet 3  . losartan (COZAAR) 100 MG tablet TAKE 1 TABLET EVERY DAY 90 tablet 2  . metoprolol succinate (TOPROL-XL) 100 MG 24 hr tablet Take 1 tablet (100 mg total) by mouth 2 (two) times daily. Take with or immediately following a meal. 180 tablet 3  . Multiple Vitamin (MULTIVITAMIN) capsule Take 1 capsule by mouth daily at 12 noon.     . Omega 3 1200 MG CAPS Take 1,500 mg by mouth daily at 12 noon. 1200mg softgel + 300mg softgel.    . potassium chloride SA (K-DUR,KLOR-CON) 20 MEQ tablet TAKE 1 TABLET (20 MEQ TOTAL) EVERY DAY 90 tablet 1  . tiotropium (SPIRIVA HANDIHALER) 18 MCG inhalation capsule INHALE ONE CAPSULE ONCE DAILY 30 capsule 6  . traMADol (ULTRAM) 50 MG tablet Take 100 mg by mouth 2 (two) times daily.     . warfarin (COUMADIN) 5 MG tablet TAKE 1 TABLET DAILY OR AS DIRECTED BY THE COUMADIN CLINIC 105 tablet 3  . enoxaparin (LOVENOX) 100 MG/ML injection Inject 1 mL (100 mg total) into the skin daily. 10 Syringe 1   No current facility-administered medications for this visit.    Allergies:   Allopurinol; Cephalexin; Nitrofurantoin; Ace inhibitors; and Sulfonamide derivatives     Social History:  The patient  reports that she quit smoking about 16 years ago. Her smoking use included Cigarettes. She has a 54 pack-year smoking history. She has never used smokeless tobacco. She reports that she does not drink alcohol or use illicit drugs.   Family History:  The patient's family history includes Cancer in her father; Heart attack in her mother; Heart disease in her mother; Hypertension in her sister. There is no history of Stroke.    ROS:  Please see the history of present illness.   Otherwise, review of systems are positive for none.   All other systems are reviewed and negative.    PHYSICAL EXAM: VS:  BP 145/55 mmHg  Pulse 86    Ht 5' 2" (1.575 m)  Wt 154 lb (69.854 kg)  BMI 28.16 kg/m2 , BMI Body mass index is 28.16 kg/(m^2). GEN: Well nourished, well developed, in no acute distress HEENT: normal Neck: no JVD, carotid bruits, or masses Cardiac: RRR; no murmurs, rubs, or gallops,no edema . SEM @ RUSB Respiratory:  clear to auscultation bilaterally, normal work of breathing.  GI: soft, nontender, nondistended, + BS MS: no deformity or atrophy Skin: warm and dry, no rash Neuro:  Strength and sensation are intact Psych: euthymic mood, full affect   EKG:  EKG is ordered today. The ekg ordered today demonstrates NSR with PVC. RBBB.   Recent Labs: 07/10/2015: BUN 27*; Creatinine, Ser 1.55*; Potassium 4.5; Pro B Natriuretic peptide (BNP) 1262.0*; Sodium 139    Lipid Panel    Component Value Date/Time   CHOL 149 02/16/2015 0827   TRIG 265.0* 02/16/2015 0827   HDL 43.10 02/16/2015 0827   CHOLHDL 3 02/16/2015 0827   VLDL 53.0* 02/16/2015 0827   LDLCALC 70 12/07/2013 1458   LDLDIRECT 55.0 02/16/2015 0827      Wt Readings from Last 3 Encounters:  08/02/15 154 lb (69.854 kg)  07/10/15 156 lb 12.8 oz (71.124 kg)  07/06/15 158 lb 3.2 oz (71.759 kg)      Other studies Reviewed: Additional studies/ records that were reviewed today include: 2D  ECHO Review of the above records demonstrates:  2D ECHO 07/30/2015 LV EF: 60- 65% Study Conclusions - Left ventricle: The cavity size was normal. Wall thickness was increased in a pattern of mild LVH. Systolic function was normal. The estimated ejection fraction was in the range of 60% to 65%. Wall motion was normal; there were no regional wall motion abnormalities. Features are consistent with a pseudonormal left ventricular filling pattern, with concomitant abnormal relaxation and increased filling pressure (grade 2 diastolic dysfunction). - Aortic valve: Trileaflet; severely calcified leaflets. There was severe stenosis. Mean gradient (S): 58 mm Hg. Valve area (VTI): 1.15 cm^2. Valve area (Vmax): 0.94 cm^2. - Mitral valve: Moderately to severely calcified annulus. Moderately calcified leaflets . The findings are consistent with mild stenosis. There was trivial regurgitation. Mean gradient (D): 10 mm Hg. Valve area by pressure half-time: 2.61 cm^2. - Left atrium: The atrium was moderately dilated. - Right ventricle: The cavity size was normal. Systolic function was normal. - Pulmonary arteries: No complete TR doppler jet so unable to estimate PA systolic pressure. - Inferior vena cava: The vessel was normal in size. The respirophasic diameter changes were in the normal range (= 50%), consistent with normal central venous pressure. Impressions: - Normal LV size with mild LV hypertrophy. EF 60-65%. Moderate diastolic dysfunction. Normal RV size and systolic function. Severe aortic stenosis by mean gradient, AVA 1.1 cm^2 by VTI, 0.9 cm^2 by Vmax. I think that the AS is severe. The mitral valve and annulus are heavily calcified. Mean gradient across the valve is 10 mmHg but MVA by PHT is 2.6 cm^2. I do not think that the mitral stenosis is more than mild.   2Decho 2011 Study Conclusions - Left ventricle: The cavity size was normal.  There was moderate  focal basal hypertrophy. Systolic function was normal. The  estimated ejection fraction was in the range of 55% to 60%. There  was no dynamic obstruction. Wall motion was normal; there were no  regional wall motion abnormalities. Doppler parameters are  consistent with abnormal left ventricular relaxation (grade 1  diastolic dysfunction). - Aortic valve: Mildly elevated velocity through AV   likely from  basal septal hypertrophy and flow acceleration. No SAM or evidence  of HOCM Valve area: 2.68cm^2(VTI). Valve area: 2.75cm^2 (Vmax). - Mitral valve: Mildly calcified annulus. Mild regurgitation. - Left atrium: The atrium was mildly dilated. - Pulmonary arteries: Systolic pressure was mildly increased    ASSESSMENT AND PLAN:  Karen Chambers is a 68 y.o. 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   Severe aortic stenosis- Trileaflet; severely calcified leaflets. Mean gradient (S): 58 mm Hg. Valve area (VTI): 1.15 cm^2. Valve area (Vmax): 0.94 cm^2.. -- She will need L/RHC. I have set this up for next Thursday 08/09/15 with Dr. McLean. May be AVR vs TAVR candidate. Dr. McLean to decide next step in management after L/RHC. We will get the appropriate lab work today  Chronic diastolic CHF -- Appears euvolemic on exam  -- Continue lasix 20mg po qd PRN  HTN- BP 145/55. Continue amlodipine 10mg, losartan 100mg and Toprol XL 100mg BID. She will hold ARB before cath.   CKD- creat around 1.55 (as above will hold ARB the day before the cath- she takes lasix PRN)  COPD- continue home 02. Followed by pulmonology   HLD- continue statin  Antiphospholipid antibody syndrome- on chronic coumadin. We will have to bridge her before L/RHC. She will see Sally Earl Pharm D in the coumadin clinic today  for help in bridging.   RA  Current medicines are reviewed at length with the patient today.  The patient does not have concerns regarding medicines.  The following changes have been made:  no change  Labs/ tests ordered today include:    Orders Placed This Encounter  Procedures  . Basic Metabolic Panel (BMET)  . CBC w/Diff  . INR/PT  . EKG 12-Lead     Disposition:   FU with Dr. McLean - follow up will depend on findings of L/RHC  Signed, Renad Jenniges R, PA-C  08/02/2015 1:42 PM    [facility] [address], [place], [place]  [address] Phone: [phone]; Fax: [phone]    

## 2015-08-09 NOTE — Interval H&P Note (Signed)
History and Physical Interval Note:  08/09/2015 7:46 AM  Karen Chambers  has presented today for surgery, with the diagnosis of aortic stenosis  The various methods of treatment have been discussed with the patient and family. After consideration of risks, benefits and other options for treatment, the patient has consented to  Procedure(s): Right/Left Heart Cath and Coronary Angiography (N/A) as a surgical intervention .  The patient's history has been reviewed, patient examined, no change in status, stable for surgery.  I have reviewed the patient's chart and labs.  Questions were answered to the patient's satisfaction.     Alexzavier Girardin [employer]

## 2015-08-09 NOTE — Progress Notes (Signed)
Site area: right brachial  Site Prior to Removal:  Level 0  Pressure Applied For 15 MINUTES    Minutes Beginning at 0905  Manual:   Yes.    Patient Status During Pull:  stable  Post Pull Brachial Site:  Level 0  Post Pull Instructions Given:  Yes.    Post Pull Pulses Present:  Yes.    Dressing Applied:  Yes.     Bedrest Begins: 0920  Comments:  Pt tolerated brachial venous sheath pull well. VSS

## 2015-08-09 NOTE — Telephone Encounter (Signed)
Called patient and left a message with new appointments for 11/22 with lab and md,ok per val-verbal

## 2015-08-09 NOTE — Discharge Instructions (Signed)
Radial Site Care °Refer to this sheet in the next few weeks. These instructions provide you with information about caring for yourself after your procedure. Your health care provider may also give you more specific instructions. Your treatment has been planned according to current medical practices, but problems sometimes occur. Call your health care provider if you have any problems or questions after your procedure. °WHAT TO EXPECT AFTER THE PROCEDURE °After your procedure, it is typical to have the following: °· Bruising at the radial site that usually fades within 1-2 weeks. °· Blood collecting in the tissue (hematoma) that may be painful to the touch. It should usually decrease in size and tenderness within 1-2 weeks. °HOME CARE INSTRUCTIONS °· Take medicines only as directed by your health care provider. °· You may shower 24-48 hours after the procedure or as directed by your health care provider. Remove the bandage (dressing) and gently wash the site with plain soap and water. Pat the area dry with a clean towel. Do not rub the site, because this may cause bleeding. °· Do not take baths, swim, or use a hot tub until your health care provider approves. °· Check your insertion site every day for redness, swelling, or drainage. °· Do not apply powder or lotion to the site. °· Do not flex or bend the affected arm for 24 hours or as directed by your health care provider. °· Do not push or pull heavy objects with the affected arm for 24 hours or as directed by your health care provider. °· Do not lift over 10 lb (4.5 kg) for 5 days after your procedure or as directed by your health care provider. °· Ask your health care provider when it is okay to: °¨ Return to work or school. °¨ Resume usual physical activities or sports. °¨ Resume sexual activity. °· Do not drive home if you are discharged the same day as the procedure. Have someone else drive you. °· You may drive 24 hours after the procedure unless otherwise  instructed by your health care provider. °· Do not operate machinery or power tools for 24 hours after the procedure. °· If your procedure was done as an outpatient procedure, which means that you went home the same day as your procedure, a responsible adult should be with you for the first 24 hours after you arrive home. °· Keep all follow-up visits as directed by your health care provider. This is important. °SEEK MEDICAL CARE IF: °· You have a fever. °· You have chills. °· You have increased bleeding from the radial site. Hold pressure on the site. °SEEK IMMEDIATE MEDICAL CARE IF: °· You have unusual pain at the radial site. °· You have redness, warmth, or swelling at the radial site. °· You have drainage (other than a small amount of blood on the dressing) from the radial site. °· The radial site is bleeding, hold steady pressure on the site and call 911. °· Your arm or hand becomes pale, cool, tingly, or numb. °  °This information is not intended to replace advice given to you by your health care provider. Make sure you discuss any questions you have with your health care provider. °  °Document Released: 10/11/2010 Document Revised: 09/29/2014 Document Reviewed: 03/27/2014 °Elsevier Interactive Patient Education ©2016 [employer] ° °

## 2015-08-10 ENCOUNTER — Encounter (HOSPITAL_COMMUNITY): Payer: Self-pay | Admitting: Cardiology

## 2015-08-10 ENCOUNTER — Other Ambulatory Visit: Payer: Self-pay | Admitting: *Deleted

## 2015-08-13 ENCOUNTER — Other Ambulatory Visit (HOSPITAL_BASED_OUTPATIENT_CLINIC_OR_DEPARTMENT_OTHER): Payer: Medicare HMO

## 2015-08-13 ENCOUNTER — Telehealth: Payer: Self-pay | Admitting: *Deleted

## 2015-08-13 DIAGNOSIS — D689 Coagulation defect, unspecified: Secondary | ICD-10-CM | POA: Diagnosis not present

## 2015-08-13 LAB — PROTIME-INR
INR: 1.6 — ABNORMAL LOW (ref 2.00–3.50)
Protime: 19.2 Seconds — ABNORMAL HIGH (ref 10.6–13.4)

## 2015-08-13 NOTE — Telephone Encounter (Signed)
This RN spoke with pt per resuming of coumadin  INR today is 1.6  MD is requesting pt to continue lovenox at present.  She should increase her coumadin by 2.5mg  daily until recheck on Wed 11/23.  Per above conversation with pt-   She will take 7.5 mg tonight and 5mg  tomorrow per increased dose.  She will come in Wed for recheck ( appointment made by this RN

## 2015-08-14 ENCOUNTER — Ambulatory Visit: Payer: Medicare HMO | Admitting: Oncology

## 2015-08-14 ENCOUNTER — Other Ambulatory Visit: Payer: Medicare HMO

## 2015-08-14 NOTE — Progress Notes (Signed)
Called patient she was able to get a lovenox injection from her neighbor today.  She will be in for a repeat INR tomorrow.

## 2015-08-15 ENCOUNTER — Encounter: Payer: Self-pay | Admitting: *Deleted

## 2015-08-15 ENCOUNTER — Telehealth: Payer: Self-pay | Admitting: *Deleted

## 2015-08-15 ENCOUNTER — Other Ambulatory Visit (HOSPITAL_BASED_OUTPATIENT_CLINIC_OR_DEPARTMENT_OTHER): Payer: Medicare HMO

## 2015-08-15 DIAGNOSIS — D689 Coagulation defect, unspecified: Secondary | ICD-10-CM | POA: Diagnosis not present

## 2015-08-15 LAB — PROTIME-INR
INR: 2.8 (ref 2.00–3.50)
PROTIME: 33.6 s — AB (ref 10.6–13.4)

## 2015-08-15 NOTE — Telephone Encounter (Signed)
INR  2.8  Current coumadin dose is 5mg  daily except 2.5mg  on t/fri  Pt is on lovenox presently post procedure for bridging.  Next lab scheduled is 12/14.  Per MD pt may stop lovenox and continue current coumadin dose.  This RN called and informed pt of above.

## 2015-08-22 ENCOUNTER — Ambulatory Visit (INDEPENDENT_AMBULATORY_CARE_PROVIDER_SITE_OTHER): Payer: Medicare HMO | Admitting: Cardiology

## 2015-08-22 ENCOUNTER — Encounter: Payer: Self-pay | Admitting: Cardiology

## 2015-08-22 VITALS — BP 140/80 | HR 81 | Ht 62.5 in | Wt 154.4 lb

## 2015-08-22 DIAGNOSIS — D6861 Antiphospholipid syndrome: Secondary | ICD-10-CM

## 2015-08-22 DIAGNOSIS — I350 Nonrheumatic aortic (valve) stenosis: Secondary | ICD-10-CM

## 2015-08-22 LAB — BASIC METABOLIC PANEL
BUN: 30 mg/dL — ABNORMAL HIGH (ref 7–25)
CALCIUM: 9.1 mg/dL (ref 8.6–10.4)
CO2: 24 mmol/L (ref 20–31)
Chloride: 105 mmol/L (ref 98–110)
Creat: 1.74 mg/dL — ABNORMAL HIGH (ref 0.60–0.93)
Glucose, Bld: 93 mg/dL (ref 65–99)
POTASSIUM: 4.4 mmol/L (ref 3.5–5.3)
SODIUM: 139 mmol/L (ref 135–146)

## 2015-08-22 MED ORDER — FUROSEMIDE 20 MG PO TABS: 20.0000 mg | ORAL_TABLET | Freq: Every day | ORAL | Status: DC

## 2015-08-22 MED ORDER — POTASSIUM CHLORIDE ER 10 MEQ PO TBCR: 10.0000 meq | EXTENDED_RELEASE_TABLET | Freq: Every day | ORAL | Status: DC

## 2015-08-22 NOTE — Patient Instructions (Signed)
Medication Instructions: Start lasix (furosemide) 20mg  daily. Start KCL (potassium) 10 mEq daily.  Labwork: BMET/BNP today.  BMET in 2 weeks--I have given you an order for this. Please fax the results to Dr Aundra Dubin [phone]  Testing/Procedures: None today  Follow-Up: Your physician recommends that you schedule a follow-up appointment in: 3 months with Dr Aundra Dubin.         If you need a refill on your cardiac medications before your next appointment, please call your pharmacy.

## 2015-08-23 DIAGNOSIS — I350 Nonrheumatic aortic (valve) stenosis: Secondary | ICD-10-CM | POA: Insufficient documentation

## 2015-08-23 LAB — BRAIN NATRIURETIC PEPTIDE: BRAIN NATRIURETIC PEPTIDE: 660 pg/mL — AB (ref 0.0–100.0)

## 2015-08-23 NOTE — Progress Notes (Signed)
Patient ID: Karen Chambers, female   DOB: 1946/05/29, 69 y.o.   MRN: [id number] PCP: Roe Coombs  69 yo with history of HTN, hyperlipidemia, COPD, antiphospholipid antibody syndrome, and severe aortic stenosis returns for cardiology followup.  She continues to use oxygen at night and on occasion with exertion because of COPD.  She is using a walker now because of low back pain from degenerative disc disease.    She recently saw the PA in this office with dyspnea and chest pain.  Echo showed severe aortic stenosis with preserved LV systolic function.  I took her for right and left heart cath in 11/16.  This showed nonobstructive mild CAD and mildly elevated PCWP.    Currently, she is not very active.  Low back pain and knee pain are quite limiting for her. She uses a walker.  No dyspnea walking around the house currently.  The chest pain that she was having prior to seeing our PA has resolved.  No orthopnea/PND.    Labs (5/11): K 3.9, creatinine 1.2, HDL 68, LDL 79  Labs (9/11): K 3.1, creatinine 1.3  Labs (10/11): K 4.3, creatinine 1.2  Labs (11/12): BNP 164 Labs (3/14): K 3.5, creatinine 1.3, LDL 124 Labs (3/15): HCT 34.6 Labs (10/16): BNP 1262 Labs (11/16): K 4.1, creatinine 1.75  ECG: NSR, RBBB  Allergies (verified):  1) ! Allopurinol  2) ! Macrobid  3) ! Keflex  4) ! Sulfa   Past Medical History:  1. Hyperlipidemia  2. HTN: renal artery ultrasound (10/11) with no renal artery stenosis. ACEI cough.  3. Hypothyroidism  4. History of UTIs  5. 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 Followed by Dr. Jana Hakim. On permanent warfarin.  Goal INR has been 2.5-3.5. 6. Obesity  7. Gout: questionable. Uric acid normal when checked in 10/11.  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 Aortic stenosis: Echo (11/16) with EF 60-65%, moderate diastolic dysfunction, normal RV size and systolic function, severe aortic stenosis with AVA 0.9  cm^2 and mean gradient 58 mmHg, mild mitral stenosis.  11/16 LHC/RHC: 40% D1 stenosis otherwise no significant CAD; mean RA 4, PA 43/15 mean 31, mean PCWP 18, Fick CI 3.02, PVR 2.5 WU.   10. Low back pain  11. Rheumatoid arthritis on MTX  12. COPD on home oxygen  Family History:  Mother with "enlarged heart," probably died of MI at age 33. Father passed away young from cancer.   Social History:  Worked in accounts payable department, now retired. Prior smoker, quit 2000. Separated from husband. Has children.   ROS: All systems reviewed and negative except as per HPI.   Current Outpatient Prescriptions  Medication Sig Dispense Refill  . acetaminophen (TYLENOL ARTHRITIS PAIN) 650 MG CR tablet Take 650-1,300 mg by mouth 2 (two) times daily.    Marland Kitchen amLODipine (NORVASC) 10 MG tablet Take 1 tablet (10 mg total) by mouth every evening. 90 tablet 4  . aspirin 81 MG tablet Take 81 mg by mouth at bedtime.     Marland Kitchen atorvastatin (LIPITOR) 20 MG tablet TAKE 1 TABLET DAILY. 90 tablet 1  . febuxostat (ULORIC) 40 MG tablet Take 40 mg by mouth every morning.     Marland Kitchen levothyroxine (SYNTHROID, LEVOTHROID) 100 MCG tablet TAKE 1 TABLET DAILY 90 tablet 3  . metoprolol succinate (TOPROL-XL) 100 MG 24 hr tablet Take 1 tablet (100 mg total) by mouth 2 (two) times daily. Take with or immediately following a meal. 180 tablet 3  .  Multiple Vitamin (MULTIVITAMIN) capsule Take 1 capsule by mouth daily at 12 noon.     . Omega 3 1200 MG CAPS Take 1,500 mg by mouth daily at 12 noon. 1200mg  softgel + 300mg  softgel.    . tiotropium (SPIRIVA HANDIHALER) 18 MCG inhalation capsule INHALE ONE CAPSULE ONCE DAILY 30 capsule 6  . traMADol (ULTRAM) 50 MG tablet Take 100 mg by mouth 2 (two) times daily.     Marland Kitchen warfarin (COUMADIN) 5 MG tablet TAKE 1 TABLET DAILY OR AS DIRECTED BY THE COUMADIN CLINIC 105 tablet 3  . furosemide (LASIX) 20 MG tablet Take 1 tablet (20 mg total) by mouth daily. 30 tablet 3  . potassium chloride (K-DUR) 10 MEQ  tablet Take 1 tablet (10 mEq total) by mouth daily. 30 tablet 3   No current facility-administered medications for this visit.    BP 140/80 mmHg  Pulse 81  Ht 5' 2.5" (1.588 m)  Wt 154 lb 6.4 oz (70.035 kg)  BMI 27.77 kg/m2 General: NAD, obese Neck: JVP 8 cm, no thyromegaly or thyroid nodule.  Lungs: Clear to auscultation bilaterally with normal respiratory effort. CV: Nondisplaced PMI.  Heart regular S1/S2, no S3/S4, 3/6 crescendo-decrescendo murmur RUSB with obscuring of S2.  No edema.  No carotid bruit.  Normal pedal pulses.  Abdomen: Soft, nontender, no hepatosplenomegaly, no distention.  Neurologic: Alert and oriented x 3.  Psych: Normal affect. Extremities: No clubbing or cyanosis.   Assessment/Plan: 1. Antiphospholipid antibody syndrome: No thrombotic event in the last few years. She is on warfarin with goal INR 2.5-3.5 and also on ASA 81.  2. HTN: BP high.  I held her losartan prior to cath with elevated creatinine.  I am going to start her back on losartan 50 mg daily, will need to check BMET in 1 week.     3. Hyperlipidemia: She is on atorvastatin.   4. COPD: On home oxygen at night and as needed.  5. Aortic stenosis: Severe with mean gradient 58 mmHg and AVA 0.9 cm^2.  LHC/RHC done showing mildly elevated PCWP and no significant obstructive coronary disease.  She will be a difficult candidate for SAVR given her frailty and comorbidities (limited mobility, COPD).  I will have her evaluated for TAVR.   6. Chronic diastolic CHF: She is likely mildly volume overloaded.  PCWP elevated on recent RHC.  I am going to have her go on Lasix 20 mg daily with KCl 10 mEq daily.   Loralie Champagne 08/23/2015

## 2015-08-23 NOTE — Progress Notes (Signed)
Chief Complaint  Patient presents with  . 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 She is followed by Dr. Aundra Dubin in cardiology and Dr. Chase Caller in Pulmonary clinic. She has had a murmur for years. Recent CHF exacerbation leading to echo on 07/30/15 with normal LV systolic function, severe aortic stenosis with mean gradient of 58 mm Hg. Cardiac cath 08/09/15 with mild non-obstructive CAD. (see below). She is overall doing well but over the last three months she describes chest pressure with ambulation. She has baseline shortness of breath with minimal exertion. This is unchanged. She is on home supplemental oxygen therapy. No dizziness, near syncope or syncope. She is limited by her knee pain and dyspnea. She is retired from [employer]. She is here today with her husband and daughter in law.   Primary Care Physician: Aura Dials, PA-C  Cardiology: Dr. Aundra Dubin   Past Medical History  Diagnosis Date  . Other and unspecified hyperlipidemia 401.9  . Hypothyroidism   . Obesity, unspecified   . Gout   . Low back pain     buldging disc ,and herniated disc  . Rheumatoid arthritis(714.0)   . Cough 11/12/2010  . Hypertension   . Coagulopathy ([place])   . COPD (chronic obstructive pulmonary disease) ([place])   . Complication of anesthesia     slow awaken from anesthesia  . Von Willebrand disease ([place])     Dr. Jana Hakim follows  . Aortic stenosis   . CAD (coronary artery disease)   . TIA (transient ischemic attack)     Past Surgical History  Procedure Laterality Date  . Cholecystectomy    . Total abdominal hysterectomy    . Stapedectomy Bilateral     right ear hearing aid  . Esophagogastroduodenoscopy (egd) with propofol N/A  06/23/2014    Procedure: ESOPHAGOGASTRODUODENOSCOPY (EGD) WITH PROPOFOL;  Surgeon: Jerene Bears, MD;  Location: [facility];  Service: Gastroenterology;  Laterality: N/A;  . Colonoscopy with propofol N/A 06/23/2014    Procedure: COLONOSCOPY WITH PROPOFOL;  Surgeon: Jerene Bears, MD;  Location: [facility];  Service: Gastroenterology;  Laterality: N/A;  . Cardiac catheterization N/A 08/09/2015    Procedure: Right/Left Heart Cath and Coronary Angiography;  Surgeon: Larey Dresser, MD;  Location: [facility] [place] [facility];  Service: Cardiovascular;  Laterality: N/A;    Current Outpatient Prescriptions  Medication Sig Dispense Refill  . acetaminophen (TYLENOL ARTHRITIS PAIN) 650 MG CR tablet Take 650-1,300 mg by mouth 2 (two) times daily.    Marland Kitchen amLODipine (NORVASC) 10 MG tablet Take 1 tablet (10 mg total) by mouth every evening. 90 tablet 4  . aspirin 81 MG tablet Take 81 mg by mouth at bedtime.     Marland Kitchen atorvastatin (LIPITOR) 20 MG tablet TAKE 1 TABLET DAILY. 90 tablet 1  . febuxostat (ULORIC) 40 MG tablet Take 40 mg by mouth every morning.     . furosemide (LASIX) 20 MG tablet Take 1 tablet (20 mg total) by mouth daily. 30 tablet 3  . levothyroxine (SYNTHROID, LEVOTHROID) 100 MCG tablet TAKE 1 TABLET DAILY 90 tablet 3  . metoprolol succinate (TOPROL-XL) 100 MG 24 hr tablet Take 1 tablet (100 mg total) by mouth 2 (two) times daily. Take with or  immediately following a meal. 180 tablet 3  . Multiple Vitamin (MULTIVITAMIN) capsule Take 1 capsule by mouth daily at 12 noon.     . Omega 3 1200 MG CAPS Take 1,500 mg by mouth daily at 12 noon. 1200mg  softgel + 300mg  softgel.    . potassium chloride (K-DUR) 10 MEQ tablet Take 1 tablet (10 mEq total) by mouth daily. 30 tablet 3  . tiotropium (SPIRIVA HANDIHALER) 18 MCG inhalation capsule INHALE ONE CAPSULE ONCE DAILY 30 capsule 6  . traMADol (ULTRAM) 50 MG tablet Take 100 mg by mouth 2 (two) times daily.     Marland Kitchen warfarin (COUMADIN) 5 MG tablet TAKE 1 TABLET DAILY OR  AS DIRECTED BY THE COUMADIN CLINIC 105 tablet 3   No current facility-administered medications for this visit.    Allergies  Allergen Reactions  . Allopurinol Itching, Nausea Only, Rash and Other (See Comments)    Top of head to tip of toes with rash; aches all over  . Cephalexin Other (See Comments)    Severe case of thrush in mouth/tongue.  Tongue raw and hard and swollen.  . Nitrofurantoin Hives, Itching, Nausea And Vomiting, Rash and Other (See Comments)    Body aches, like I have the flu  . Ace Inhibitors Other (See Comments)    cough  . Sulfonamide Derivatives Hives, Itching and Rash    Social History   Social History  . Marital Status: Legally Separated    Spouse Name: N/A  . Number of Children: 2  . Years of Education: N/A   Occupational History  . Retired-Accounting Dept    Social History Main Topics  . Smoking status: Former Smoker -- 1.00 packs/day for 36 years    Types: Cigarettes    Quit date: 07/05/1999  . Smokeless tobacco: Never Used     Comment: 1ppd x 36 years  . Alcohol Use: No  . Drug Use: No  . Sexual Activity: Not on file   Other Topics Concern  . Not on file   Social History Narrative    Family History  Problem Relation Age of Onset  . Cancer Father   . Heart disease Mother   . Heart attack Mother   . Hypertension Sister   . Stroke Neg Hx     Review of Systems:  As stated in the HPI and otherwise negative.   BP 122/80 mmHg  Pulse 86  Ht 5' 2.5" (1.588 m)  Wt 155 lb 9.6 oz (70.58 kg)  BMI 27.99 kg/m2  SpO2 96%  Physical Examination: General: Well developed, well nourished, NAD HEENT: OP clear, mucus membranes moist SKIN: warm, dry. No rashes. Neuro: No focal deficits Musculoskeletal: Muscle strength 5/5 all ext Psychiatric: Mood and affect normal Neck: No JVD, no carotid bruits, no thyromegaly, no lymphadenopathy. Lungs:Clear bilaterally, no wheezes, rhonci, crackles Cardiovascular: Regular rate and rhythm. Loud harsh  systolic murmur. No gallops or rubs. Abdomen:Soft. Bowel sounds present. Non-tender.  Extremities: No lower extremity edema. Pulses are 2 + in the bilateral DP/PT.  Echo 07/30/15: Left ventricle: The cavity size was normal. Wall thickness was increased in a pattern of mild LVH. Systolic function was normal. The estimated ejection fraction was in the range of 60% to 65%. Wall motion was normal; there were no regional wall motion abnormalities. Features are consistent with a pseudonormal left ventricular filling pattern, with concomitant abnormal relaxation and increased filling pressure (grade 2 diastolic dysfunction). - Aortic valve: Trileaflet; severely calcified leaflets. There was severe stenosis. Mean gradient (S):  58 mm Hg. Valve area (VTI): 1.15 cm^2. Valve area (Vmax): 0.94 cm^2. - Mitral valve: Moderately to severely calcified annulus. Moderately calcified leaflets . The findings are consistent with mild stenosis. There was trivial regurgitation. Mean gradient (D): 10 mm Hg. Valve area by pressure half-time: 2.61 cm^2. - Left atrium: The atrium was moderately dilated. - Right ventricle: The cavity size was normal. Systolic function was normal. - Pulmonary arteries: No complete TR doppler jet so unable to estimate PA systolic pressure. - Inferior vena cava: The vessel was normal in size. The respirophasic diameter changes were in the normal range (= 50%), consistent with normal central venous pressure.  Impressions:  - Normal LV size with mild LV hypertrophy. EF 60-65%. Moderate diastolic dysfunction. Normal RV size and systolic function. Severe aortic stenosis by mean gradient, AVA 1.1 cm^2 by VTI, 0.9 cm^2 by Vmax. I think that the AS is severe. The mitral valve and annulus are heavily calcified. Mean gradient across the valve is 10 mmHg but MVA by PHT is 2.6 cm^2. I do not think that the mitral stenosis is more than  mild.  Cardiac cath 08/09/15: Dominance: Right   Left Main  No significant disease.     Left Anterior Descending  Luminal irregularities in the LAD. 40% proximal D1 stenosis.     Left Circumflex  Luminal irregularities.     Right Coronary Artery  Luminal irregularities.       Right Heart Pressures RHC Procedural Findings: Hemodynamics (mmHg) RA mean 4 RV 45/5 PA 43/15, mean 31 PCWP mean 18  Oxygen saturations: PA 65% AO 98%  Cardiac Output (Fick) 5.14  Cardiac Index (Fick) 3.02  PVR 2.5 WU     EKG:  EKG is not ordered today. EKG from 08/22/15 with NSR, RBBB  Recent Labs: 07/10/2015: Pro B Natriuretic peptide (BNP) 1262.0* 08/02/2015: Hemoglobin 9.8*; Platelets 341 08/22/2015: BUN 30*; Creat 1.74*; Potassium 4.4; Sodium 139     Wt Readings from Last 3 Encounters:  08/24/15 155 lb 9.6 oz (70.58 kg)  08/22/15 154 lb 6.4 oz (70.035 kg)  08/09/15 153 lb (69.4 kg)     Other studies Reviewed:  Additional studies/ records that were reviewed today include: I have reviewed the echo and cath images. Review of the above records demonstrates:severe AS, minimal CAD  STS Risk:  Risk of Mortality: 3.011%  Morbidity or Mortality: 20.054%  Long Length of Stay: 8.499%  Short Length of Stay: 29.44%  Permanent Stroke: 1.597%  Prolonged Ventilation: 13.128%  DSW Infection: 0.253%  Renal Failure: 4.569%  Reoperation: 8.209%    Assessment and Plan:   1. Severe aortic valve stenosis: She has stage D symptomatic severe aortic valve stenosis. She has chest pressure and dyspnea with recent CHF, likely due to her aortic stenosis. I have reviewed the etiology of aortic valve stenosis including symptoms and natural progression. I think aortic valve replacement is indicated. She is not an optimal candidate for traditional AVR given her COPD and severe arthritis limiting mobility. I think she would be a good candidate for TAVR. I have reviewed the TAVR procedure in detail.  She would like to proceed with planning. I will refer her to see one of the CT surgeons on our TAVR team. After her visit there, we will plan her Cardiac CT and CTA chest/abd/pelvis. She will also need PFTs.   Current medicines are reviewed at length with the patient today.  The patient does not have concerns regarding medicines.  The following changes have been made:  no change  Labs/ tests ordered today include:  No orders of the defined types were placed in this encounter.    Disposition:   FU with me after her TAVR  Signed, Lauree Chandler, MD 08/24/2015 2:30 PM    [address], [place], [place]  [address] Phone: [phone]; Fax: [phone]

## 2015-08-24 ENCOUNTER — Encounter: Payer: Self-pay | Admitting: Cardiovascular Disease

## 2015-08-24 ENCOUNTER — Ambulatory Visit (INDEPENDENT_AMBULATORY_CARE_PROVIDER_SITE_OTHER): Payer: Medicare HMO | Admitting: Cardiovascular Disease

## 2015-08-24 VITALS — BP 122/80 | HR 86 | Ht 62.5 in | Wt 155.6 lb

## 2015-08-24 DIAGNOSIS — I350 Nonrheumatic aortic (valve) stenosis: Secondary | ICD-10-CM | POA: Diagnosis not present

## 2015-08-24 NOTE — Patient Instructions (Signed)
Medication Instructions:  Your physician recommends that you continue on your current medications as directed. Please refer to the Current Medication list given to you today.   Labwork: none  Testing/Procedures: none  Follow-Up: We will make referral to surgeon and their office will contact you with appointment information.  Follow up with Dr. Aundra Dubin as planned.   Any Other Special Instructions Will Be Listed Below (If Applicable).     If you need a refill on your cardiac medications before your next appointment, please call your pharmacy.

## 2015-08-27 ENCOUNTER — Telehealth: Payer: Self-pay | Admitting: *Deleted

## 2015-08-27 ENCOUNTER — Telehealth: Payer: Self-pay | Admitting: Cardiology

## 2015-08-27 MED ORDER — LOSARTAN POTASSIUM 50 MG PO TABS: 50.0000 mg | ORAL_TABLET | Freq: Every day | ORAL | Status: DC

## 2015-08-27 NOTE — Telephone Encounter (Signed)
New message    Patient calling back to speak with nurse regarding medication  - 50 mg or  100 mg     Pt c/o medication issue:  1. Name of Medication: losartan  50 mg   2. How are you currently taking this medication (dosage and times per day)?  Once a day   3. Are you having a reaction (difficulty breathing--STAT)? No   4. What is your medication issue? Patient has 100 mg at home  - was told to restart back at  50 mg / should a new prescription be called in

## 2015-08-27 NOTE — Telephone Encounter (Signed)
Notes Recorded by Larey Dresser, MD on 08/23/2015 at 11:31 PM She can go back on losartan 50 mg daily but needs BMET in 1 week.

## 2015-08-27 NOTE — Telephone Encounter (Signed)
Pt requesting prescription for losartan 50mg , she does not think she will be able to split her 50mg  tablet evenly in half.

## 2015-09-04 ENCOUNTER — Institutional Professional Consult (permissible substitution) (INDEPENDENT_AMBULATORY_CARE_PROVIDER_SITE_OTHER): Payer: Medicare HMO | Admitting: Surgery

## 2015-09-04 ENCOUNTER — Encounter: Payer: Self-pay | Admitting: Surgery

## 2015-09-04 VITALS — BP 149/88 | HR 86 | Resp 20 | Ht 62.5 in | Wt 155.0 lb

## 2015-09-04 DIAGNOSIS — I350 Nonrheumatic aortic (valve) stenosis: Secondary | ICD-10-CM

## 2015-09-05 ENCOUNTER — Other Ambulatory Visit: Payer: Self-pay | Admitting: Cardiology

## 2015-09-05 ENCOUNTER — Encounter: Payer: Self-pay | Admitting: Surgery

## 2015-09-05 ENCOUNTER — Other Ambulatory Visit: Payer: Self-pay | Admitting: *Deleted

## 2015-09-05 ENCOUNTER — Other Ambulatory Visit (HOSPITAL_BASED_OUTPATIENT_CLINIC_OR_DEPARTMENT_OTHER): Payer: Medicare HMO

## 2015-09-05 DIAGNOSIS — N289 Disorder of kidney and ureter, unspecified: Secondary | ICD-10-CM

## 2015-09-05 DIAGNOSIS — I350 Nonrheumatic aortic (valve) stenosis: Secondary | ICD-10-CM

## 2015-09-05 DIAGNOSIS — D689 Coagulation defect, unspecified: Secondary | ICD-10-CM

## 2015-09-05 LAB — PROTIME-INR
INR: 2.7 (ref 2.00–3.50)
PROTIME: 32.4 s — AB (ref 10.6–13.4)

## 2015-09-05 NOTE — Progress Notes (Signed)
Patient ID: Karen Chambers, female   DOB: Mar 30, 1946, 69 y.o.   MRN: [id number]  [facility] CONSULTATION REPORT  Referring Provider is Larey Dresser, MD PCP is Aura Dials, PA-C  Chief Complaint  Patient presents with  . 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 She reports a history of a heart murmur since she was young and has had echos in the past without any abnormality. She had a COPD flare in October and was treated with prednisone. She continued to have shortness of breath and a CXR showed pulmonary edema. Her BNP was elevated and her murmur was felt to be different. She had an echo on 07/30/2015 that showed a trileaflet aortic valve with severely calcified leaflets and severe AS with a mean gradient of 58 mm Hg and a peak gradient of 108 mm Hg. The mitral valve and annulus are heavily calcified with a mean gradient of 10 mm Hg and a MVA by PHRT of 2.6 cm2 consistent with mild MS. She underwent cardiac cat on 08/09/2015 that showed mild non-obstructive CAD with mildly elevated left heart pressures and mild pulmonary hypertension.  She lives at home with her husband and is here today with her husband, son and daughter-in-law. She is independent with ADL's but is fairly sedentary according to her family. She is limited by her shortness of breath with mild activity and arthritic pain in her hands, knees and back.    Past Medical History  Diagnosis Date  . Other and unspecified hyperlipidemia 401.9  . Hypothyroidism   . Obesity, unspecified    . Gout   . Low back pain     buldging disc ,and herniated disc  . Rheumatoid arthritis(714.0)   . Cough 11/12/2010  . Hypertension   . Coagulopathy ([place])   . COPD (chronic obstructive pulmonary disease) ([place])   . Complication of anesthesia     slow awaken from anesthesia  . Von Willebrand disease ([place])     Dr. Jana Hakim follows  . Aortic stenosis   . CAD (coronary artery disease)   . TIA (transient ischemic attack)     Past Surgical History  Procedure Laterality Date  . Cholecystectomy    . Total abdominal hysterectomy    . Stapedectomy Bilateral     right ear hearing aid  . Esophagogastroduodenoscopy (egd) with propofol N/A 06/23/2014    Procedure: ESOPHAGOGASTRODUODENOSCOPY (EGD) WITH PROPOFOL;  Surgeon: Jerene Bears, MD;  Location: [facility];  Service: Gastroenterology;  Laterality: N/A;  . Colonoscopy with propofol N/A 06/23/2014    Procedure: COLONOSCOPY WITH PROPOFOL;  Surgeon: Jerene Bears, MD;  Location: [facility];  Service: Gastroenterology;  Laterality: N/A;  . Cardiac catheterization N/A 08/09/2015    Procedure: Right/Left Heart Cath and Coronary Angiography;  Surgeon: Larey Dresser, MD;  Location: [facility];  Service: Cardiovascular;  Laterality: N/A;    Family History  Problem Relation Age of Onset  . Cancer Father   . Heart disease Mother   . Heart attack Mother   . Hypertension Sister   . Stroke Neg  Hx     Social History   Social History  . Marital Status: Legally Separated    Spouse Name: N/A  . Number of Children: 2  . Years of Education: N/A   Occupational History  . Retired-Accounting Dept    Social History Main Topics  . Smoking status: Former Smoker -- 1.00 packs/day for 36 years    Types: Cigarettes    Quit date: 07/05/1999  . Smokeless tobacco: Never Used     Comment: 1ppd x 36 years  . Alcohol Use: No  . Drug Use: No  . Sexual Activity: Not on file   Other Topics Concern  . Not on file   Social History Narrative      Current Outpatient Prescriptions  Medication Sig Dispense Refill  . acetaminophen (TYLENOL ARTHRITIS PAIN) 650 MG CR tablet Take 650-1,300 mg by mouth 2 (two) times daily.    Marland Kitchen amLODipine (NORVASC) 10 MG tablet Take 1 tablet (10 mg total) by mouth every evening. 90 tablet 4  . aspirin 81 MG tablet Take 81 mg by mouth at bedtime.     Marland Kitchen atorvastatin (LIPITOR) 20 MG tablet TAKE 1 TABLET DAILY. 90 tablet 1  . febuxostat (ULORIC) 40 MG tablet Take 40 mg by mouth every morning.     . furosemide (LASIX) 20 MG tablet Take 1 tablet (20 mg total) by mouth daily. 30 tablet 3  . levothyroxine (SYNTHROID, LEVOTHROID) 100 MCG tablet TAKE 1 TABLET DAILY 90 tablet 3  . losartan (COZAAR) 50 MG tablet Take 1 tablet (50 mg total) by mouth daily. 30 tablet 3  . metoprolol succinate (TOPROL-XL) 100 MG 24 hr tablet Take 1 tablet (100 mg total) by mouth 2 (two) times daily. Take with or immediately following a meal. 180 tablet 3  . Multiple Vitamin (MULTIVITAMIN) capsule Take 1 capsule by mouth daily at 12 noon.     . Omega 3 1200 MG CAPS Take 1,500 mg by mouth daily at 12 noon. 1200mg  softgel + 300mg  softgel.    . potassium chloride (K-DUR) 10 MEQ tablet Take 1 tablet (10 mEq total) by mouth daily. 30 tablet 3  . tiotropium (SPIRIVA HANDIHALER) 18 MCG inhalation capsule INHALE ONE CAPSULE ONCE DAILY 30 capsule 6  . traMADol (ULTRAM) 50 MG tablet Take 100 mg by mouth 2 (two) times daily.     Marland Kitchen warfarin (COUMADIN) 5 MG tablet TAKE 1 TABLET DAILY OR AS DIRECTED BY THE COUMADIN CLINIC 105 tablet 3   No current facility-administered medications for this visit.    Allergies  Allergen Reactions  . Allopurinol Itching, Nausea Only, Rash and Other (See Comments)    Top of head to tip of toes with rash; aches all over  . Cephalexin Other (See Comments)    Severe case of thrush in mouth/tongue.  Tongue raw and hard and swollen.  . Nitrofurantoin Hives, Itching, Nausea And Vomiting, Rash and Other (See Comments)     Body aches, like I have the flu  . Ace Inhibitors Other (See Comments)    cough  . Sulfonamide Derivatives Hives, Itching and Rash      Review of Systems:   General:  normal appetite, normal energy, no weight gain, no weight loss, no fever  Cardiac:  mild chest pressure with exertion, no chest pain at rest, moderate SOB with mild exertion, no resting SOB, no PND, no orthopnea,  no palpitations,  no arrhythmia,  no atrial fibrillation,no LE edema,  no dizzy spells,  p syncope  Respiratory:  chronic shortness of breath, uses home oxygen, no productive cough, no dry cough, no bronchitis, has wheezing, no hemoptysis, no asthma, no pain with inspiration or cough, no sleep apnea, no CPAP at night  GI:   no difficulty swallowing, no reflux, n frequent heartburn, no hiatal hernia, no abdominal pain, no constipation, no diarrhea, no hematochezia, no hematemesis, no melena  GU:   no dysuria,  no frequency, no urinary tract infection, no hematuria, no kidney stones, no kidney disease  Vascular:  no pain suggestive of claudication, no pain in feet, no leg cramps, no varicose veins, no DVT, no non-healing foot ulcer  Neuro:   previous stroke with numbness in fingers on right hand and right facial droop resolved, no TIA's, no seizures, no headaches, no temporary blindness one eye,  no slurred speech, no peripheral neuropathy, no chronic pain, no instability of gait, no memory/cognitive dysfunction  Musculoskeletal: rheumatoid arthritis, has joint swelling, no myalgias, no difficulty walking, decreased mobility   Skin:   no rash, no itching, no skin infections, no pressure sores or ulcerations  Psych:   no anxiety, no depression, no nervousness, no unusual recent stress  Eyes:   no blurry vision, has floaters, no recent vision changes,  wears glasses   ENT:   has hearing loss, no loose or painful teeth, wears upper dentures, last saw dentist this year  Hematologic:  has easy bruising, no abnormal bleeding,  has clotting disorder, no frequent epistaxis  Endocrine:  no diabetes, does not check CBG's at home      Physical Exam:   BP 149/88 mmHg  Pulse 86  Resp 20  Ht 5' 2.5" (1.588 m)  Wt 155 lb (70.308 kg)  BMI 27.88 kg/m2  SpO2 93%  General:  Elderly,   well-appearing woman in no distress  HEENT:  Unremarkable , NCAT, PERLA, EOMI, oropharynx clear  Neck:   no JVD, no bruits, no adenopathy or thyromegaly  Chest:   clear to auscultation, symmetrical but decreased breath sounds throughout, no wheezes, no rhonchi   CV:   RRR, grade III/VI crescendo/decrescendo murmur heard best at RUSB,  no diastolic murmur  Abdomen:  soft, non-tender, no masses or organomegaly  Extremities:  warm, well-perfused, pulses palpable dp and pt, no LE edema  Rectal/GU  Deferred  Neuro:   Grossly non-focal and symmetrical throughout  Skin:   Clean and dry, no rashes, no breakdown   Diagnostic Tests:  [facility]            [address], [place] [address]              [phone]  ------------------------------------------------------------------- Transthoracic Echocardiography  Patient:  Jaleeah, Weerts MR #:    [id number] Study Date: 07/30/2015 Gender:   F Age:    55 Height:   157.5 cm Weight:   70.8 kg BSA:    1.78 m^2 Pt. Status: Room:  ORDERING   Parrett, Tammy S REFERRING  Parrett, Tammy S ATTENDING  Loralie Champagne, M.D. SONOGRAPHER Wyatt Mage, RDCS PERFORMING  [facility], Outpatient  cc:  ------------------------------------------------------------------- LV EF: 60% -  65%  ------------------------------------------------------------------- Indications:   CHF (I50.9).  ------------------------------------------------------------------- History:  PMH:  Dyspnea. Chronic obstructive pulmonary disease. Risk factors: PNA. Murmur. Hypoxemia. Cough. Anemia. Current tobacco use.  Hypertension. Dyslipidemia.  ------------------------------------------------------------------- Study Conclusions  - Left ventricle: The cavity size was normal. Wall thickness was increased in a pattern  of mild LVH. Systolic function was normal. The estimated ejection fraction was in the range of 60% to 65%. Wall motion was normal; there were no regional wall motion abnormalities. Features are consistent with a pseudonormal left ventricular filling pattern, with concomitant abnormal relaxation and increased filling pressure (grade 2 diastolic dysfunction). - Aortic valve: Trileaflet; severely calcified leaflets. There was severe stenosis. Mean gradient (S): 58 mm Hg. Valve area (VTI): 1.15 cm^2. Valve area (Vmax): 0.94 cm^2. - Mitral valve: Moderately to severely calcified annulus. Moderately calcified leaflets . The findings are consistent with mild stenosis. There was trivial regurgitation. Mean gradient (D): 10 mm Hg. Valve area by pressure half-time: 2.61 cm^2. - Left atrium: The atrium was moderately dilated. - Right ventricle: The cavity size was normal. Systolic function was normal. - Pulmonary arteries: No complete TR doppler jet so unable to estimate PA systolic pressure. - Inferior vena cava: The vessel was normal in size. The respirophasic diameter changes were in the normal range (= 50%), consistent with normal central venous pressure.  Impressions:  - Normal LV size with mild LV hypertrophy. EF 60-65%. Moderate diastolic dysfunction. Normal RV size and systolic function. Severe aortic stenosis by mean gradient, AVA 1.1 cm^2 by VTI, 0.9 cm^2 by Vmax. I think that the AS is severe. The mitral valve and annulus are heavily calcified. Mean gradient across the valve is 10 mmHg but MVA by PHT is 2.6 cm^2. I do not think that the mitral stenosis is more than  mild.  ------------------------------------------------------------------- Labs, prior tests, procedures, and surgery: Transthoracic echocardiography (05/14/2010).   EF was 60%.  Transthoracic echocardiography. M-mode, complete 2D, spectral Doppler, and color Doppler. Birthdate: Patient birthdate: November 24, 1945. Age: Patient is 69 yr old. Sex: Gender: female. BMI: 28.5 kg/m^2. Blood pressure:   148/80 Patient status: Outpatient. Study date: Study date: 07/30/2015. Study time: 09:41 AM. Location: [facility]  -------------------------------------------------------------------  ------------------------------------------------------------------- Left ventricle: The cavity size was normal. Wall thickness was increased in a pattern of mild LVH. Systolic function was normal. The estimated ejection fraction was in the range of 60% to 65%. Wall motion was normal; there were no regional wall motion abnormalities. Features are consistent with a pseudonormal left ventricular filling pattern, with concomitant abnormal relaxation and increased filling pressure (grade 2 diastolic dysfunction).  ------------------------------------------------------------------- Aortic valve:  Trileaflet; severely calcified leaflets. Doppler: There was severe stenosis.  There was no regurgitation.  VTI ratio of LVOT to aortic valve: 0.36. Valve area (VTI): 1.15 cm^2. Indexed valve area (VTI): 0.65 cm^2/m^2. Peak velocity ratio of LVOT to aortic valve: 0.33. Valve area (Vmax): 0.94 cm^2. Indexed valve area (Vmax): 0.58 cm^2/m^2. Mean velocity ratio of LVOT to aortic valve: 0.33. Valve area (Vmean): 1.04 cm^2. Indexed valve area (Vmean): 0.58 cm^2/m^2.  Mean gradient (S): 58 mm Hg. Peak gradient (S): 108 mm Hg.  ------------------------------------------------------------------- Aorta: Aortic root: The aortic root was normal in size. Ascending aorta: The ascending aorta was normal in  size.  ------------------------------------------------------------------- Mitral valve:  Moderately to severely calcified annulus. Moderately calcified leaflets . Doppler:  The findings are consistent with mild stenosis.  There was trivial regurgitation. Valve area by pressure half-time: 2.61 cm^2. Indexed valve area by pressure half-time: 1.47 cm^2/m^2. Indexed valve area by continuity equation (using LVOT flow): 1.32 cm^2/m^2.  Mean gradient (D): 10 mm Hg. Peak gradient (D): 19 mm Hg.  ------------------------------------------------------------------- Left atrium: The atrium was moderately dilated.  ------------------------------------------------------------------- Right ventricle: The cavity size was normal. Systolic function was normal.  ------------------------------------------------------------------- Pulmonic valve:  Structurally normal valve.  Cusp separation was normal. Doppler: Transvalvular velocity was within the normal range. There was no regurgitation.  ------------------------------------------------------------------- Tricuspid valve:  Doppler: There was no significant regurgitation.  ------------------------------------------------------------------- Pulmonary artery:  No complete TR doppler jet so unable to estimate PA systolic pressure.  ------------------------------------------------------------------- Right atrium: The atrium was normal in size.  ------------------------------------------------------------------- Pericardium: There was no pericardial effusion.  ------------------------------------------------------------------- Systemic veins: Inferior vena cava: The vessel was normal in size. The respirophasic diameter changes were in the normal range (= 50%), consistent with normal central venous pressure.  ------------------------------------------------------------------- Measurements  Left ventricle               Value      Reference LV ID, ED, PLAX chordal     (L)   39.2  mm    43 - 52 LV ID, ES, PLAX chordal     (L)   21.2  mm    23 - 38 LV fx shortening, PLAX chordal      46   %    >=29 LV PW thickness, ED           11.9  mm    --------- IVS/LV PW ratio, ED       (H)   1.34      <=1.3 Stroke volume, 2D            136  ml    --------- Stroke volume/bsa, 2D          76   ml/m^2  --------- LV ejection fraction, 1-p A4C      83   %    --------- LV end-diastolic volume, 2-p       44   ml    --------- LV end-systolic volume, 2-p       11   ml    --------- LV ejection fraction, 2-p        75   %    --------- Stroke volume, 2-p            33   ml    --------- LV end-diastolic volume/bsa, 2-p     25   ml/m^2  --------- LV end-systolic volume/bsa, 2-p     6   ml/m^2  --------- Stroke volume/bsa, 2-p          18.5  ml/m^2  --------- LV e&', lateral              9.12  cm/s   --------- LV E/e&', lateral             24.15      --------- LV e&', medial              5.97  cm/s   --------- LV E/e&', medial             36.9      --------- LV e&', average              7.55  cm/s   --------- LV E/e&', average             29.2      ---------  Ventricular septum            Value      Reference IVS thickness, ED            15.9  mm    ---------  LVOT                   Value  Reference LVOT ID, S                20   mm    --------- LVOT area                3.14  cm^2   --------- LVOT peak velocity, S          172  cm/s   --------- LVOT mean velocity, S          119  cm/s    --------- LVOT VTI, S               43.4  cm    --------- LVOT peak gradient, S          12   mm Hg  ---------  Aortic valve               Value      Reference Aortic valve peak velocity, S      519  cm/s   --------- Aortic valve mean velocity, S      361  cm/s   --------- Aortic valve VTI, S           119  cm    --------- Aortic mean gradient, S         58   mm Hg  --------- Aortic peak gradient, S         108  mm Hg  --------- VTI ratio, LVOT/AV            0.36      --------- Aortic valve area, VTI          1.15  cm^2   --------- Aortic valve area/bsa, VTI        0.65  cm^2/m^2 --------- Velocity ratio, peak, LVOT/AV      0.33      --------- Aortic valve area, peak velocity     1.04  cm^2   --------- Aortic valve area/bsa, peak       0.58  cm^2/m^2 --------- velocity Velocity ratio, mean, LVOT/AV      0.33      --------- Aortic valve area, mean velocity     1.04  cm^2   --------- Aortic valve area/bsa, mean       0.58  cm^2/m^2 --------- velocity  Aorta                  Value      Reference Aortic root ID, ED            27   mm    ---------  Left atrium               Value      Reference LA ID, A-P, ES              44   mm    --------- LA ID/bsa, A-P          (H)   2.47  cm/m^2  <=2.2 LA volume, S               90   ml    --------- LA volume/bsa, S             50.5  ml/m^2  --------- LA volume, ES, 1-p A4C          92   ml    --------- LA volume/bsa, ES, 1-p A4C        51.6  ml/m^2  --------- LA volume, ES, 1-p  A2C          89   ml    --------- LA volume/bsa, ES, 1-p A2C        50    ml/m^2  ---------  Mitral valve               Value      Reference Mitral E-wave peak velocity       220.29 cm/s   --------- Mitral A-wave peak velocity       172  cm/s   --------- Mitral mean velocity, D         150  cm/s   --------- Mitral deceleration slope        758.33 cm/s^2  --------- Mitral deceleration time     (H)   290  ms    150 - 230 Mitral pressure half-time        84   ms    --------- Mitral mean gradient, D         10   mm Hg  --------- Mitral peak gradient, D         19   mm Hg  --------- Mitral E/A ratio, peak          1.28      --------- Mitral valve area, PHT, DP        2.61  cm^2   --------- Mitral valve area/bsa, PHT, DP      1.47  cm^2/m^2 --------- Mitral valve area/bsa, LVOT       1.32  cm^2/m^2 --------- continuity Mitral annulus VTI, D          58   cm    ---------  Right ventricle             Value      Reference RV s&', lateral, S            14.9  cm/s   ---------  Legend: (L) and (H) mark values outside specified reference range.  ------------------------------------------------------------------- Prepared and Electronically Authenticated by  Loralie Champagne, M.D. 2016-11-07T13:18:02  Larey Dresser, MD (Primary)      Procedures    Right/Left Heart Cath and Coronary Angiography    Conclusion    1. Mildly elevated left heart filling pressure.  2. 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  3. Nonobstructive mild CAD.   She will undergo evaluation for TAVR.  She will need to resume Lovenox injections tonight.   CKD: 30 cc contrast used. Will hydrate post-procedure.     Technique and Indications    Procedure: Right Heart Cath, Selective Coronary  Angiography  Indication: Severe aortic stenosis.   Procedural Details: The right radial and brachial areas were prepped, draped, and anesthetized with 1% lidocaine. There was a pre-existing peripheral IV in the right brachial area that was replaced with a 82F venous sheath. A Swan-Ganz catheter was used for the right heart catheterization. Standard protocol was followed for recording of right heart pressures and sampling of oxygen saturations. Fick cardiac output was calculated. The right radial artery was entered using modified Seldinger technique and a 82F sheath was placed. The patient received weight-based IV heparin and 3 mg IA verapamil. Standard Judkins catheters were used for selective coronary angiography. There were no immediate procedural complications. The patient was transferred to the post catheterization recovery area for further monitoring.  Estimated blood loss <50 mL.    Coronary Findings    Dominance: Right   Left  Main  No significant disease.     Left Anterior Descending  Luminal irregularities in the LAD. 40% proximal D1 stenosis.     Left Circumflex  Luminal irregularities.     Right Coronary Artery  Luminal irregularities.       Right Heart Pressures [facility] Procedural Findings: Hemodynamics (mmHg) RA mean 4 RV 45/5 PA 43/15, mean 31 PCWP mean 18  Oxygen saturations: PA 65% AO 98%  Cardiac Output (Fick) 5.14  Cardiac Index (Fick) 3.02  PVR 2.5 WU    Wall Motion    Not done, CKD and known severe AS.           Implants    Name ID Temporary Type Supply   No information to display    PACS Images    Show images for Cardiac catheterization     Link to Procedure Log    Procedure Log      Hemo Data       Most Recent Value   Fick Cardiac Output  5.14 L/min   Fick Cardiac Output Index  3.02 (L/min)/BSA   RA A Wave  7 mmHg   RA V Wave  4 mmHg   RA Mean  4 mmHg   RV Systolic Pressure  45 mmHg   RV Diastolic Pressure  -1  mmHg   RV EDP  5 mmHg   PA Systolic Pressure  43 mmHg   PA Diastolic Pressure  15 mmHg   PA Mean  31 mmHg   PW A Wave  22 mmHg   PW V Wave  17 mmHg   PW Mean  18 mmHg   AO Systolic Pressure  123456 mmHg   AO Diastolic Pressure  67 mmHg   AO Mean  89 mmHg   QP/QS  1   TPVR Index  10.25 HRUI   TSVR Index  29.43 HRUI   PVR SVR Ratio  0.15   TPVR/TSVR Ratio  0.35     STS Risk:  Risk of Mortality: 3.011%  Morbidity or Mortality: 20.054%  Long Length of Stay: 8.499%  Short Length of Stay: 29.44%  Permanent Stroke: 1.597%  Prolonged Ventilation: 13.128%  DSW Infection: 0.253%  Renal Failure: 4.569%  Reoperation: 8.209%   Impression:   I have personally reviewed her echocardiogram and cath films. She has stage D severe, symptomatic aortic stenosis with recent exacerbation of congestive heart failure with pulmonary edema and worsening shortness of breath. She has baseline chronic shortness of breath with mild exertion due to COPD. I think aortic valve replacement is indicated to prevent progressive symptoms and recurrent exacerbations of congestive heart failure. I have discussed the natural progression of aortic stenosis with her and her family and the poor prognosis over the next couple years without valve replacement. I think she would be a poor candidate for open surgical AVR due to the combination of her age, COPD on home oxygen, rheumatoid arthritis with limited mobility, chronic kidney disease, history of stroke and hypercoagulable syndrome on chronic coumadin. I think TAVR would be a reasonable alternative. I discussed the TAVR surgery with them including benefits and risks and she would like to continue the workup for that.    Plan:  She will be scheduled for gated cardiac CT, CTA of the chest, abdomen, and pelvis, PFT's and PT consult. Her CT scans will have to be staged due to her kidney function with a creat of 1.7 and a GFR of 28. After these have been  completed she will  be scheduled to see Dr. Roxy Manns for a second surgical opinion.    Gaye Pollack, MD 09/04/2015

## 2015-09-06 LAB — BASIC METABOLIC PANEL
BUN: 32 mg/dL — AB (ref 7–25)
CHLORIDE: 103 mmol/L (ref 98–110)
CO2: 26 mmol/L (ref 20–31)
CREATININE: 1.9 mg/dL — AB (ref 0.60–0.93)
Calcium: 9.5 mg/dL (ref 8.6–10.4)
GLUCOSE: 107 mg/dL — AB (ref 65–99)
POTASSIUM: 4.1 mmol/L (ref 3.5–5.3)
Sodium: 140 mmol/L (ref 135–146)

## 2015-09-07 ENCOUNTER — Encounter: Payer: Self-pay | Admitting: Internal Medicine

## 2015-09-07 ENCOUNTER — Ambulatory Visit (INDEPENDENT_AMBULATORY_CARE_PROVIDER_SITE_OTHER): Payer: Medicare HMO | Admitting: Internal Medicine

## 2015-09-07 VITALS — BP 124/80 | HR 80 | Ht 62.5 in | Wt 154.0 lb

## 2015-09-07 DIAGNOSIS — J449 Chronic obstructive pulmonary disease, unspecified: Secondary | ICD-10-CM

## 2015-09-07 NOTE — Progress Notes (Signed)
Subjective:     Patient ID: TORRA BAUNE, female   DOB: 1945-11-14, 69 y.o.   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 Annamaria Boots 2005-2006 and Dr. Chase Caller Jan 2012 - date). - MM genotype - Baseline PFt Aug 2005 - fev1 1.4L/61%, DLCO 74%.  - Class 2 dyspnea with baseline desaturation to 86% walking 185 feet in feb 2012; uses innogen system - PFTS today 03/03/2011 - FEv1 1.2L/57%, no bd respoinse. dLCO 14.5/60%. This is a 200cc drop in 7 yeears .  - April 2015:  - Pt was 79% on 2L pulsed upon entering exam room. On 3l/m pulse 90% . We discussed turning her oxygen up to 3 L pulse seen with walking. In order to keep oxygen saturations above 90%    07/10/2015 Follow up : COPD flare  Pt returns for persistent symptoms . She was seen 4 days ago for COPD flare with URI .  tx w/ prednisone 20mg  x 5 days. CXR showed CHF with pulmonary edema.  She feels better but still has DOE, sore throat, chest tightness.  No leg swelling , no orthopnea. She denies chest pain, syncope or palpitations.  Leaving to go out of town on vacation in few days.    OV 09/07/2015  Chief Complaint  Patient presents with  . Follow-up    Pt states her breathing has unchanged since last OV. Pt states she has an upcoming surgery for aortic stenosis but no scheduled surgery date yet. Pt states she has intermittent chest tightness. Pt denies cough.   moderate COPD follow-up. She has not seen myself in many months. But in October 2016 she summoned nurse practitioner. Initially this looked like a COPD flareup but in the end it done her to be severe aortic stenosis with pleural effusions. Subsequently chart review shows that she saw cardiology and cardiac thoracic surgery and at this  point is being considered for TAVR procedure   Current outpatient prescriptions:  .  acetaminophen (TYLENOL ARTHRITIS PAIN) 650 MG CR tablet, Take 650-1,300 mg by mouth 2 (two) times daily., Disp: , Rfl:  .  amLODipine (NORVASC) 10 MG tablet, Take 1 tablet (10 mg total) by mouth every evening., Disp: 90 tablet, Rfl: 4 .  aspirin 81 MG tablet, Take 81 mg by mouth at bedtime. , Disp: , Rfl:  .  atorvastatin (LIPITOR) 20 MG tablet, TAKE 1 TABLET DAILY., Disp: 90 tablet, Rfl: 1 .  febuxostat (ULORIC) 40 MG tablet, Take 40 mg by mouth every morning. , Disp: , Rfl:  .  furosemide (LASIX) 20 MG tablet, Take 1 tablet (20 mg total) by mouth daily., Disp: 30 tablet, Rfl: 3 .  levothyroxine (SYNTHROID, LEVOTHROID) 100 MCG tablet, TAKE 1 TABLET DAILY, Disp: 90 tablet, Rfl: 3 .  losartan (COZAAR) 50 MG tablet, Take 1 tablet (50 mg total) by mouth daily., Disp: 30 tablet, Rfl: 3 .  metoprolol succinate (TOPROL-XL) 100 MG 24 hr tablet, Take 1 tablet (100 mg total) by mouth 2 (two) times daily. Take with or immediately following a meal., Disp: 180 tablet, Rfl: 3 .  Multiple Vitamin (MULTIVITAMIN) capsule, Take 1 capsule by mouth daily at 12 noon. , Disp: , Rfl:  .  Omega 3 1200 MG CAPS, Take 1,500 mg by mouth daily at 12 noon. 1200mg  softgel + 300mg  softgel., Disp: , Rfl:  .  potassium chloride (K-DUR) 10 MEQ tablet, Take 1 tablet (10 mEq total) by mouth daily., Disp: 30 tablet, Rfl: 3 .  tiotropium (SPIRIVA HANDIHALER) 18 MCG inhalation capsule, INHALE ONE CAPSULE ONCE DAILY, Disp: 30 capsule, Rfl: 6 .  traMADol (ULTRAM) 50 MG tablet, Take 100 mg by mouth 2 (two) times daily. , Disp: , Rfl:  .  warfarin (COUMADIN) 5 MG tablet, TAKE 1 TABLET DAILY OR AS DIRECTED BY THE COUMADIN CLINIC, Disp: 105 tablet, Rfl: 3  Allergies  Allergen Reactions  . Allopurinol Itching, Nausea Only, Rash and Other (See Comments)    Top of head to tip of toes with rash; aches all over  . Cephalexin Other (See Comments)    Severe  case of thrush in mouth/tongue.  Tongue raw and hard and swollen.  . Nitrofurantoin Hives, Itching, Nausea And Vomiting, Rash and Other (See Comments)    Body aches, like I have the flu  . Ace Inhibitors Other (See Comments)    cough  . Sulfonamide Derivatives Hives, Itching and Rash    Immunization History  Administered Date(s) Administered  . Influenza Split 05/28/2012, 07/23/2013, 06/22/2014  . Influenza Whole 06/24/2010, 06/23/2011  . Influenza,inj,Quad PF,36+ Mos 07/06/2015  . Pneumococcal Conjugate-13 06/01/2014  . Pneumococcal Polysaccharide-23 02/10/2012     Review of Systems  Per hpi    Objective:   Physical Exam  Constitutional: She is oriented to person, place, and time. She appears well-developed and well-nourished. No distress.  Deconditioned looking somewhat but baseline  HENT:  Head: Normocephalic and atraumatic.  Right Ear: External ear normal.  Left Ear: External ear normal.  Mouth/Throat: Oropharynx is clear and moist. No oropharyngeal exudate.  Eyes: Conjunctivae and EOM are normal. Pupils are equal, round, and reactive to light. Right eye exhibits no discharge. Left eye exhibits no discharge. No scleral icterus.  Neck: Normal range of motion. Neck supple. No JVD present. No tracheal deviation present. No thyromegaly present.  Cardiovascular: Normal rate, regular rhythm and intact distal pulses.  Exam reveals no gallop and no friction rub.   Murmur heard. Pulmonary/Chest: Effort normal and breath sounds normal. No respiratory distress. She has no wheezes. She has no rales. She exhibits no tenderness.  Abdominal: Soft. Bowel sounds are normal. She exhibits no distension and no mass. There is no tenderness. There is no rebound and no guarding.  Musculoskeletal: Normal range of motion. She exhibits no edema or tenderness.  Uses walker  Lymphadenopathy:    She has no cervical adenopathy.  Neurological: She is alert and oriented to person, place, and time. She  has normal reflexes. No cranial nerve deficit. She exhibits normal muscle tone. Coordination normal.  Skin: Skin is warm and dry. No rash noted. She is not diaphoretic. No erythema. No pallor.  Psychiatric: She has a normal mood and affect. Her behavior is normal. Judgment and thought content normal.  Vitals reviewed.   Filed Vitals:   09/07/15 1436  BP: 124/80  Pulse: 80  Height: 5' 2.5" (1.588 m)  Weight: 154 lb (69.854 kg)  SpO2: 95%         Assessment:       ICD-9-CM ICD-10-CM   1. Moderate COPD (chronic obstructive pulmonary disease) ([place]) 496 J44.9        Plan:      COPD stable Glad you're up-to-date with vaccines Continue Spiriva  as before Albuterol as needed Aortic  valve surgery evaluation per cardiology and thoracic surgery  followup 3-4 months to see me or my NP   Dr. Brand Males, M.D., [facility].C.P Pulmonary and Critical Care Medicine Staff Physician [place] [facility] Pager: [phone], If no answer or between  15:00h - 7:00h: call [phone]  09/07/2015 3:22 PM

## 2015-09-07 NOTE — Patient Instructions (Signed)
ICD-9-CM ICD-10-CM   1. Moderate COPD (chronic obstructive pulmonary disease) ([place]) 496 J44.9    COPD stable Glad you're up-to-date with vaccines Continue Spiriva as before Albuterol as needed Aortic  valve surgery evaluation per cardiology and thoracic surgery  followup 3-4 months to see me or my NP

## 2015-09-27 LAB — BASIC METABOLIC PANEL WITH GFR
BUN: 32 mg/dL — AB (ref 7–25)
CALCIUM: 9.5 mg/dL (ref 8.6–10.4)
CO2: 27 mmol/L (ref 20–31)
Chloride: 104 mmol/L (ref 98–110)
Creat: 1.87 mg/dL — ABNORMAL HIGH (ref 0.60–0.93)
GFR, EST NON AFRICAN AMERICAN: 27 mL/min — AB (ref >=60)
GFR, Est African American: 31 mL/min — ABNORMAL LOW (ref >=60)
GLUCOSE: 115 mg/dL — AB (ref 65–99)
POTASSIUM: 4.5 mmol/L (ref 3.5–5.3)
Sodium: 142 mmol/L (ref 135–146)

## 2015-09-28 ENCOUNTER — Encounter (HOSPITAL_COMMUNITY): Payer: Medicare HMO

## 2015-09-28 ENCOUNTER — Ambulatory Visit (HOSPITAL_COMMUNITY)
Admission: RE | Admit: 2015-09-28 | Discharge: 2015-09-28 | Disposition: A | Discharge status: Home / Self Care | Payer: Medicare HMO | Source: Ambulatory Visit | Attending: Surgery | Admitting: Surgery

## 2015-09-28 ENCOUNTER — Encounter (HOSPITAL_COMMUNITY): Payer: Self-pay

## 2015-09-28 ENCOUNTER — Other Ambulatory Visit: Payer: Self-pay | Admitting: *Deleted

## 2015-09-28 VITALS — BP 133/77 | HR 80 | Resp 10

## 2015-09-28 DIAGNOSIS — R590 Localized enlarged lymph nodes: Secondary | ICD-10-CM | POA: Diagnosis not present

## 2015-09-28 DIAGNOSIS — I350 Nonrheumatic aortic (valve) stenosis: Secondary | ICD-10-CM

## 2015-09-28 DIAGNOSIS — I700 Atherosclerosis of aorta: Secondary | ICD-10-CM | POA: Insufficient documentation

## 2015-09-28 DIAGNOSIS — N289 Disorder of kidney and ureter, unspecified: Secondary | ICD-10-CM

## 2015-09-28 DIAGNOSIS — I358 Other nonrheumatic aortic valve disorders: Secondary | ICD-10-CM | POA: Insufficient documentation

## 2015-09-28 DIAGNOSIS — R911 Solitary pulmonary nodule: Secondary | ICD-10-CM | POA: Insufficient documentation

## 2015-09-28 IMAGING — CT CT HEART MORP W/ CTA COR W/ SCORE W/ CA W/CM &/OR W/O CM
1 of 14 series · 1 of 19 positions shown, 2 images · IV contrast (Iodine)
Comparison: No priors.

CLINICAL DATA: Aortic stenosis

EXAM:
Cardiac TAVR CT
TECHNIQUE: The patient was scanned on a Philips 256 scanner. A 120 kV
retrospective scan was triggered in the descending thoracic aorta at
111 HU's. Gantry rotation speed was 270 msecs and collimation was .9
mm. No beta blockade or nitro were given. The 3D data set was
reconstructed in 5% intervals of the R-R cycle. Systolic and
diastolic phases were analyzed on a dedicated work station using
MPR, MIP and VRT modes. The patient received 80 cc of contrast.

[Series 200: locator · axial · 0.35mm/px · z∈[+1045,+1045]mm · 1 of 1 slices shown, 2 images]
[im 1/1  vessel]
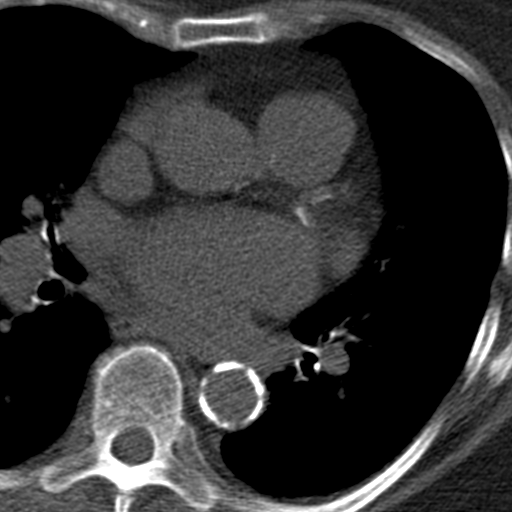
[im 1/1  lung]
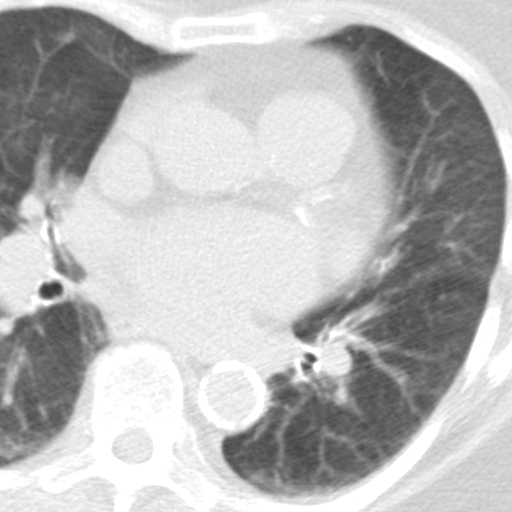

[1 of 19 positions shown; findings below may reference images not displayed]

FINDINGS: Aortic Valve: Trileaflet and calcified. Small area of annular
calcification at the base of the right leaflet. Calcification of the
intervalvular fibrosa below the annulus at the base of the anterior
mitral leaflet

Severe LAE with dilated ALBITER.  No thrombus

Aorta: Normal sized root with normal origin of great vessels.
Calcification of the STJ at the RCA ostium. Moderate calcification
of the arch and descending thoracic aorta

Sinotubular Junction:  25 mm

Ascending Thoracic Aorta:  32 mm

Aortic Arch:  26 mm

Descending Thoracic Aorta:  24 mm

Sinus of Valsalva Measurements:

Non-coronary:  28.5 mm

Right -coronary:  27 mm

Left -coronary:  28 mm

Coronary Artery Height above Annulus:

Left Main:  16.3 mm above annulus

Right Coronary:  14 mm above annulus

Virtual Basal Annulus Measurements:

Maximum/Minimum Diameter:  24.9 mm x 21.3 mm

Perimeter:  76 mm

Area:  442 mm2

Coronary Arteries:  Suitable height above annulus for delivery

Optimum Fluoroscopic Angle for Delivery: LAO 27 degrees Cranial 1
degree
IMPRESSION: 1) Calcified trileaflet aortic valve with annulus 442 mm2 suitable
for a 26 mm Sapien 3 valve

2) Suitable coronary height for deployment

3) Optimum angle for deployment LAO 27 degrees Cranial 1 degree

4) No ALBITER thrombus severely dilated ALBITER/ALBITER

5) Calcification of the descending thoracic aorta and arch no
aneurysm normal sized aortic ALBITER

ALBITER

EXAM:
OVER-READ INTERPRETATION  CT CHEST

The following report is an over-read performed by radiologist Dr.
over-read does not include interpretation of cardiac or coronary
anatomy or pathology. The coronary calcium score/coronary CTA
interpretation by the cardiologist is attached.
FINDINGS: 7 x 5 mm (mean diameter 6 mm) left upper lobe nodule (image 80 of
series 412). Mild diffuse bronchial wall thickening with mild
centrilobular and paraseptal emphysema. Within the visualized
portions of the thorax there is no acute consolidative airspace
disease, no pleural effusion and no pneumothorax. Multiple enlarged
and borderline enlarged mediastinal and right hilar lymph nodes are
noted, measuring up to 11 mm in the right hilar region, 11 mm in the
prevascular nodal station, 12 mm in the subcarinal nodal station and
11 mm in the low right paratracheal nodal station. Sub cm
low-attenuation lesions in segment 4A of the liver are too small to
definitively characterize, but favored to represent small cysts.
Small hiatal hernia. There are no aggressive appearing lytic or
blastic lesions noted in the visualized portions of the skeleton.
IMPRESSION: 1. Mediastinal and right hilar lymphadenopathy. This is of uncertain
etiology and significance, but clinical correlation for signs and
symptoms of lymphoproliferative disorder is suggested.
2. In addition, there is a nodule in the left upper lobe (image 80
of series 412) which has a mean diameter of 6 mm. Given the smoking
related changes in the lungs, the patient is at high risk for
bronchogenic carcinoma, and accordingly a follow-up chest CT at 6-12
months is recommended. This recommendation follows the consensus
statement: Guidelines for Management of Small Pulmonary Nodules
Detected on CT Scans: A Statement from the [HOSPITAL] as
3. Additional incidental findings, as above.

## 2015-09-28 MED ORDER — IOHEXOL 350 MG/ML SOLN
80.0000 mL | Freq: Once | INTRAVENOUS | Status: AC | PRN
  Administered 2015-09-28: 80 mL via INTRAVENOUS

## 2015-09-28 MED ORDER — SODIUM BICARBONATE 8.4 % IV SOLN
INTRAVENOUS | Status: DC
  Filled 2015-09-28: qty 500

## 2015-09-28 MED ORDER — SODIUM BICARBONATE BOLUS VIA INFUSION
INTRAVENOUS | Status: DC
  Filled 2015-09-28: qty 1

## 2015-10-02 ENCOUNTER — Other Ambulatory Visit: Payer: Self-pay

## 2015-10-02 DIAGNOSIS — N289 Disorder of kidney and ureter, unspecified: Secondary | ICD-10-CM

## 2015-10-02 DIAGNOSIS — I350 Nonrheumatic aortic (valve) stenosis: Secondary | ICD-10-CM

## 2015-10-03 ENCOUNTER — Other Ambulatory Visit: Payer: Self-pay | Admitting: *Deleted

## 2015-10-03 ENCOUNTER — Other Ambulatory Visit (HOSPITAL_BASED_OUTPATIENT_CLINIC_OR_DEPARTMENT_OTHER): Payer: Medicare HMO

## 2015-10-03 DIAGNOSIS — D689 Coagulation defect, unspecified: Secondary | ICD-10-CM

## 2015-10-03 DIAGNOSIS — N289 Disorder of kidney and ureter, unspecified: Secondary | ICD-10-CM

## 2015-10-03 LAB — COMPLETE METABOLIC PANEL WITH GFR
ALBUMIN: 2.9 g/dL — AB (ref 3.6–5.1)
ALK PHOS: 86 U/L (ref 33–130)
ALT: 11 U/L (ref 6–29)
AST: 13 U/L (ref 10–35)
BILIRUBIN TOTAL: 0.4 mg/dL (ref 0.2–1.2)
BUN: 32 mg/dL — AB (ref 7–25)
CALCIUM: 9 mg/dL (ref 8.6–10.4)
CHLORIDE: 104 mmol/L (ref 98–110)
CO2: 25 mmol/L (ref 20–31)
CREATININE: 2.24 mg/dL — AB (ref 0.60–0.93)
GFR, EST AFRICAN AMERICAN: 25 mL/min — AB (ref >=60)
GFR, Est Non African American: 21 mL/min — ABNORMAL LOW (ref >=60)
Glucose, Bld: 84 mg/dL (ref 65–99)
Potassium: 4.5 mmol/L (ref 3.5–5.3)
Sodium: 138 mmol/L (ref 135–146)
TOTAL PROTEIN: 6 g/dL — AB (ref 6.1–8.1)

## 2015-10-03 LAB — PROTIME-INR
INR: 4.6 — ABNORMAL HIGH (ref 2.00–3.50)
PROTIME: 55.2 s — AB (ref 10.6–13.4)

## 2015-10-04 ENCOUNTER — Telehealth: Payer: Self-pay | Admitting: *Deleted

## 2015-10-04 ENCOUNTER — Other Ambulatory Visit: Payer: Self-pay | Admitting: *Deleted

## 2015-10-04 DIAGNOSIS — N289 Disorder of kidney and ureter, unspecified: Secondary | ICD-10-CM

## 2015-10-04 NOTE — Telephone Encounter (Signed)
On 1/11 INR 4.6- pt informed to hold coumadin  Until MD reviewed and advised.  Today per MD pt is to resume at 2.5 mg x 2 days then alternate 2.5 mg with 5 mg.  Recheck in one week.  Above discussed with Pleas Koch who verbalized understanding.  Appointment made per this call for lab on 1/19 at 2 pm

## 2015-10-05 ENCOUNTER — Ambulatory Visit (HOSPITAL_COMMUNITY): Payer: Medicare HMO

## 2015-10-05 ENCOUNTER — Encounter (HOSPITAL_COMMUNITY): Payer: Medicare HMO

## 2015-10-09 ENCOUNTER — Ambulatory Visit: Payer: Medicare HMO | Attending: Surgery | Admitting: Physical Therapy

## 2015-10-09 ENCOUNTER — Encounter: Payer: Self-pay | Admitting: Physical Therapy

## 2015-10-09 ENCOUNTER — Encounter: Payer: Medicare HMO | Admitting: Thoracic Surgery (Cardiothoracic Vascular Surgery)

## 2015-10-09 DIAGNOSIS — R293 Abnormal posture: Secondary | ICD-10-CM | POA: Insufficient documentation

## 2015-10-09 DIAGNOSIS — I350 Nonrheumatic aortic (valve) stenosis: Secondary | ICD-10-CM

## 2015-10-09 DIAGNOSIS — R2681 Unsteadiness on feet: Secondary | ICD-10-CM | POA: Diagnosis present

## 2015-10-09 DIAGNOSIS — R262 Difficulty in walking, not elsewhere classified: Secondary | ICD-10-CM | POA: Insufficient documentation

## 2015-10-09 DIAGNOSIS — M6281 Muscle weakness (generalized): Secondary | ICD-10-CM

## 2015-10-09 NOTE — Therapy (Signed)
[place], [place], [address] Phone: [phone]   Fax:  [phone]  Physical Therapy Evaluation  Patient Details  Name: Karen Chambers MRN: [id number] Date of Birth: Dec 04, 1945 Referring Provider: Dr. Gilford Raid  Encounter Date: 10/09/2015      PT End of Session - 10/09/15 1452    Visit Number 1   PT Start Time [id number]   PT Stop Time 1425   PT Time Calculation (min) 50 min   Equipment Utilized During Treatment Gait belt  WC follow   Activity Tolerance Patient limited by pain      Past Medical History  Diagnosis Date  . Other and unspecified hyperlipidemia 401.9  . Hypothyroidism   . Obesity, unspecified   . Gout   . Low back pain     buldging disc ,and herniated disc  . Rheumatoid arthritis(714.0)   . Cough 11/12/2010  . Hypertension   . Coagulopathy ([place])   . COPD (chronic obstructive pulmonary disease) ([place])   . Complication of anesthesia     slow awaken from anesthesia  . Von Willebrand disease ([place])     Dr. Jana Hakim follows  . Aortic stenosis   . CAD (coronary artery disease)   . TIA (transient ischemic attack)     Past Surgical History  Procedure Laterality Date  . Cholecystectomy    . Total abdominal hysterectomy    . Stapedectomy Bilateral     right ear hearing aid  . Esophagogastroduodenoscopy (egd) with propofol N/A 06/23/2014    Procedure: ESOPHAGOGASTRODUODENOSCOPY (EGD) WITH PROPOFOL;  Surgeon: Jerene Bears, MD;  Location: [facility];  Service: Gastroenterology;  Laterality: N/A;  . Colonoscopy with propofol N/A 06/23/2014    Procedure: COLONOSCOPY WITH PROPOFOL;  Surgeon: Jerene Bears, MD;  Location: [facility];  Service: Gastroenterology;  Laterality: N/A;  . Cardiac catheterization N/A 08/09/2015    Procedure: Right/Left Heart Cath and Coronary Angiography;  Surgeon: Larey Dresser, MD;  Location: [facility];  Service: Cardiovascular;  Laterality: N/A;     There were no vitals filed for this visit.  Visit Diagnosis:  Generalized muscle weakness - Plan: PT plan of care cert/re-cert  Difficulty walking - Plan: PT plan of care cert/re-cert  Unsteadiness - Plan: PT plan of care cert/re-cert  Abnormal posture - Plan: PT plan of care cert/re-cert  Severe aortic stenosis - Plan: PT plan of care cert/re-cert      Subjective Assessment - 10/09/15 1339    Subjective In Oct 2016, pt felt she might be getting pneomnia or bronchitis, went to pulmonologist for evaluation, treated her, at f/u in December they referred her for an echo and discovered the severe aortic stenosis. Pt reports she cannot tell if it is her  heart, COPD, or Ra really limiting. Pt reports knees have been hurting worse this week.    Patient Stated Goals fix heart problem   Currently in Pain? Yes  0/10 at rest   Pain Score 10-Worst pain ever   Pain Location Knee   Pain Orientation Right;Left;Anterior;Medial   Pain Descriptors / Indicators Throbbing;Squeezing   Pain Type Chronic pain   Pain Onset More than a month ago   Pain Frequency Intermittent   Aggravating Factors  movement   Pain Relieving Factors rest, braces a little            [facility] PT Assessment - 10/09/15 0001    Assessment   Medical Diagnosis severe aortic stenosis   Referring  Provider Dr. Gilford Raid   Onset Date/Surgical Date 08/09/15   Precautions   Precautions None   Restrictions   Weight Bearing Restrictions No   Balance Screen   Has the patient fallen in the past 6 months No   Has the patient had a decrease in activity level because of a fear of falling?  No   Is the patient reluctant to leave their home because of a fear of falling?  No   Home Ecologist residence   Living Arrangements Spouse/significant other   Home Access Ramped entrance   [place] One level   [place] - 4 wheels  O2 at night - 3L   Prior Function   Level of  Independence Needs assistance with homemaking   Posture/Postural Control   Posture/Postural Control Postural limitations   Postural Limitations Forward head;Rounded Shoulders   ROM / Strength   AROM / PROM / Strength AROM;Strength   AROM   Overall AROM  Within functional limits for tasks performed   Strength   Overall Strength Comments UE and LE grossly 4/5 except hip and shoulders 3+/5   Strength Assessment Site Hand   Right/Left hand Right;Left   Right Hand Grip (lbs) 24  R hand dominant   Left Hand Grip (lbs) 7  reports been giving her a fit with the RA   Ambulation/Gait   Assistive device 4-wheeled walker   Gait Comments Heavy UE support on walker and forward flexed posture, poor foot clearance   6 Minute walk- Post Test   6 Minute Walk Post Test yes   BP (mmHg) 128/66 mmHg   HR (bpm) 90   02 Sat (%RA) 97 %   Modified Borg Scale for Dyspnea 2- Mild shortness of breath   Perceived Rate of Exertion (Borg) 19-Very, very hard  due to knee pain   6 minute walk test results    Aerobic Endurance Distance Walked 175   Endurance additional comments Pt required extended seated rest after amblating 1:20 (145') with 4 wheeled RW due primarily to bil, knee pain although she did also report mild SOB. Pt able to resume after 2:40 rest and ambulate an additional 30' before stopping for good.          [facility] Pre-Surgical Assessment - 10/09/15 0001    5 Meter Walk Test- trial 1 9 sec   5 Meter Walk Test- trial 2 9 sec.    5 Meter Walk Test- trial 3 9 sec.  < 6 sec = slow speed   5 meter walk test average 9 sec   Timed Up & Go Test trial  22 sec.   Comments with 4 wheeled RW with reported 8-9/10 bil knee pain.   comment Unable without UE support   Comment Unable without UE support, uncontrolled descent with stand to sit   ADL/IADL Independent with: Bathing;Dressing;Finances   ADL/IADL Needs Assistance with: Meal prep;Yard work   ADL/IADL Therapist, sports Index Midly frail   6 Minute Walk-  Baseline yes   BP (mmHg) 102/66 mmHg   HR (bpm) 79   02 Sat (%RA) 93 %   Modified Borg Scale for Dyspnea 0- Nothing at all   Perceived Rate of Exertion (Borg) 6-                                     Plan - 10/09/15 1452  Clinical Impression Statement Pt is a 70 yo female presenting to OP PT for eval prior to possible TAVR surgery due to severe aortic stenosis. Pt presenting with significant bil. knee pain today likely due to the weather per pt report. Pt reports difficulty in deciphering when her heart started limiting her. She notes she felt like she was come down with pneumonia or bronchitis in Oct 2016 so she went to the MD. She was treated and at her follow up in [place], she was referred to cardiologist. at which point AS was discovered. Pt reports primarily using walker in the community with her knee pain dictating between that and her transport chair. Pt presented in transport chair today due to severe knee pain. Pt presents with moderate weakness, fair to poor balance, and limited overall mobility. Pt able to ambulate 175' total in 6 minute walk test and relies heavily on UE support on the walker when ambulating.    PT Frequency One time visit   Consulted and Agree with Plan of Care Patient;Family member/caregiver          G-Codes - 2015/10/17 1500    Functional Assessment Tool Used 175' with rollator during 6 minute walk   Functional Limitation Mobility: Walking and moving around   Mobility: Walking and Moving Around Current Status [phone]) At least 80 percent but less than 100 percent impaired, limited or restricted   Mobility: Walking and Moving Around Goal Status [phone]) At least 80 percent but less than 100 percent impaired, limited or restricted   Mobility: Walking and Moving Around Discharge Status [phone]) At least 80 percent but less than 100 percent impaired, limited or restricted       Problem List Patient Active Problem List   Diagnosis  Date Noted  . Aortic stenosis 08/23/2015  . COPD exacerbation ([place]) 07/06/2015  . Iron deficiency anemia 02/12/2015  . Benign neoplasm of colon 06/23/2014  . IDA (iron deficiency anemia) 06/23/2014  . Family history of colon cancer 06/23/2014  . Erosive gastritis 06/23/2014  . Need for prophylactic vaccination against Streptococcus pneumoniae (pneumococcus) 06/08/2014  . Moderate COPD (chronic obstructive pulmonary disease) ([place]) 06/01/2014  . Coagulopathy ([place]) 02/07/2014  . Antiphospholipid antibody syndrome ([place]) 02/03/2012  . Dyspnea 08/20/2011  . Hyperlipidemia 08/20/2011  . C O P D 11/12/2010  . Cough 11/12/2010  . PNEUMONIA, ORGANISM UNSPECIFIED 10/17/2010  . CHRONIC OBSTRUCTIVE PULMONARY DISEASE, ACUTE EXACERBATION 10/17/2010  . HYPOXEMIA 10/17/2010  . ESSENTIAL HYPERTENSION, BENIGN 09/02/2010  . ESSENTIAL HYPERTENSION 04/29/2010  . CARDIAC MURMUR 04/29/2010    Romualdo Bolk, PT October 17, 2015, 3:03 PM  [facility] [address] [place], [place], [address] Phone: [phone]   Fax:  [phone]  Name: Karen Chambers MRN: [id number] Date of Birth: 08-May-1946

## 2015-10-10 LAB — COMPREHENSIVE METABOLIC PANEL
ALBUMIN: 2.7 g/dL — AB (ref 3.6–5.1)
ALK PHOS: 81 U/L (ref 33–130)
ALT: 12 U/L (ref 6–29)
AST: 14 U/L (ref 10–35)
BUN: 34 mg/dL — AB (ref 7–25)
CALCIUM: 9.1 mg/dL (ref 8.6–10.4)
CHLORIDE: 105 mmol/L (ref 98–110)
CO2: 25 mmol/L (ref 20–31)
Creat: 2.3 mg/dL — ABNORMAL HIGH (ref 0.60–0.93)
Glucose, Bld: 118 mg/dL — ABNORMAL HIGH (ref 65–99)
POTASSIUM: 4.4 mmol/L (ref 3.5–5.3)
Sodium: 140 mmol/L (ref 135–146)
TOTAL PROTEIN: 5.9 g/dL — AB (ref 6.1–8.1)
Total Bilirubin: 0.4 mg/dL (ref 0.2–1.2)

## 2015-10-11 ENCOUNTER — Telehealth: Payer: Self-pay | Admitting: Oncology

## 2015-10-11 ENCOUNTER — Other Ambulatory Visit: Payer: Medicare HMO

## 2015-10-11 NOTE — Telephone Encounter (Signed)
Spoke to patient husband about lab appointment r/s from 1/19 to 1/23.

## 2015-10-12 ENCOUNTER — Ambulatory Visit (HOSPITAL_COMMUNITY): Admission: RE | Admit: 2015-10-12 | Payer: Medicare HMO | Source: Ambulatory Visit

## 2015-10-12 ENCOUNTER — Ambulatory Visit (HOSPITAL_COMMUNITY)
Admission: RE | Admit: 2015-10-12 | Discharge: 2015-10-12 | Disposition: A | Discharge status: Home / Self Care | Payer: Medicare HMO | Source: Ambulatory Visit | Attending: Surgery | Admitting: Surgery

## 2015-10-12 ENCOUNTER — Encounter (HOSPITAL_COMMUNITY): Payer: Self-pay

## 2015-10-12 VITALS — BP 134/74 | HR 83

## 2015-10-12 DIAGNOSIS — I2510 Atherosclerotic heart disease of native coronary artery without angina pectoris: Secondary | ICD-10-CM | POA: Diagnosis not present

## 2015-10-12 DIAGNOSIS — M899 Disorder of bone, unspecified: Secondary | ICD-10-CM | POA: Diagnosis not present

## 2015-10-12 DIAGNOSIS — I517 Cardiomegaly: Secondary | ICD-10-CM | POA: Diagnosis not present

## 2015-10-12 DIAGNOSIS — N289 Disorder of kidney and ureter, unspecified: Secondary | ICD-10-CM | POA: Insufficient documentation

## 2015-10-12 DIAGNOSIS — I350 Nonrheumatic aortic (valve) stenosis: Secondary | ICD-10-CM | POA: Insufficient documentation

## 2015-10-12 DIAGNOSIS — J8410 Pulmonary fibrosis, unspecified: Secondary | ICD-10-CM | POA: Diagnosis not present

## 2015-10-12 DIAGNOSIS — R590 Localized enlarged lymph nodes: Secondary | ICD-10-CM | POA: Insufficient documentation

## 2015-10-12 DIAGNOSIS — J432 Centrilobular emphysema: Secondary | ICD-10-CM | POA: Insufficient documentation

## 2015-10-12 DIAGNOSIS — Z01818 Encounter for other preprocedural examination: Secondary | ICD-10-CM | POA: Diagnosis not present

## 2015-10-12 DIAGNOSIS — N261 Atrophy of kidney (terminal): Secondary | ICD-10-CM | POA: Insufficient documentation

## 2015-10-12 DIAGNOSIS — K769 Liver disease, unspecified: Secondary | ICD-10-CM | POA: Diagnosis not present

## 2015-10-12 DIAGNOSIS — R918 Other nonspecific abnormal finding of lung field: Secondary | ICD-10-CM | POA: Diagnosis not present

## 2015-10-12 LAB — PULMONARY FUNCTION TEST
DL/VA % pred: 74 %
DL/VA: 3.27 ml/min/mmHg/L
DLCO unc % pred: 57 %
DLCO unc: 11.58 ml/min/mmHg
FEF 25-75 Post: 0.61 L/sec
FEF 25-75 Pre: 0.45 L/sec
FEF2575-%Change-Post: 35 %
FEF2575-%PRED-PRE: 26 %
FEF2575-%Pred-Post: 36 %
FEV1-%Change-Post: 7 %
FEV1-%PRED-POST: 56 %
FEV1-%PRED-PRE: 52 %
FEV1-POST: 1.1 L
FEV1-PRE: 1.03 L
FEV1FVC-%Change-Post: -5 %
FEV1FVC-%Pred-Pre: 78 %
FEV6-%CHANGE-POST: 11 %
FEV6-%PRED-PRE: 67 %
FEV6-%Pred-Post: 75 %
FEV6-POST: 1.86 L
FEV6-PRE: 1.67 L
FEV6FVC-%Change-Post: -1 %
FEV6FVC-%PRED-POST: 100 %
FEV6FVC-%PRED-PRE: 101 %
FVC-%CHANGE-POST: 13 %
FVC-%PRED-PRE: 66 %
FVC-%Pred-Post: 75 %
FVC-POST: 1.96 L
FVC-PRE: 1.72 L
POST FEV6/FVC RATIO: 96 %
PRE FEV1/FVC RATIO: 60 %
Post FEV1/FVC ratio: 56 %
Pre FEV6/FVC Ratio: 97 %
RV % PRED: 94 %
RV: 1.96 L
TLC % PRED: 89 %
TLC: 4.11 L

## 2015-10-12 MED ORDER — SODIUM BICARBONATE BOLUS VIA INFUSION
INTRAVENOUS | Status: AC
  Administered 2015-10-12: 15:00:00 via INTRAVENOUS
  Filled 2015-10-12: qty 1

## 2015-10-12 MED ORDER — ALBUTEROL SULFATE (2.5 MG/3ML) 0.083% IN NEBU
2.5000 mg | INHALATION_SOLUTION | Freq: Once | RESPIRATORY_TRACT | Status: AC
  Administered 2015-10-12: 2.5 mg via RESPIRATORY_TRACT

## 2015-10-12 MED ORDER — SODIUM BICARBONATE 8.4 % IV SOLN
INTRAVENOUS | Status: AC
  Administered 2015-10-12: 16:00:00 via INTRAVENOUS
  Filled 2015-10-12: qty 500

## 2015-10-12 MED ORDER — IOHEXOL 350 MG/ML SOLN
80.0000 mL | Freq: Once | INTRAVENOUS | Status: AC | PRN
  Administered 2015-10-12: 80 mL via INTRAVENOUS

## 2015-10-12 NOTE — Progress Notes (Addendum)
Arrived to short stay via wc for IV bicarb, iv site c/d/i. bp 144/79 p 89 spo2 99, Dc@ 1810 via wc without complaint //vss.

## 2015-10-12 NOTE — Progress Notes (Signed)
CT scan completed. Pt transferred to short stay to complete bicarb infusion.

## 2015-10-15 ENCOUNTER — Other Ambulatory Visit (HOSPITAL_BASED_OUTPATIENT_CLINIC_OR_DEPARTMENT_OTHER): Payer: Medicare HMO

## 2015-10-15 DIAGNOSIS — D689 Coagulation defect, unspecified: Secondary | ICD-10-CM | POA: Diagnosis not present

## 2015-10-15 LAB — PROTIME-INR
INR: 2.1 (ref 2.00–3.50)
Protime: 25.2 Seconds — ABNORMAL HIGH (ref 10.6–13.4)

## 2015-10-16 ENCOUNTER — Institutional Professional Consult (permissible substitution) (INDEPENDENT_AMBULATORY_CARE_PROVIDER_SITE_OTHER): Payer: Medicare HMO | Admitting: Thoracic Surgery (Cardiothoracic Vascular Surgery)

## 2015-10-16 ENCOUNTER — Other Ambulatory Visit: Payer: Self-pay | Admitting: Cardiology

## 2015-10-16 ENCOUNTER — Telehealth: Payer: Self-pay

## 2015-10-16 ENCOUNTER — Encounter: Payer: Self-pay | Admitting: Thoracic Surgery (Cardiothoracic Vascular Surgery)

## 2015-10-16 VITALS — BP 138/74 | HR 79 | Resp 16 | Ht 62.5 in | Wt 153.0 lb

## 2015-10-16 DIAGNOSIS — I350 Nonrheumatic aortic (valve) stenosis: Secondary | ICD-10-CM | POA: Diagnosis not present

## 2015-10-16 DIAGNOSIS — I5032 Chronic diastolic (congestive) heart failure: Secondary | ICD-10-CM

## 2015-10-16 DIAGNOSIS — N183 Chronic kidney disease, stage 3 unspecified: Secondary | ICD-10-CM

## 2015-10-16 DIAGNOSIS — N189 Chronic kidney disease, unspecified: Secondary | ICD-10-CM | POA: Insufficient documentation

## 2015-10-16 LAB — BASIC METABOLIC PANEL
BUN: 27 mg/dL — AB (ref 7–25)
CHLORIDE: 103 mmol/L (ref 98–110)
CO2: 26 mmol/L (ref 20–31)
CREATININE: 2.37 mg/dL — AB (ref 0.60–0.93)
Calcium: 9 mg/dL (ref 8.6–10.4)
GLUCOSE: 141 mg/dL — AB (ref 65–99)
Potassium: 4.3 mmol/L (ref 3.5–5.3)
Sodium: 138 mmol/L (ref 135–146)

## 2015-10-16 NOTE — Patient Instructions (Signed)
Patient has been instructed to stop taking Coumadin (warfarin) 5 days before your surgery.  Begin Lovenox injections as directed.  Patient should continue taking all other medications without change through the day before surgery.  Patient should have nothing to eat or drink after midnight the night before surgery.  On the morning of surgery patient should take only Synthroid, Toprol XL and Norvasc with a sip of water.

## 2015-10-16 NOTE — Progress Notes (Signed)
HEART AND [place] SURGERY CONSULTATION REPORT  Referring Provider is Larey Dresser, MD PCP is Aura Dials, PA-C  Chief Complaint  Patient presents with  . 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  The patient has been told that she had a heart murmur for most of her adult life.  Echocardiogram performed in 2011 revealed mildly elevated velocity through the aortic valve that was felt to be primarily secondary to basal septal hypertrophy with mean transvalvular gradient estimated 10 mmHg.  She has been followed by Dr. Chase Caller for several years with known history of COPD for which she uses oxygen at home. She quit smoking in 2000. Over the past 6 months the patient has developed progressive symptoms of exertional shortness of breath. She initially attributed this to her underlying COPD.  She was seen in follow-up in the pulmonary clinic and a BNP level was checked and notably elevated. The patient was referred for follow-up echocardiogram which was performed 07/30/2015 and revealed severe aortic stenosis with peak velocity across aortic valve measured 5.2 m/s corresponded to mean transvalvular gradient estimated 58 mmHg.  Left ventricular systolic function was preserved. The patient was seen in follow-up by Dr. Aundra Dubin and subsequently underwent left and right heart catheterization on 08/09/2015.  Diagnostic catheterization was notable for the presence of mild  nonobstructive coronary artery disease and mild pulmonary hypertension. Transvalvular gradients across the aortic valve were not assessed by catheterization. The patient was referred for surgical consultation and has been evaluated previously by Dr. Cyndia Bent.  The patient was felt to be of at least moderate risk for conventional surgical aortic valve replacement and transcatheter aortic valve replacement was discussed as an alternative. The patient has subsequently undergone CT angiography to characterize the anatomical feasibility of transcatheter aortic valve replacement. She has been referred for a second surgical opinion.  The patient is married and lives locally in [place] with her husband. 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  . In the distant past the patient had remained very active physically and enjoyed ballroom dancing. However, she has developed progressive rheumatoid arthritis that has severely limited her physical mobility. Rheumatoid arthritis of flexor knees and lower back primarily. She has had several falls and is somewhat unsteady on her feet. She admits that she now lives a very sedentary lifestyle. She enjoys reading and playing board games. She rarely drives an automobile.  She describes progressive symptoms of exertional shortness of breath over the past 6 months. She now gets short of breath with moderate physical activity. She denies shortness of breath with simple things around the house. She has never had any resting shortness of breath, PND, orthopnea, palpitations, dizzy spells, or syncope.  She does report some exertional tightness in her chest that corresponds with periods when she is more severely short of breath.  Past Medical History  Diagnosis Date  . Other and unspecified hyperlipidemia 401.9  . Hypothyroidism   . Obesity, unspecified   . Gout   .  Low back pain     buldging disc ,and  herniated disc  . Rheumatoid arthritis(714.0)   . Cough 11/12/2010  . Hypertension   . Coagulopathy ([place])   . COPD (chronic obstructive pulmonary disease) ([place])     home O2  . Complication of anesthesia     slow awaken from anesthesia  . Von Willebrand disease ([place])     Dr. Jana Hakim follows  . Aortic stenosis   . CAD (coronary artery disease)   . TIA (transient ischemic attack)   . Chronic diastolic (congestive) heart failure ([place])   . Chronic kidney disease   . Antiphospholipid antibody syndrome ([place]) 02/03/2012  . IDA (iron deficiency anemia) 06/23/2014    Past Surgical History  Procedure Laterality Date  . Cholecystectomy    . Total abdominal hysterectomy    . Stapedectomy Bilateral     right ear hearing aid  . Esophagogastroduodenoscopy (egd) with propofol N/A 06/23/2014    Procedure: ESOPHAGOGASTRODUODENOSCOPY (EGD) WITH PROPOFOL;  Surgeon: Jerene Bears, MD;  Location: [facility];  Service: Gastroenterology;  Laterality: N/A;  . Colonoscopy with propofol N/A 06/23/2014    Procedure: COLONOSCOPY WITH PROPOFOL;  Surgeon: Jerene Bears, MD;  Location: [facility];  Service: Gastroenterology;  Laterality: N/A;  . Cardiac catheterization N/A 08/09/2015    Procedure: Right/Left Heart Cath and Coronary Angiography;  Surgeon: Larey Dresser, MD;  Location: [facility];  Service: Cardiovascular;  Laterality: N/A;    Family History  Problem Relation Age of Onset  . Cancer Father   . Heart disease Mother   . Heart attack Mother   . Hypertension Sister   . Stroke Neg Hx     Social History   Social History  . Marital Status: Married    Spouse Name: N/A  . Number of Children: 2  . Years of Education: N/A   Occupational History  . Retired-Accounting Dept    Social History Main Topics  . Smoking status: Former Smoker -- 1.00 packs/day for 36 years    Types: Cigarettes    Quit date: 07/05/1999  . Smokeless tobacco: Never Used     Comment: 1ppd x 36 years  .  Alcohol Use: No  . Drug Use: No  . Sexual Activity: Not on file   Other Topics Concern  . Not on file   Social History Narrative    Current Outpatient Prescriptions  Medication Sig Dispense Refill  . acetaminophen (TYLENOL ARTHRITIS PAIN) 650 MG CR tablet Take 650-1,300 mg by mouth 2 (two) times daily.    Marland Kitchen amLODipine (NORVASC) 10 MG tablet Take 1 tablet (10 mg total) by mouth every evening. 90 tablet 4  . aspirin 81 MG tablet Take 81 mg by mouth at bedtime.     Marland Kitchen atorvastatin (LIPITOR) 20 MG tablet TAKE 1 TABLET DAILY. 90 tablet 1  . febuxostat (ULORIC) 40 MG tablet Take 40 mg by mouth every morning.     . furosemide (LASIX) 20 MG tablet Take 1 tablet (20 mg total) by mouth daily. 30 tablet 3  . levothyroxine (SYNTHROID, LEVOTHROID) 100 MCG tablet TAKE 1 TABLET DAILY 90 tablet 3  . losartan (COZAAR) 50 MG tablet Take 1 tablet (50 mg total) by mouth daily. 30 tablet 3  . metoprolol succinate (TOPROL-XL) 100 MG 24 hr tablet Take 1 tablet (100 mg total) by mouth 2 (two) times daily. Take with or immediately following a meal. 180 tablet 3  . Multiple Vitamin (MULTIVITAMIN) capsule Take 1 capsule by  mouth daily at 12 noon.     . Omega 3 1200 MG CAPS Take 1,500 mg by mouth daily at 12 noon. 1200mg  softgel + 300mg  softgel.    . potassium chloride (K-DUR) 10 MEQ tablet Take 1 tablet (10 mEq total) by mouth daily. 30 tablet 3  . tiotropium (SPIRIVA HANDIHALER) 18 MCG inhalation capsule INHALE ONE CAPSULE ONCE DAILY 30 capsule 6  . traMADol (ULTRAM) 50 MG tablet Take 100 mg by mouth 2 (two) times daily.     Marland Kitchen warfarin (COUMADIN) 5 MG tablet TAKE 1 TABLET DAILY OR AS DIRECTED BY THE COUMADIN CLINIC 105 tablet 3   No current facility-administered medications for this visit.    Allergies  Allergen Reactions  . Allopurinol Itching, Nausea Only, Rash and Other (See Comments)    Top of head to tip of toes with rash; aches all over  . Cephalexin Other (See Comments)    Severe case of thrush in  mouth/tongue.  Tongue raw and hard and swollen.  . Nitrofurantoin Hives, Itching, Nausea And Vomiting, Rash and Other (See Comments)    Body aches, like I have the flu  . Ace Inhibitors Other (See Comments)    cough  . Sulfonamide Derivatives Hives, Itching and Rash      Review of Systems:   General:  normal appetite, decreased energy, no weight gain, no weight loss, no fever  Cardiac:  + chest pain with exertion, no chest pain at rest, + SOB with exertion, no resting SOB, no PND, no orthopnea, no palpitations, no arrhythmia, no atrial fibrillation, no LE edema, no dizzy spells, no syncope  Respiratory:  + shortness of breath, + home oxygen, no productive cough, no dry cough, no bronchitis, occasional wheezing, no hemoptysis, no asthma, no pain with inspiration or cough, no sleep apnea, no CPAP at night  GI:   no difficulty swallowing, no reflux, no frequent heartburn, no hiatal hernia, no abdominal pain, no constipation, no diarrhea, no hematochezia, no hematemesis, no melena  GU:   no dysuria,  no frequency, no urinary tract infection, no hematuria, no kidney stones, + kidney disease  Vascular:  no pain suggestive of claudication, no pain in feet, no leg cramps, no varicose veins, no DVT, no non-healing foot ulcer  Neuro:   + stroke, no TIA's, no seizures, no headaches, no temporary blindness one eye,  no slurred speech, no peripheral neuropathy, + chronic pain, + instability of gait, no memory/cognitive dysfunction  Musculoskeletal: + arthritis, + joint swelling, no myalgias, + difficulty walking, decreased mobility   Skin:   no rash, no itching, no skin infections, no pressure sores or ulcerations  Psych:   no anxiety, no depression, no nervousness, no unusual recent stress  Eyes:   no blurry vision, + floaters, no recent vision changes, + wears glasses or contacts  ENT:   + hearing loss, no loose or painful teeth, + dentures, last saw dentist several years ago  Hematologic:  + easy  bruising, + abnormal bleeding, + clotting disorder, no frequent epistaxis  Endocrine:  no diabetes, does not check CBG's at home           Physical Exam:   BP 138/74 mmHg  Pulse 79  Resp 16  Ht 5' 2.5" (1.588 m)  Wt 153 lb (69.4 kg)  BMI 27.52 kg/m2  SpO2 90%  General:  Mildly obese,  well-appearing  HEENT:  Unremarkable   Neck:   no JVD, no bruits, no adenopathy   Chest:  clear to auscultation, symmetrical breath sounds, no wheezes, no rhonchi   CV:   RRR, grade IV/VI crescendo/decrescendo murmur heard best at RSB,  no diastolic murmur  Abdomen:  soft, non-tender, no masses   Extremities:  warm, well-perfused, pulses diminished, no LE edema  Rectal/GU  Deferred  Neuro:   Grossly non-focal and symmetrical throughout  Skin:   Clean and dry, no rashes, no breakdown   Diagnostic Tests:  Transthoracic Echocardiography  Patient:  Xinyan, Bun MR #:    [id number] Study Date: 07/30/2015 Gender:   F Age:    61 Height:   157.5 cm Weight:   70.8 kg BSA:    1.78 m^2 Pt. Status: Room:  ORDERING   Parrett, Tammy S REFERRING  Parrett, Tammy S ATTENDING  Loralie Champagne, M.D. SONOGRAPHER Wyatt Mage, RDCS PERFORMING  [facility], Outpatient  cc:  ------------------------------------------------------------------- LV EF: 60% -  65%  ------------------------------------------------------------------- Indications:   CHF (I50.9).  ------------------------------------------------------------------- History:  PMH:  Dyspnea. Chronic obstructive pulmonary disease. Risk factors: PNA. Murmur. Hypoxemia. Cough. Anemia. Current tobacco use. Hypertension. Dyslipidemia.  ------------------------------------------------------------------- Study Conclusions  - Left ventricle: The cavity size was normal. Wall thickness was increased in a pattern of mild LVH. Systolic function was normal. The estimated ejection fraction was in the range of  60% to 65%. Wall motion was normal; there were no regional wall motion abnormalities. Features are consistent with a pseudonormal left ventricular filling pattern, with concomitant abnormal relaxation and increased filling pressure (grade 2 diastolic dysfunction). - Aortic valve: Trileaflet; severely calcified leaflets. There was severe stenosis. Mean gradient (S): 58 mm Hg. Valve area (VTI): 1.15 cm^2. Valve area (Vmax): 0.94 cm^2. - Mitral valve: Moderately to severely calcified annulus. Moderately calcified leaflets . The findings are consistent with mild stenosis. There was trivial regurgitation. Mean gradient (D): 10 mm Hg. Valve area by pressure half-time: 2.61 cm^2. - Left atrium: The atrium was moderately dilated. - Right ventricle: The cavity size was normal. Systolic function was normal. - Pulmonary arteries: No complete TR doppler jet so unable to estimate PA systolic pressure. - Inferior vena cava: The vessel was normal in size. The respirophasic diameter changes were in the normal range (= 50%), consistent with normal central venous pressure.  Impressions:  - Normal LV size with mild LV hypertrophy. EF 60-65%. Moderate diastolic dysfunction. Normal RV size and systolic function. Severe aortic stenosis by mean gradient, AVA 1.1 cm^2 by VTI, 0.9 cm^2 by Vmax. I think that the AS is severe. The mitral valve and annulus are heavily calcified. Mean gradient across the valve is 10 mmHg but MVA by PHT is 2.6 cm^2. I do not think that the mitral stenosis is more than mild.  ------------------------------------------------------------------- Labs, prior tests, procedures, and surgery: Transthoracic echocardiography (05/14/2010).   EF was 60%.  Transthoracic echocardiography. M-mode, complete 2D, spectral Doppler, and color Doppler. Birthdate: Patient birthdate: April 10, 1946. Age: Patient is 70 yr old. Sex: Gender: female. BMI:  28.5 kg/m^2. Blood pressure:   148/80 Patient status: Outpatient. Study date: Study date: 07/30/2015. Study time: 09:41 AM. Location: [facility]  -------------------------------------------------------------------  ------------------------------------------------------------------- Left ventricle: The cavity size was normal. Wall thickness was increased in a pattern of mild LVH. Systolic function was normal. The estimated ejection fraction was in the range of 60% to 65%. Wall motion was normal; there were no regional wall motion abnormalities. Features are consistent with a pseudonormal left ventricular filling pattern, with concomitant abnormal relaxation and increased filling pressure (grade 2 diastolic dysfunction).  -------------------------------------------------------------------  Aortic valve:  Trileaflet; severely calcified leaflets. Doppler: There was severe stenosis.  There was no regurgitation.  VTI ratio of LVOT to aortic valve: 0.36. Valve area (VTI): 1.15 cm^2. Indexed valve area (VTI): 0.65 cm^2/m^2. Peak velocity ratio of LVOT to aortic valve: 0.33. Valve area (Vmax): 0.94 cm^2. Indexed valve area (Vmax): 0.58 cm^2/m^2. Mean velocity ratio of LVOT to aortic valve: 0.33. Valve area (Vmean): 1.04 cm^2. Indexed valve area (Vmean): 0.58 cm^2/m^2.  Mean gradient (S): 58 mm Hg. Peak gradient (S): 108 mm Hg.  ------------------------------------------------------------------- Aorta: Aortic root: The aortic root was normal in size. Ascending aorta: The ascending aorta was normal in size.  ------------------------------------------------------------------- Mitral valve:  Moderately to severely calcified annulus. Moderately calcified leaflets . Doppler:  The findings are consistent with mild stenosis.  There was trivial regurgitation. Valve area by pressure half-time: 2.61 cm^2. Indexed valve area by pressure half-time: 1.47 cm^2/m^2. Indexed  valve area by continuity equation (using LVOT flow): 1.32 cm^2/m^2.  Mean gradient (D): 10 mm Hg. Peak gradient (D): 19 mm Hg.  ------------------------------------------------------------------- Left atrium: The atrium was moderately dilated.  ------------------------------------------------------------------- Right ventricle: The cavity size was normal. Systolic function was normal.  ------------------------------------------------------------------- Pulmonic valve:  Structurally normal valve.  Cusp separation was normal. Doppler: Transvalvular velocity was within the normal range. There was no regurgitation.  ------------------------------------------------------------------- Tricuspid valve:  Doppler: There was no significant regurgitation.  ------------------------------------------------------------------- Pulmonary artery:  No complete TR doppler jet so unable to estimate PA systolic pressure.  ------------------------------------------------------------------- Right atrium: The atrium was normal in size.  ------------------------------------------------------------------- Pericardium: There was no pericardial effusion.  ------------------------------------------------------------------- Systemic veins: Inferior vena cava: The vessel was normal in size. The respirophasic diameter changes were in the normal range (= 50%), consistent with normal central venous pressure.  ------------------------------------------------------------------- Measurements  Left ventricle              Value      Reference LV ID, ED, PLAX chordal     (L)   39.2  mm    43 - 52 LV ID, ES, PLAX chordal     (L)   21.2  mm    23 - 38 LV fx shortening, PLAX chordal      46   %    >=29 LV PW thickness, ED           11.9  mm    --------- IVS/LV PW ratio, ED       (H)   1.34      <=1.3 Stroke  volume, 2D            136  ml    --------- Stroke volume/bsa, 2D          76   ml/m^2  --------- LV ejection fraction, 1-p A4C      83   %    --------- LV end-diastolic volume, 2-p       44   ml    --------- LV end-systolic volume, 2-p       11   ml    --------- LV ejection fraction, 2-p        75   %    --------- Stroke volume, 2-p            33   ml    --------- LV end-diastolic volume/bsa, 2-p     25   ml/m^2  --------- LV end-systolic volume/bsa, 2-p     6   ml/m^2  --------- Stroke volume/bsa, 2-p  18.5  ml/m^2  --------- LV e&', lateral              9.12  cm/s   --------- LV E/e&', lateral             24.15      --------- LV e&', medial              5.97  cm/s   --------- LV E/e&', medial             36.9      --------- LV e&', average              7.55  cm/s   --------- LV E/e&', average             29.2      ---------  Ventricular septum            Value      Reference IVS thickness, ED            15.9  mm    ---------  LVOT                   Value      Reference LVOT ID, S                20   mm    --------- LVOT area                3.14  cm^2   --------- LVOT peak velocity, S          172  cm/s   --------- LVOT mean velocity, S          119  cm/s   --------- LVOT VTI, S               43.4  cm    --------- LVOT peak gradient, S          12   mm Hg  ---------  Aortic valve               Value      Reference Aortic valve peak velocity, S      519  cm/s   --------- Aortic valve mean velocity, S      361  cm/s   --------- Aortic  valve VTI, S           119  cm    --------- Aortic mean gradient, S         58   mm Hg  --------- Aortic peak gradient, S         108  mm Hg  --------- VTI ratio, LVOT/AV            0.36      --------- Aortic valve area, VTI          1.15  cm^2   --------- Aortic valve area/bsa, VTI        0.65  cm^2/m^2 --------- Velocity ratio, peak, LVOT/AV      0.33      --------- Aortic valve area, peak velocity     1.04  cm^2   --------- Aortic valve area/bsa, peak       0.58  cm^2/m^2 --------- velocity Velocity ratio, mean, LVOT/AV      0.33      --------- Aortic valve area, mean velocity     1.04  cm^2   --------- Aortic valve area/bsa, mean       0.58  cm^2/m^2 --------- velocity  Aorta                  Value      Reference Aortic root ID, ED            27   mm    ---------  Left atrium               Value      Reference LA ID, A-P, ES              44   mm    --------- LA ID/bsa, A-P          (H)   2.47  cm/m^2  <=2.2 LA volume, S               90   ml    --------- LA volume/bsa, S             50.5  ml/m^2  --------- LA volume, ES, 1-p A4C          92   ml    --------- LA volume/bsa, ES, 1-p A4C        51.6  ml/m^2  --------- LA volume, ES, 1-p A2C          89   ml    --------- LA volume/bsa, ES, 1-p A2C        50   ml/m^2  ---------  Mitral valve               Value      Reference Mitral E-wave peak velocity       220.29 cm/s   --------- Mitral A-wave peak velocity       172  cm/s   --------- Mitral mean velocity, D         150  cm/s   --------- Mitral deceleration slope        758.33 cm/s^2   --------- Mitral deceleration time     (H)   290  ms    150 - 230 Mitral pressure half-time        84   ms    --------- Mitral mean gradient, D         10   mm Hg  --------- Mitral peak gradient, D         19   mm Hg  --------- Mitral E/A ratio, peak          1.28      --------- Mitral valve area, PHT, DP        2.61  cm^2   --------- Mitral valve area/bsa, PHT, DP      1.47  cm^2/m^2 --------- Mitral valve area/bsa, LVOT       1.32  cm^2/m^2 --------- continuity Mitral annulus VTI, D          58   cm    ---------  Right ventricle             Value      Reference RV s&', lateral, S            14.9  cm/s   ---------  Legend: (L) and (H) mark values outside specified reference range.  ------------------------------------------------------------------- Prepared and Electronically Authenticated by  Loralie Champagne, M.D. 2016-11-07T13:18:02   CARDIAC CATHETERIZATION  Procedures    Right/Left Heart Cath and Coronary Angiography    Conclusion    1. Mildly elevated left heart filling pressure.  2. 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  3. Nonobstructive mild CAD.   She will undergo evaluation for TAVR.  She will need to resume Lovenox injections tonight.   CKD: 30 cc contrast used. Will hydrate post-procedure.     Technique and Indications    Procedure: Right Heart Cath, Selective Coronary Angiography  Indication: Severe aortic stenosis.   Procedural Details: The right radial and brachial areas were prepped, draped, and anesthetized with 1% lidocaine. There was a pre-existing peripheral IV in the right brachial area that was replaced with a 34F venous sheath. A Swan-Ganz catheter was used for the right heart catheterization. Standard protocol was followed for  recording of right heart pressures and sampling of oxygen saturations. Fick cardiac output was calculated. The right radial artery was entered using modified Seldinger technique and a 34F sheath was placed. The patient received weight-based IV heparin and 3 mg IA verapamil. Standard Judkins catheters were used for selective coronary angiography. There were no immediate procedural complications. The patient was transferred to the post catheterization recovery area for further monitoring.  Estimated blood loss <50 mL.    Coronary Findings    Dominance: Right   Left Main  No significant disease.     Left Anterior Descending  Luminal irregularities in the LAD. 40% proximal D1 stenosis.     Left Circumflex  Luminal irregularities.     Right Coronary Artery  Luminal irregularities.       Right Heart Pressures [facility] Procedural Findings: Hemodynamics (mmHg) RA mean 4 RV 45/5 PA 43/15, mean 31 PCWP mean 18  Oxygen saturations: PA 65% AO 98%  Cardiac Output (Fick) 5.14  Cardiac Index (Fick) 3.02  PVR 2.5 WU    Wall Motion    Not done, CKD and known severe AS.           Implants    Name ID Temporary Type Supply   No information to display    PACS Images    Show images for Cardiac catheterization     Link to Procedure Log    Procedure Log      Hemo Data       Most Recent Value   Fick Cardiac Output  5.14 L/min   Fick Cardiac Output Index  3.02 (L/min)/BSA   RA A Wave  7 mmHg   RA V Wave  4 mmHg   RA Mean  4 mmHg   RV Systolic Pressure  45 mmHg   RV Diastolic Pressure  -1 mmHg   RV EDP  5 mmHg   PA Systolic Pressure  43 mmHg   PA Diastolic Pressure  15 mmHg   PA Mean  31 mmHg   PW A Wave  22 mmHg   PW V Wave  17 mmHg   PW Mean  18 mmHg   AO Systolic Pressure  123456 mmHg   AO Diastolic Pressure  67 mmHg   AO Mean  89 mmHg   QP/QS  1   TPVR Index  10.25 HRUI   TSVR Index  29.43 HRUI   PVR SVR Ratio  0.15   TPVR/TSVR  Ratio  0.35    Cardiac TAVR CT TECHNIQUE: The patient was scanned on a Philips 256 scanner. A 120 kV retrospective scan was triggered in the descending thoracic aorta at 111 HU's. Gantry rotation speed was 270 msecs and collimation was .9 mm. No beta blockade or nitro were given. The 3D data set was reconstructed in 5% intervals of the R-R cycle. Systolic and diastolic phases  were analyzed on a dedicated work station using MPR, MIP and VRT modes. The patient received 80 cc of contrast. FINDINGS: Aortic Valve: Trileaflet and calcified. Small area of annular calcification at the base of the right leaflet. Calcification of the intervalvular fibrosa below the annulus at the base of the anterior mitral leaflet Severe LAE with dilated LAA. No thrombus Aorta: Normal sized root with normal origin of great vessels. Calcification of the STJ at the RCA ostium. Moderate calcification of the arch and descending thoracic aorta Sinotubular Junction: 25 mm Ascending Thoracic Aorta: 32 mm Aortic Arch: 26 mm Descending Thoracic Aorta: 24 mm Sinus of Valsalva Measurements: Non-coronary: 28.5 mm Right -coronary: 27 mm Left -coronary: 28 mm Coronary Artery Height above Annulus: Left Main: 16.3 mm above annulus Right Coronary: 14 mm above annulus Virtual Basal Annulus Measurements: Maximum/Minimum Diameter: 24.9 mm x 21.3 mm Perimeter: 76 mm Area: 442 mm2 Coronary Arteries: Suitable height above annulus for delivery Optimum Fluoroscopic Angle for Delivery: LAO 27 degrees Cranial 1 degree IMPRESSION: 1) Calcified trileaflet aortic valve with annulus 442 mm2 suitable for a 26 mm Sapien 3 valve 2) Suitable coronary height for deployment 3) Optimum angle for deployment LAO 27 degrees Cranial 1 degree 4) No LAA thrombus severely dilated LA/LAA 5) Calcification of the descending thoracic aorta and arch no aneurysm normal sized aortic root Jenkins Rouge Electronically Signed  By:  Jenkins Rouge M.D.  On: 09/28/2015 17:14     Study Result     EXAM: OVER-READ INTERPRETATION CT CHEST  The following report is an over-read performed by radiologist Dr. Rebekah Chesterfield [facility] on 09/28/2015. This over-read does not include interpretation of cardiac or coronary anatomy or pathology. The coronary calcium score/coronary CTA interpretation by the cardiologist is attached.  COMPARISON: No priors.  FINDINGS: 7 x 5 mm (mean diameter 6 mm) left upper lobe nodule (image 80 of series 412). Mild diffuse bronchial wall thickening with mild centrilobular and paraseptal emphysema. Within the visualized portions of the thorax there is no acute consolidative airspace disease, no pleural effusion and no pneumothorax. Multiple enlarged and borderline enlarged mediastinal and right hilar lymph nodes are noted, measuring up to 11 mm in the right hilar region, 11 mm in the prevascular nodal station, 12 mm in the subcarinal nodal station and 11 mm in the low right paratracheal nodal station. Sub cm low-attenuation lesions in segment 4A of the liver are too small to definitively characterize, but favored to represent small cysts. Small hiatal hernia. There are no aggressive appearing lytic or blastic lesions noted in the visualized portions of the skeleton.  IMPRESSION: 1. Mediastinal and right hilar lymphadenopathy. This is of uncertain etiology and significance, but clinical correlation for signs and symptoms of lymphoproliferative disorder is suggested. 2. In addition, there is a nodule in the left upper lobe (image 80 of series 412) which has a mean diameter of 6 mm. Given the smoking related changes in the lungs, the patient is at high risk for bronchogenic carcinoma, and accordingly a follow-up chest CT at 6-12 months is recommended. This recommendation follows the consensus statement: Guidelines for Management of Small Pulmonary Nodules Detected on  CT Scans: A Statement from the [place] as published in Radiology 2005;237:395-400. 3. Additional incidental findings, as above.  Electronically Signed: By: Vinnie Langton M.D. On: 09/28/2015 11:01     CT ANGIOGRAPHY CHEST, ABDOMEN AND PELVIS  TECHNIQUE: Multidetector CT imaging through the chest, abdomen and pelvis was performed using the standard protocol during bolus administration  of intravenous contrast. Multiplanar reconstructed images and MIPs were obtained and reviewed to evaluate the vascular anatomy.  CONTRAST: 40mL OMNIPAQUE IOHEXOL 350 MG/ML SOLN  COMPARISON: Cardiac CTA 09/28/2015. Chest CT 05/27/2005.  FINDINGS: CTA CHEST FINDINGS  Mediastinum/Lymph Nodes: Heart size is mildly enlarged. There is no significant pericardial fluid, thickening or pericardial calcification. There is atherosclerosis of the thoracic aorta, the great vessels of the mediastinum and the coronary arteries, including calcified atherosclerotic plaque in the left main, left anterior descending, left circumflex and right coronary arteries. Severe thickening calcification of the aortic valve, mitral valve and mitral annulus. Multiple borderline enlarged and mildly enlarged mediastinal and bilateral hilar lymph nodes, measuring up to 11 mm in short axis in the AP window nodal station. Esophagus is unremarkable in appearance. No axillary lymphadenopathy.  Lungs/Pleura: 7 x 5 mm nodule in the left upper lobe again noted (image 33 of series 8), which is only slightly larger than remote prior study 05/27/2005, strongly favored to be benign. 10 x 9 mm pleural-based nodule in the medial aspect of the apex of the right upper lobe (image 16 of series 8), new compared to prior study 05/27/2005. 7 mm subpleural nodule in the periphery of the lateral segment of the right middle lobe (image 100 of series 8), unchanged compared to 05/27/2005, considered benign, presumably a  subpleural lymph node. Small thick-walled cavitary area measuring approximately 1.4 x 0.8 cm in the periphery of the right lower lobe (image 118 of series 8) immediately above the right hemidiaphragm, with a small amount of surrounding ground-glass attenuation and septal thickening. Small calcified granuloma in the right lower lobe. Mild diffuse ground-glass attenuation and septal thickening in the lungs bilaterally, suggesting a background of mild interstitial pulmonary edema. Mild diffuse bronchial wall thickening with mild centrilobular and paraseptal emphysema.  Musculoskeletal/Soft Tissues: There are no aggressive appearing lytic or blastic lesions noted in the visualized portions of the skeleton.  CTA ABDOMEN AND PELVIS FINDINGS  Hepatobiliary: A few scattered low-attenuation lesions are noted in the liver, compatible with simple cysts, the largest of which measures 11 mm in segment 4 day. No other suspicious hepatic lesions are noted. No intra or extrahepatic biliary ductal dilatation. Gallbladder is presumably surgically absent (no surgical clips are noted in the gallbladder fossa).  Pancreas: No pancreatic mass. No pancreatic ductal dilatation. No pancreatic or peripancreatic fluid or inflammatory changes.  Spleen: Unremarkable.  Adrenals/Urinary Tract: Severe atrophy of the right kidney. Multifocal cortical thinning in the kidneys bilaterally, presumably areas of chronic post infectious or inflammatory scarring. Multiple low-attenuation lesions in the kidneys bilaterally, largest of which are compatible with simple cysts, which measure up to 1.5 cm in diameter in the lateral aspect of the interpolar region of the right kidney. Multiple other smaller sub cm low-attenuation lesions in the kidneys bilaterally are too small to definitively characterize. Mild irregularity of the adrenal glands, without definite dominant nodule. No hydroureteronephrosis. Urinary  bladder is normal in appearance.  Stomach/Bowel: Normal appearance of the stomach. No pathologic dilatation of small bowel or colon. Appendix is not identified, likely surgically absent. Regardless, there are no inflammatory changes adjacent to the cecum to suggest presence of an acute appendicitis at this time.  Vascular/Lymphatic: Extensive atherosclerosis throughout the abdominal and pelvic vasculature, with vascular findings and measurements pertinent to potential TAVR procedure, as detailed below. No aneurysm or dissection identified. No lymphadenopathy noted in the abdomen or pelvis.  Reproductive: Status post hysterectomy. Ovaries are not confidently identified may be surgically absent or atrophic.  Other: No significant volume of ascites. No pneumoperitoneum.  Musculoskeletal: Multiple small well-defined sclerotic lesions with narrow zones of transition are noted throughout the visualized axial and appendicular skeleton, predominantly in the bony pelvis and lower lumbar spine, favored to represent bone islands. There are no other aggressive appearing lytic or blastic lesions noted in the visualized portions of the skeleton.  VASCULAR MEASUREMENTS PERTINENT TO TAVR:  AORTA:  Minimal Aortic Diameter - 11 x 11 mm  Severity of Aortic Calcification - severe  RIGHT PELVIS:  Right Common Iliac Artery -  Minimal Diameter - 6.1 x 6.7 mm  Tortuosity - mild to moderate  Calcification - moderate to severe  Right External Iliac Artery -  Minimal Diameter - 6.7 x 6.4 mm  Tortuosity - mild  Calcification - mild  Right Common Femoral Artery -  Minimal Diameter - 5.7 x 7.0 mm  Tortuosity - mild  Calcification - mild to moderate  LEFT PELVIS:  Left Common Iliac Artery -  Minimal Diameter - 5.8 x 8.0 mm  Tortuosity - moderate  Calcification - moderate to severe  Left External Iliac Artery -  Minimal Diameter - 7.1 x 7.6  mm  Tortuosity - mild  Calcification - mild  Left Common Femoral Artery -  Minimal Diameter - 7.0 x 5.4 mm  Tortuosity - mild  Calcification - moderate  Review of the MIP images confirms the above findings.  IMPRESSION: 1. Vascular findings and measurements pertinent to potential TAVR procedure, as detailed above. This patient does appear to have suitable pelvic arterial access bilaterally. 2. Severe thickening calcification of the aortic valve, compatible with the patient's reported clinical history of severe aortic stenosis. There is also severe thickening calcification of the mitral valve and mitral annulus. 3. Multiple pulmonary nodules, largest of which is a pleural-based 1.0 x 0.9 cm nodule in the medial aspect of the apex of the right upper lobe. This could represent an area of post infectious or inflammatory scarring, however, neoplasm is not excluded. In addition, there is a thick-walled cavitary area in the periphery of the right lower lobe measuring 1.4 x 0.8 cm (image 118 of series 8), which is favored to be post infectious/inflammatory. Attention on repeat chest CT is suggested in the next 2-3 months to ensure the stability or resolution of these findings. 4. In addition, there is mediastinal and bilateral hilar lymphadenopathy, similar to the recent prior examination. Clinical correlation for signs and symptoms of lymphoproliferative disorder is recommended. However, given the evidence for mild interstitial pulmonary edema in the lungs, the low-level lymphadenopathy may simply be related to underlying congestive heart failure. 5. Mild cardiomegaly. 6. Atherosclerosis, including left main and 3 vessel coronary artery disease. Assessment for potential risk factor modification, dietary therapy or pharmacologic therapy may be warranted, if clinically indicated. 7. Mild diffuse bronchial wall thickening with mild centrilobular and paraseptal  emphysema. 8. Additional incidental findings, as above.   Electronically Signed  By: Vinnie Langton M.D.  On: 10/12/2015 17:42   Pulmonary Function Tests  Baseline      Post-bronchodilator  FVC  1.70 L  (66% predicted) FVC  1.96 L  (75% predicted) FEV1  1.03 L  (52% predicted) FEV1  1.10 L  (56% predicted) FEF25-75 0.45 L  (26% predicted) FEF25-75 0.61 L  (36% predicted)  TLC  4.11 L  (89% predicted) RV  1.96 L  (94% predicted) DLCO  57% predicted    STS Risk Calculator  Procedure    AVR  Risk of Mortality  6.4% Morbidity or Mortality  36.5% Prolonged LOS   21.8% Short LOS    14.6% Permanent Stroke   3.5% Prolonged Vent Support  30.1% DSW Infection    0.3% Renal Failure    11.7% Reoperation    11.5%   Impression:  Patient has stage D severe symptomatic aortic stenosis. She presents with progressive symptoms of exertional shortness of breath consistent with chronic diastolic congestive heart failure, New York Heart Association functional class II. I have personally reviewed the patient's recent transthoracic echocardiogram and diagnostic cardiac catheterization. Echocardiogram demonstrates the presence of severe thickening, calcification, and restricted leaflet mobility involving all 3 leaflets of the patient's aortic valve. Peak velocity across the aortic valve measures In excess of 5 m/s, corresponding to mean transvalvular gradient estimated 58 mmHg. Left ventricular systolic function is preserved. Diagnostic cardiac catheterization is notable for the absence of significant coronary artery disease and the presence of mild pulmonary hypertension. Risks associated with conventional surgical aortic valve replacement would unquestionably be high because of the patient's age and numerous comorbid medical problems including moderate to severe COPD, history of stroke in the past with reported hypercoagulable state on long-term warfarin anticoagulation, chronic kidney  disease, and severe rheumatoid arthritis with limited physical mobility.  Cardiac gated CT angiogram of the heart demonstrates the presence of aortic stenosis with anatomical characteristics suitable for transcatheter aortic valve replacement without any complicating features. CT angiogram of the abdomen and pelvis demonstrates moderate atherosclerotic disease with what appears to be adequate pelvic vascular access to facilitate a transfemoral approach.   Plan:  The patient and her husband were counseled at length regarding treatment alternatives for management of severe symptomatic aortic stenosis. Alternative approaches such as conventional aortic valve replacement, transcatheter aortic valve replacement, and palliative medical therapy were compared and contrasted at length.  The risks associated with conventional surgical aortic valve replacement were been discussed in detail, as were expectations for post-operative convalescence. Long-term prognosis with medical therapy was discussed. This discussion was placed in the context of the patient's own specific clinical presentation and past medical history.  All of their questions been addressed.  The patient is to proceed with transcatheter aortic valve replacement in the near future.  Following the decision to proceed with transcatheter aortic valve replacement, a discussion has been held regarding what types of management strategies would be attempted intraoperatively in the event of life-threatening complications, including whether or not the patient would be considered a candidate for the use of cardiopulmonary bypass and/or conversion to open sternotomy for attempted surgical intervention.  The patient has been advised of a variety of complications that might develop including but not limited to risks of death, stroke, paravalvular leak, aortic dissection or other major vascular complications, aortic annulus rupture, device embolization, cardiac rupture  or perforation, mitral regurgitation, acute myocardial infarction, arrhythmia, heart block or bradycardia requiring permanent pacemaker placement, congestive heart failure, respiratory failure, renal failure, pneumonia, infection, other late complications related to structural valve deterioration or migration, or other complications that might ultimately cause a temporary or permanent loss of functional independence or other long term morbidity.  The patient provides full informed consent for the procedure as described and all questions were answered.  We tentatively plan to proceed with transcatheter aortic valve replacement via transfemoral approach on Tuesday, 10/30/2015. The patient has been instructed to stop taking warfarin 5 days prior to surgery. She will be bridged using Lovenox injections.    I spent in excess of 90 minutes during the conduct of  this office consultation and >50% of this time involved direct face-to-face encounter with the patient for counseling and/or coordination of their care.   Valentina Gu. Roxy Manns, MD 10/16/2015 4:12 PM

## 2015-10-16 NOTE — Telephone Encounter (Signed)
INR yesterday- 2.1 Present dose of coumadin- 2.5 mg alt 5 mg Patient to continue on the same dose and stated understanding. Patient to have repeat lab on 10/31/15

## 2015-10-17 ENCOUNTER — Telehealth: Payer: Self-pay | Admitting: Pharmacist

## 2015-10-17 ENCOUNTER — Other Ambulatory Visit: Payer: Self-pay | Admitting: *Deleted

## 2015-10-17 DIAGNOSIS — I350 Nonrheumatic aortic (valve) stenosis: Secondary | ICD-10-CM

## 2015-10-17 LAB — BRAIN NATRIURETIC PEPTIDE: Brain Natriuretic Peptide: 882.8 pg/mL — ABNORMAL HIGH (ref 0.0–100.0)

## 2015-10-17 NOTE — Telephone Encounter (Signed)
-----   Message from Laury Deep, RN sent at 10/17/2015 11:11 AM EST ----- Thanks so much.  I will call the patient will all the info and let her know she will be getting a call about the Lovenox.  Per Dr. Roxy Manns I will tell her to stop 5 days and tell her to check in early surgery day for hydration! :)   Have a good day,  Ryan ----- Message -----    From: Burnell Blanks, MD    Sent: 10/17/2015  11:09 AM      To: Aris Georgia, RPH, Laury Deep, RN, #  Ryan, She can come in early for hydration. We can run her fluids at 75 cc/hour on arrival. I will send this message also to the PharmDs in our coumadin clinic to get their help with Lovenox.   Thanks,  Gerald Stabs  ----- Message -----    From: Laury Deep, RN    Sent: 10/17/2015   9:37 AM      To: Burnell Blanks, MD  Hey,   This patient is scheduled for TAVR-TF surgery on Tuesday 2/7, 2nd case with you and Dr. Cyndia Bent.  We are going to stop her coumadin 5 days prior and she needs Lovenox bridging.  Who do I need to message in the coumadin clinic to help with this?  Also, since she is 2nd case so you want her to come in early for hydration or does she needs admitted the night before?  What do you want her fluids going at on arrival?  Sorry for the 1000 questions!  Thanks so much,  [employer]

## 2015-10-17 NOTE — Telephone Encounter (Signed)
Spoke with pt.  She will be coming to the clinic on 1/30 for Lovenox instructions.

## 2015-10-22 ENCOUNTER — Ambulatory Visit (INDEPENDENT_AMBULATORY_CARE_PROVIDER_SITE_OTHER): Payer: Medicare HMO | Admitting: *Deleted

## 2015-10-22 DIAGNOSIS — D689 Coagulation defect, unspecified: Secondary | ICD-10-CM | POA: Diagnosis not present

## 2015-10-22 LAB — POCT INR: INR: 2.5

## 2015-10-22 MED ORDER — ENOXAPARIN SODIUM 80 MG/0.8ML ~~LOC~~ SOLN: 80.0000 mg | SUBCUTANEOUS | Status: DC

## 2015-10-22 NOTE — Patient Instructions (Signed)
Take your normal dose of Coumadin until 10/24/15.  10/24/15- Last Coumadin dose  10/25/15- Do no Coumadin and no lovenox  10/26/15- Start Lovenox 80mg s injection into fatty abdominal tissue, 2 inches away from navel at 7pm. Rotate Sites .  10/27/15- Continue Lovenox 80mg s injection into fatty abdominal tissue, 2 inches away from navel at 7pm. Rotate Sites.   10/28/15- Take your last  Lovenox 80mg s injection into fatty abdominal tissue, 2 inches away from navel at 7pm. Rotate Sites  10/29/15- Do not take any Lovenox this night  10/30/15- Day surgery- no lovenox, report to hospital as scheduled.   Call us when discharged to Coumadin Clinic @ [phone] for further instructions and to schedule your  INR follow up.

## 2015-10-25 ENCOUNTER — Other Ambulatory Visit (HOSPITAL_COMMUNITY): Payer: Self-pay | Admitting: *Deleted

## 2015-10-26 ENCOUNTER — Encounter (HOSPITAL_COMMUNITY): Payer: Self-pay

## 2015-10-26 ENCOUNTER — Ambulatory Visit (HOSPITAL_COMMUNITY)
Admission: RE | Admit: 2015-10-26 | Discharge: 2015-10-26 | Disposition: A | Discharge status: Home / Self Care | Payer: Medicare HMO | Source: Ambulatory Visit | Attending: Cardiovascular Disease | Admitting: Cardiovascular Disease

## 2015-10-26 ENCOUNTER — Encounter (HOSPITAL_COMMUNITY)
Admission: RE | Admit: 2015-10-26 | Discharge: 2015-10-26 | Disposition: A | Discharge status: Home / Self Care | Payer: Medicare HMO | Source: Ambulatory Visit | Attending: Cardiovascular Disease | Admitting: Cardiovascular Disease

## 2015-10-26 VITALS — BP 130/62 | HR 86 | Temp 98.3°F | Resp 18 | Ht 63.5 in | Wt 154.0 lb

## 2015-10-26 DIAGNOSIS — I4510 Unspecified right bundle-branch block: Secondary | ICD-10-CM | POA: Insufficient documentation

## 2015-10-26 DIAGNOSIS — Z01818 Encounter for other preprocedural examination: Secondary | ICD-10-CM | POA: Insufficient documentation

## 2015-10-26 DIAGNOSIS — I350 Nonrheumatic aortic (valve) stenosis: Secondary | ICD-10-CM

## 2015-10-26 DIAGNOSIS — Z01812 Encounter for preprocedural laboratory examination: Secondary | ICD-10-CM | POA: Insufficient documentation

## 2015-10-26 DIAGNOSIS — Z01810 Encounter for preprocedural cardiovascular examination: Secondary | ICD-10-CM | POA: Insufficient documentation

## 2015-10-26 HISTORY — DX: Cardiac murmur, unspecified: R01.1

## 2015-10-26 HISTORY — DX: Reserved for inherently not codable concepts without codable children: IMO0001

## 2015-10-26 HISTORY — DX: Major depressive disorder, single episode, unspecified: F32.9

## 2015-10-26 HISTORY — DX: Cerebral infarction, unspecified: I63.9

## 2015-10-26 HISTORY — DX: Pneumonia, unspecified organism: J18.9

## 2015-10-26 HISTORY — DX: Depression, unspecified: F32.A

## 2015-10-26 LAB — BLOOD GAS, ARTERIAL
Acid-Base Excess: 1 mmol/L (ref 0.0–2.0)
Bicarbonate: 25 mEq/L — ABNORMAL HIGH (ref 20.0–24.0)
Drawn by: 206361
FIO2: 0.21
O2 Saturation: 90.1 %
PCO2 ART: 39.2 mmHg (ref 35.0–45.0)
PH ART: 7.42 (ref 7.350–7.450)
PO2 ART: 60.1 mmHg — AB (ref 80.0–100.0)
TCO2: 26.2 mmol/L (ref 0–100)

## 2015-10-26 LAB — CBC
HEMATOCRIT: 27.2 % — AB (ref 36.0–46.0)
HEMOGLOBIN: 8.1 g/dL — AB (ref 12.0–15.0)
MCH: 23.2 pg — AB (ref 26.0–34.0)
MCHC: 29.8 g/dL — AB (ref 30.0–36.0)
MCV: 77.9 fL — AB (ref 78.0–100.0)
Platelets: 349 10*3/uL (ref 150–400)
RBC: 3.49 MIL/uL — AB (ref 3.87–5.11)
RDW: 16.9 % — ABNORMAL HIGH (ref 11.5–15.5)
WBC: 11.6 10*3/uL — ABNORMAL HIGH (ref 4.0–10.5)

## 2015-10-26 LAB — APTT: aPTT: 62 seconds — ABNORMAL HIGH (ref 24–37)

## 2015-10-26 LAB — COMPREHENSIVE METABOLIC PANEL
ALK PHOS: 79 U/L (ref 38–126)
ALT: 12 U/L — AB (ref 14–54)
ANION GAP: 13 (ref 5–15)
AST: 16 U/L (ref 15–41)
Albumin: 2.3 g/dL — ABNORMAL LOW (ref 3.5–5.0)
BILIRUBIN TOTAL: 0.6 mg/dL (ref 0.3–1.2)
BUN: 41 mg/dL — ABNORMAL HIGH (ref 6–20)
CALCIUM: 9.6 mg/dL (ref 8.9–10.3)
CO2: 22 mmol/L (ref 22–32)
CREATININE: 2.47 mg/dL — AB (ref 0.44–1.00)
Chloride: 105 mmol/L (ref 101–111)
GFR, EST AFRICAN AMERICAN: 21 mL/min — AB (ref >60)
GFR, EST NON AFRICAN AMERICAN: 19 mL/min — AB (ref >60)
Glucose, Bld: 115 mg/dL — ABNORMAL HIGH (ref 65–99)
Potassium: 4.2 mmol/L (ref 3.5–5.1)
SODIUM: 140 mmol/L (ref 135–145)
TOTAL PROTEIN: 6.7 g/dL (ref 6.5–8.1)

## 2015-10-26 LAB — PROTIME-INR
INR: 2.13 — AB (ref 0.00–1.49)
Prothrombin Time: 23.6 seconds — ABNORMAL HIGH (ref 11.6–15.2)

## 2015-10-26 LAB — ABO/RH: ABO/RH(D): B POS

## 2015-10-26 LAB — SURGICAL PCR SCREEN
MRSA, PCR: NEGATIVE
Staphylococcus aureus: POSITIVE — AB

## 2015-10-26 NOTE — Pre-Procedure Instructions (Signed)
Karen Chambers  10/26/2015      [facility] [place] ([place]), [place] [id number] [place] ([place]) [place] [address] Phone: [phone] Fax: [phone]  [facility] [place], [place] [address] [place] [address] Phone: [phone] Fax: [phone]    Your procedure is scheduled on  10/30/2015  Report to [facility] Admitting at 6:30 A.M.  Call this number if you have problems the morning of surgery:  [phone]   Remember:  Do not eat food or drink liquids after midnight.  On Monday   Take these medicines the morning of surgery with A SIP OF WATER: Spiriva, Levothyroxine, Tramadol, Uloric   Do not wear jewelry, make-up or nail polish.   Do not wear lotions, powders, or perfumes.    Do not shave 48 hours prior to surgery.     Do not bring valuables to the hospital.   [facility] is not responsible for any belongings or valuables.  Contacts, dentures or bridgework may not be worn into surgery.  Leave your suitcase in the car.  After surgery it may be brought to your room.  For patients admitted to the hospital, discharge time will be determined by your treatment team.  Patients discharged the day of surgery will not be allowed to drive home.   Name and phone number of your driver:   With wife  Special instructions:  Special Instructions: [place] - Preparing for Surgery  Before surgery, you can play an important role.  Because skin is not sterile, your skin needs to be as free of germs as possible.  You can reduce the number of germs on you skin by washing with CHG (chlorahexidine gluconate) soap before surgery.  CHG is an antiseptic cleaner which kills germs and bonds with the skin to continue killing germs even after washing.  Please DO NOT use if you have an allergy to CHG or antibacterial soaps.  If your skin becomes reddened/irritated stop using the CHG and inform your  nurse when you arrive at Short Stay.  Do not shave (including legs and underarms) for at least 48 hours prior to the first CHG shower.  You may shave your face.  Please follow these instructions carefully:   1.  Shower with CHG Soap the night before surgery and the  morning of Surgery.  2.  If you choose to wash your hair, wash your hair first as usual with your  normal shampoo.  3.  After you shampoo, rinse your hair and body thoroughly to remove the  Shampoo.  4.  Use CHG as you would any other liquid soap.  You can apply chg directly to the skin and wash gently with scrungie or a clean washcloth.  5.  Apply the CHG Soap to your body ONLY FROM THE NECK DOWN.    Do not use on open wounds or open sores.  Avoid contact with your eyes, ears, mouth and genitals (private parts).  Wash genitals (private parts)   with your normal soap.  6.  Wash thoroughly, paying special attention to the area where your surgery will be performed.  7.  Thoroughly rinse your body with warm water from the neck down.  8.  DO NOT shower/wash with your normal soap after using and rinsing off   the CHG Soap.  9.  Pat yourself dry with a clean towel.  10.  Wear clean pajamas.            11.  Place clean sheets on your bed the night of your first shower and do not sleep with pets.  Day of Surgery  Do not apply any lotions/deodorants the morning of surgery.  Please wear clean clothes to the hospital/surgery center.  Please read over the following fact sheets that you were given. Pain Booklet, Coughing and Deep Breathing, Blood Transfusion Information, MRSA Information and Surgical Site Infection Prevention

## 2015-10-26 NOTE — Progress Notes (Signed)
Call to Ryan at [facility] to clarify meds for a.m. Of surgery.

## 2015-10-26 NOTE — Progress Notes (Signed)
PFT's have been completed, pt. Given incentive spirometer at this appt. In PAT, given instructions as well with encouragement to be used several times per day preop.

## 2015-10-26 NOTE — Progress Notes (Signed)
I called a prescription for Mupirocin ointment to [facility] on [address], [place], [place]

## 2015-10-29 ENCOUNTER — Other Ambulatory Visit: Payer: Self-pay | Admitting: Internal Medicine

## 2015-10-29 LAB — HEMOGLOBIN A1C
Hgb A1c MFr Bld: 5.6 % (ref 4.8–5.6)
Mean Plasma Glucose: 114 mg/dL

## 2015-10-29 MED ORDER — CHLORHEXIDINE GLUCONATE 0.12 % MT SOLN
15.0000 mL | Freq: Once | OROMUCOSAL | Status: AC
  Administered 2015-10-30: 15 mL via OROMUCOSAL
  Filled 2015-10-29: qty 15

## 2015-10-29 MED ORDER — SODIUM CHLORIDE 0.9 % IV SOLN: INTRAVENOUS | Status: DC

## 2015-10-29 MED ORDER — NOREPINEPHRINE BITARTRATE 1 MG/ML IV SOLN
0.0000 ug/min | INTRAVENOUS | Status: AC
  Administered 2015-10-30: 1 ug/min via INTRAVENOUS
  Filled 2015-10-29: qty 4

## 2015-10-29 MED ORDER — DEXTROSE 5 % IV SOLN
30.0000 ug/min | INTRAVENOUS | Status: DC
  Filled 2015-10-29: qty 2

## 2015-10-29 MED ORDER — CHLORHEXIDINE GLUCONATE 4 % EX LIQD: 60.0000 mL | Freq: Once | CUTANEOUS | Status: DC

## 2015-10-29 MED ORDER — SODIUM CHLORIDE 0.9 % IV SOLN
INTRAVENOUS | Status: DC
  Filled 2015-10-29: qty 30

## 2015-10-29 MED ORDER — POTASSIUM CHLORIDE 2 MEQ/ML IV SOLN
80.0000 meq | INTRAVENOUS | Status: DC
  Filled 2015-10-29: qty 40

## 2015-10-29 MED ORDER — DOPAMINE-DEXTROSE 3.2-5 MG/ML-% IV SOLN
0.0000 ug/kg/min | INTRAVENOUS | Status: DC
  Filled 2015-10-29: qty 250

## 2015-10-29 MED ORDER — NITROGLYCERIN IN D5W 200-5 MCG/ML-% IV SOLN
2.0000 ug/min | INTRAVENOUS | Status: DC
  Filled 2015-10-29: qty 250

## 2015-10-29 MED ORDER — DEXMEDETOMIDINE HCL IN NACL 400 MCG/100ML IV SOLN
0.1000 ug/kg/h | INTRAVENOUS | Status: AC
  Administered 2015-10-30: .3 ug/kg/h via INTRAVENOUS
  Filled 2015-10-29: qty 100

## 2015-10-29 MED ORDER — SODIUM CHLORIDE 0.9 % IV SOLN
INTRAVENOUS | Status: AC
  Administered 2015-10-30: .9 [IU]/h via INTRAVENOUS
  Filled 2015-10-29: qty 2.5

## 2015-10-29 MED ORDER — VANCOMYCIN HCL 10 G IV SOLR
1250.0000 mg | INTRAVENOUS | Status: AC
  Administered 2015-10-30: 1250 mg via INTRAVENOUS
  Filled 2015-10-29: qty 1250

## 2015-10-29 MED ORDER — CHLORHEXIDINE GLUCONATE 4 % EX LIQD: 30.0000 mL | CUTANEOUS | Status: DC

## 2015-10-29 MED ORDER — EPINEPHRINE HCL 1 MG/ML IJ SOLN
0.0000 ug/min | INTRAMUSCULAR | Status: DC
  Filled 2015-10-29: qty 4

## 2015-10-29 MED ORDER — MAGNESIUM SULFATE 50 % IJ SOLN
40.0000 meq | INTRAMUSCULAR | Status: DC
  Filled 2015-10-29: qty 10

## 2015-10-29 MED ORDER — DEXTROSE 5 % IV SOLN
1.5000 g | INTRAVENOUS | Status: AC
  Administered 2015-10-30: 1.5 g via INTRAVENOUS
  Filled 2015-10-29 (×2): qty 1.5

## 2015-10-29 NOTE — H&P (Signed)
[address]       [name] [address]             [phone]      Cardiothoracic Surgery Admission History and Physical   Referring Provider is Larey Dresser, MD PCP is Aura Dials, PA-C  Chief Complaint  Patient presents with  . 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 She reports a history of a heart murmur since she was young and has had echos in the past without any abnormality. She had a COPD flare in October and was treated with prednisone. She continued to have shortness of breath and a CXR showed pulmonary edema. Her BNP was elevated and her murmur was felt to be different. She had an echo on 07/30/2015 that showed a trileaflet aortic valve with severely calcified leaflets and severe AS with a mean gradient of 58 mm Hg and a peak gradient of 108 mm Hg. The mitral valve and annulus are heavily calcified with a mean gradient of 10 mm Hg and a MVA by PHRT of 2.6 cm2 consistent with mild MS. She underwent cardiac cat on 08/09/2015 that showed mild non-obstructive CAD with mildly elevated left heart pressures and mild pulmonary hypertension.  She lives at home with her husband and is here today with her husband, son and daughter-in-law. She is independent with ADL's but is fairly sedentary according to her family. She is limited by her shortness of breath with mild activity and arthritic pain in her hands, knees and back.    Past Medical History  Diagnosis Date  . Other and unspecified hyperlipidemia 401.9  . Hypothyroidism   . Obesity, unspecified   . Gout   . Low back pain     buldging disc ,and herniated disc  .  Rheumatoid arthritis(714.0)   . Cough 11/12/2010  . Hypertension   . Coagulopathy ([place])   . COPD (chronic obstructive pulmonary disease) ([place])   . Complication of anesthesia     slow awaken from anesthesia  . Von Willebrand disease ([place])     Dr. Jana Hakim follows  . Aortic stenosis   . CAD (coronary artery disease)   . TIA (transient ischemic attack)     Past Surgical History  Procedure Laterality Date  . Cholecystectomy    . Total abdominal hysterectomy    . Stapedectomy Bilateral     right ear hearing aid  . Esophagogastroduodenoscopy (egd) with propofol N/A 06/23/2014    Procedure: ESOPHAGOGASTRODUODENOSCOPY (EGD) WITH PROPOFOL; Surgeon: Jerene Bears, MD; Location: [facility]; Service: Gastroenterology; Laterality: N/A;  . Colonoscopy with propofol N/A 06/23/2014    Procedure: COLONOSCOPY WITH PROPOFOL; Surgeon: Jerene Bears, MD; Location: [facility]; Service: Gastroenterology; Laterality: N/A;  . Cardiac catheterization N/A 08/09/2015    Procedure: Right/Left Heart Cath and Coronary Angiography; Surgeon: Larey Dresser, MD; Location: [facility]; Service: Cardiovascular; Laterality: N/A;    Family History  Problem Relation Age of Onset  . Cancer Father   . Heart disease Mother   . Heart attack Mother   . Hypertension Sister   . Stroke Neg Hx     Social History   Social History  .  Marital Status: Legally Separated    Spouse Name: N/A  . Number of Children: 2  . Years of Education: N/A   Occupational History  . Retired-Accounting Dept    Social History Main Topics  . Smoking status: Former Smoker -- 1.00 packs/day for 36 years    Types: Cigarettes    Quit date: 07/05/1999  . Smokeless tobacco: Never Used     Comment: 1ppd x 36 years  . Alcohol Use: No  . Drug Use: No  . Sexual  Activity: Not on file   Other Topics Concern  . Not on file   Social History Narrative    Current Outpatient Prescriptions  Medication Sig Dispense Refill  . acetaminophen (TYLENOL ARTHRITIS PAIN) 650 MG CR tablet Take 650-1,300 mg by mouth 2 (two) times daily.    Marland Kitchen amLODipine (NORVASC) 10 MG tablet Take 1 tablet (10 mg total) by mouth every evening. 90 tablet 4  . aspirin 81 MG tablet Take 81 mg by mouth at bedtime.     Marland Kitchen atorvastatin (LIPITOR) 20 MG tablet TAKE 1 TABLET DAILY. 90 tablet 1  . febuxostat (ULORIC) 40 MG tablet Take 40 mg by mouth every morning.     . furosemide (LASIX) 20 MG tablet Take 1 tablet (20 mg total) by mouth daily. 30 tablet 3  . levothyroxine (SYNTHROID, LEVOTHROID) 100 MCG tablet TAKE 1 TABLET DAILY 90 tablet 3  . losartan (COZAAR) 50 MG tablet Take 1 tablet (50 mg total) by mouth daily. 30 tablet 3  . metoprolol succinate (TOPROL-XL) 100 MG 24 hr tablet Take 1 tablet (100 mg total) by mouth 2 (two) times daily. Take with or immediately following a meal. 180 tablet 3  . Multiple Vitamin (MULTIVITAMIN) capsule Take 1 capsule by mouth daily at 12 noon.     . Omega 3 1200 MG CAPS Take 1,500 mg by mouth daily at 12 noon. 1200mg  softgel + 300mg  softgel.    . potassium chloride (K-DUR) 10 MEQ tablet Take 1 tablet (10 mEq total) by mouth daily. 30 tablet 3  . tiotropium (SPIRIVA HANDIHALER) 18 MCG inhalation capsule INHALE ONE CAPSULE ONCE DAILY 30 capsule 6  . traMADol (ULTRAM) 50 MG tablet Take 100 mg by mouth 2 (two) times daily.     Marland Kitchen warfarin (COUMADIN) 5 MG tablet TAKE 1 TABLET DAILY OR AS DIRECTED BY THE COUMADIN CLINIC 105 tablet 3   No current facility-administered medications for this visit.    Allergies  Allergen Reactions  . Allopurinol Itching, Nausea Only, Rash and Other (See Comments)    Top of head to tip of toes with rash; aches all over  .  Cephalexin Other (See Comments)    Severe case of thrush in mouth/tongue. Tongue raw and hard and swollen.  . Nitrofurantoin Hives, Itching, Nausea And Vomiting, Rash and Other (See Comments)    Body aches, like I have the flu  . Ace Inhibitors Other (See Comments)    cough  . Sulfonamide Derivatives Hives, Itching and Rash      Review of Systems:  General:normal appetite, normal energy, no weight gain, no weight loss, no fever Cardiac:mild chest pressure with exertion, no chest pain at rest, moderate SOB with mild exertion, no resting SOB, no PND, no orthopnea, no palpitations, no arrhythmia, no atrial fibrillation,no LE edema, no dizzy spells, p syncope Respiratory:chronic shortness of breath, uses home oxygen, no productive cough, no dry cough, no bronchitis, has wheezing, no hemoptysis, no asthma, no pain with inspiration or cough,  no sleep apnea, no CPAP at night GI:no difficulty swallowing, no reflux, n frequent heartburn, no hiatal hernia, no abdominal pain, no constipation, no diarrhea, no hematochezia, no hematemesis, no melena GU:no dysuria, no frequency, no urinary tract infection, no hematuria, no kidney stones, no kidney disease Vascular:no pain suggestive of claudication, no pain in feet, no leg cramps, no varicose veins, no DVT, no non-healing foot ulcer Neuro:previous stroke with numbness in fingers on right hand and right facial droop resolved, no TIA's, no seizures, no headaches, no temporary blindness one eye, no slurred speech, no peripheral neuropathy, no chronic pain, no instability of gait, no memory/cognitive dysfunction Musculoskeletal:rheumatoid arthritis, has joint swelling,  no myalgias, no difficulty walking, decreased mobility  Skin:no rash, no itching, no skin infections, no pressure sores or ulcerations Psych:no anxiety, no depression, no nervousness, no unusual recent stress Eyes:no blurry vision, has floaters, no recent vision changes, wears glasses  ENT:has hearing loss, no loose or painful teeth, wears upper dentures, last saw dentist this year Hematologic:has easy bruising, no abnormal bleeding, has clotting disorder, no frequent epistaxis Endocrine:no diabetes, does not check CBG's at home    Physical Exam:  BP 149/88 mmHg  Pulse 86  Resp 20  Ht 5' 2.5" (1.588 m)  Wt 155 lb (70.308 kg)  BMI 27.88 kg/m2  SpO2 93% General:Elderly, well-appearing woman in no distress HEENT:Unremarkable , NCAT, PERLA, EOMI, oropharynx clear Neck:no JVD, no bruits, no adenopathy or thyromegaly Chest:clear to auscultation, symmetrical but decreased breath sounds throughout, no wheezes, no rhonchi  CV:RRR, grade III/VI crescendo/decrescendo murmur heard best at RUSB, no diastolic murmur Abdomen:soft, non-tender, no masses or organomegaly Extremities:warm, well-perfused, pulses palpable dp and pt, no LE edema Rectal/GUDeferred Neuro:Grossly non-focal and symmetrical throughout Skin:Clean and dry, no rashes, no breakdown   Diagnostic Tests:  [facility]             [address] [place], [place] [address]              [phone]  ------------------------------------------------------------------- Transthoracic Echocardiography  Patient:  Karen Chambers, Karen Chambers MR #:    [id number] Study Date: 07/30/2015 Gender:   F Age:    83 Height:   157.5 cm Weight:   70.8 kg BSA:    1.78 m^2 Pt. Status: Room:  ORDERING   Parrett, Tammy S REFERRING  Parrett, Tammy S ATTENDING  Loralie Champagne, M.D. SONOGRAPHER Wyatt Mage, RDCS PERFORMING  [facility], Outpatient  cc:  ------------------------------------------------------------------- LV EF: 60% -  65%  ------------------------------------------------------------------- Indications:   CHF (I50.9).  ------------------------------------------------------------------- History:  PMH:  Dyspnea. Chronic obstructive pulmonary disease. Risk factors: PNA. Murmur. Hypoxemia. Cough. Anemia. Current tobacco use. Hypertension. Dyslipidemia.  ------------------------------------------------------------------- Study Conclusions  - Left ventricle: The cavity size was normal. Wall thickness was increased in a pattern of mild LVH. Systolic function was normal. The estimated ejection fraction was in the range of 60% to 65%. Wall motion was normal; there were no regional wall motion abnormalities. Features are consistent with a pseudonormal left ventricular filling pattern, with concomitant abnormal relaxation and increased filling pressure (grade 2 diastolic dysfunction). - Aortic valve: Trileaflet; severely calcified leaflets. There was severe stenosis. Mean gradient (S): 58 mm Hg. Valve area (VTI): 1.15 cm^2. Valve area (Vmax): 0.94 cm^2. - Mitral valve: Moderately to severely calcified annulus. Moderately calcified leaflets . The findings are consistent with mild stenosis. There was trivial  regurgitation. Mean gradient (D): 10 mm Hg. Valve area by  pressure half-time: 2.61 cm^2. - Left atrium: The atrium was moderately dilated. - Right ventricle: The cavity size was normal. Systolic function was normal. - Pulmonary arteries: No complete TR doppler jet so unable to estimate PA systolic pressure. - Inferior vena cava: The vessel was normal in size. The respirophasic diameter changes were in the normal range (= 50%), consistent with normal central venous pressure.  Impressions:  - Normal LV size with mild LV hypertrophy. EF 60-65%. Moderate diastolic dysfunction. Normal RV size and systolic function. Severe aortic stenosis by mean gradient, AVA 1.1 cm^2 by VTI, 0.9 cm^2 by Vmax. I think that the AS is severe. The mitral valve and annulus are heavily calcified. Mean gradient across the valve is 10 mmHg but MVA by PHT is 2.6 cm^2. I do not think that the mitral stenosis is more than mild.  ------------------------------------------------------------------- Labs, prior tests, procedures, and surgery: Transthoracic echocardiography (05/14/2010).   EF was 60%.  Transthoracic echocardiography. M-mode, complete 2D, spectral Doppler, and color Doppler. Birthdate: Patient birthdate: June 20, 1946. Age: Patient is 70 yr old. Sex: Gender: female. BMI: 28.5 kg/m^2. Blood pressure:   148/80 Patient status: Outpatient. Study date: Study date: 07/30/2015. Study time: 09:41 AM. Location: [facility]  -------------------------------------------------------------------  ------------------------------------------------------------------- Left ventricle: The cavity size was normal. Wall thickness was increased in a pattern of mild LVH. Systolic function was normal. The estimated ejection fraction was in the range of 60% to 65%. Wall motion was normal; there were no regional wall motion abnormalities. Features are consistent with a pseudonormal  left ventricular filling pattern, with concomitant abnormal relaxation and increased filling pressure (grade 2 diastolic dysfunction).  ------------------------------------------------------------------- Aortic valve:  Trileaflet; severely calcified leaflets. Doppler: There was severe stenosis.  There was no regurgitation.  VTI ratio of LVOT to aortic valve: 0.36. Valve area (VTI): 1.15 cm^2. Indexed valve area (VTI): 0.65 cm^2/m^2. Peak velocity ratio of LVOT to aortic valve: 0.33. Valve area (Vmax): 0.94 cm^2. Indexed valve area (Vmax): 0.58 cm^2/m^2. Mean velocity ratio of LVOT to aortic valve: 0.33. Valve area (Vmean): 1.04 cm^2. Indexed valve area (Vmean): 0.58 cm^2/m^2.  Mean gradient (S): 58 mm Hg. Peak gradient (S): 108 mm Hg.  ------------------------------------------------------------------- Aorta: Aortic root: The aortic root was normal in size. Ascending aorta: The ascending aorta was normal in size.  ------------------------------------------------------------------- Mitral valve:  Moderately to severely calcified annulus. Moderately calcified leaflets . Doppler:  The findings are consistent with mild stenosis.  There was trivial regurgitation. Valve area by pressure half-time: 2.61 cm^2. Indexed valve area by pressure half-time: 1.47 cm^2/m^2. Indexed valve area by continuity equation (using LVOT flow): 1.32 cm^2/m^2.  Mean gradient (D): 10 mm Hg. Peak gradient (D): 19 mm Hg.  ------------------------------------------------------------------- Left atrium: The atrium was moderately dilated.  ------------------------------------------------------------------- Right ventricle: The cavity size was normal. Systolic function was normal.  ------------------------------------------------------------------- Pulmonic valve:  Structurally normal valve.  Cusp separation was normal. Doppler: Transvalvular velocity was within the normal range. There was  no regurgitation.  ------------------------------------------------------------------- Tricuspid valve:  Doppler: There was no significant regurgitation.  ------------------------------------------------------------------- Pulmonary artery:  No complete TR doppler jet so unable to estimate PA systolic pressure.  ------------------------------------------------------------------- Right atrium: The atrium was normal in size.  ------------------------------------------------------------------- Pericardium: There was no pericardial effusion.  ------------------------------------------------------------------- Systemic veins: Inferior vena cava: The vessel was normal in size. The respirophasic diameter changes were in the normal range (= 50%), consistent with normal central venous pressure.  ------------------------------------------------------------------- Measurements  Left ventricle  Value      Reference LV ID, ED, PLAX chordal     (L)   39.2  mm    43 - 52 LV ID, ES, PLAX chordal     (L)   21.2  mm    23 - 38 LV fx shortening, PLAX chordal      46   %    >=29 LV PW thickness, ED           11.9  mm    --------- IVS/LV PW ratio, ED       (H)   1.34      <=1.3 Stroke volume, 2D            136  ml    --------- Stroke volume/bsa, 2D          76   ml/m^2  --------- LV ejection fraction, 1-p A4C      83   %    --------- LV end-diastolic volume, 2-p       44   ml    --------- LV end-systolic volume, 2-p       11   ml    --------- LV ejection fraction, 2-p        75   %    --------- Stroke volume, 2-p            33   ml    --------- LV end-diastolic volume/bsa, 2-p     25   ml/m^2  --------- LV end-systolic volume/bsa, 2-p     6   ml/m^2  --------- Stroke  volume/bsa, 2-p          18.5  ml/m^2  --------- LV e&', lateral              9.12  cm/s   --------- LV E/e&', lateral             24.15      --------- LV e&', medial              5.97  cm/s   --------- LV E/e&', medial             36.9      --------- LV e&', average              7.55  cm/s   --------- LV E/e&', average             29.2      ---------  Ventricular septum            Value      Reference IVS thickness, ED            15.9  mm    ---------  LVOT                   Value      Reference LVOT ID, S                20   mm    --------- LVOT area                3.14  cm^2   --------- LVOT peak velocity, S          172  cm/s   --------- LVOT mean velocity, S          119  cm/s   --------- LVOT VTI, S               43.4  cm    ---------  LVOT peak gradient, S          12   mm Hg  ---------  Aortic valve               Value      Reference Aortic valve peak velocity, S      519  cm/s   --------- Aortic valve mean velocity, S      361  cm/s   --------- Aortic valve VTI, S           119  cm    --------- Aortic mean gradient, S         58   mm Hg  --------- Aortic peak gradient, S         108  mm Hg  --------- VTI ratio, LVOT/AV            0.36      --------- Aortic valve area, VTI          1.15  cm^2   --------- Aortic valve area/bsa, VTI        0.65  cm^2/m^2 --------- Velocity ratio, peak, LVOT/AV      0.33      --------- Aortic valve area, peak velocity     1.04  cm^2   --------- Aortic valve area/bsa, peak       0.58  cm^2/m^2  --------- velocity Velocity ratio, mean, LVOT/AV      0.33      --------- Aortic valve area, mean velocity     1.04  cm^2   --------- Aortic valve area/bsa, mean       0.58  cm^2/m^2 --------- velocity  Aorta                  Value      Reference Aortic root ID, ED            27   mm    ---------  Left atrium               Value      Reference LA ID, A-P, ES              44   mm    --------- LA ID/bsa, A-P          (H)   2.47  cm/m^2  <=2.2 LA volume, S               90   ml    --------- LA volume/bsa, S             50.5  ml/m^2  --------- LA volume, ES, 1-p A4C          92   ml    --------- LA volume/bsa, ES, 1-p A4C        51.6  ml/m^2  --------- LA volume, ES, 1-p A2C          89   ml    --------- LA volume/bsa, ES, 1-p A2C        50   ml/m^2  ---------  Mitral valve               Value      Reference Mitral E-wave peak velocity       220.29 cm/s   --------- Mitral A-wave peak velocity       172  cm/s   --------- Mitral mean velocity, D         150  cm/s   --------- Mitral deceleration slope  758.33 cm/s^2  --------- Mitral deceleration time     (H)   290  ms    150 - 230 Mitral pressure half-time        84   ms    --------- Mitral mean gradient, D         10   mm Hg  --------- Mitral peak gradient, D         19   mm Hg  --------- Mitral E/A ratio, peak          1.28      --------- Mitral valve area, PHT, DP        2.61  cm^2   --------- Mitral valve area/bsa, PHT, DP      1.47  cm^2/m^2 --------- Mitral valve area/bsa, LVOT       1.32  cm^2/m^2 --------- continuity Mitral annulus VTI, D           58   cm    ---------  Right ventricle             Value      Reference RV s&', lateral, S            14.9  cm/s   ---------  Legend: (L) and (H) mark values outside specified reference range.  ------------------------------------------------------------------- Prepared and Electronically Authenticated by  Loralie Champagne, M.D. 2016-11-07T13:18:02  Larey Dresser, MD (Primary)      Procedures    Right/Left Heart Cath and Coronary Angiography    Conclusion    1. Mildly elevated left heart filling pressure.  2. 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  3. Nonobstructive mild CAD.   She will undergo evaluation for TAVR.  She will need to resume Lovenox injections tonight.   CKD: 30 cc contrast used. Will hydrate post-procedure.     Technique and Indications    Procedure: Right Heart Cath, Selective Coronary Angiography  Indication: Severe aortic stenosis.   Procedural Details: The right radial and brachial areas were prepped, draped, and anesthetized with 1% lidocaine. There was a pre-existing peripheral IV in the right brachial area that was replaced with a 37F venous sheath. A Swan-Ganz catheter was used for the right heart catheterization. Standard protocol was followed for recording of right heart pressures and sampling of oxygen saturations. Fick cardiac output was calculated. The right radial artery was entered using modified Seldinger technique and a 37F sheath was placed. The patient received weight-based IV heparin and 3 mg IA verapamil. Standard Judkins catheters were used for selective coronary angiography. There were no immediate procedural complications. The patient was transferred to the post catheterization recovery area for further monitoring.  Estimated blood loss <50 mL.    Coronary Findings    Dominance: Right   Left  Main  No significant disease.     Left Anterior Descending  Luminal irregularities in the LAD. 40% proximal D1 stenosis.     Left Circumflex  Luminal irregularities.     Right Coronary Artery  Luminal irregularities.       Right Heart Pressures [facility] Procedural Findings: Hemodynamics (mmHg) RA mean 4 RV 45/5 PA 43/15, mean 31 PCWP mean 18  Oxygen saturations: PA 65% AO 98%  Cardiac Output (Fick) 5.14  Cardiac Index (Fick) 3.02  PVR 2.5 WU    Wall Motion    Not done, CKD and known severe AS.           Implants    Name ID  Temporary Type Supply   No information to display    PACS Images    Show images for Cardiac catheterization     Link to Procedure Log    Procedure Log      Hemo Data       Most Recent Value   Fick Cardiac Output  5.14 L/min   Fick Cardiac Output Index  3.02 (L/min)/BSA   RA A Wave  7 mmHg   RA V Wave  4 mmHg   RA Mean  4 mmHg   RV Systolic Pressure  45 mmHg   RV Diastolic Pressure  -1 mmHg   RV EDP  5 mmHg   PA Systolic Pressure  43 mmHg   PA Diastolic Pressure  15 mmHg   PA Mean  31 mmHg   PW A Wave  22 mmHg   PW V Wave  17 mmHg   PW Mean  18 mmHg   AO Systolic Pressure  123456 mmHg   AO Diastolic Pressure  67 mmHg   AO Mean  89 mmHg   QP/QS  1   TPVR Index  10.25 HRUI   TSVR Index  29.43 HRUI   PVR SVR Ratio  0.15   TPVR/TSVR Ratio  0.35     STS Risk:  Risk of Mortality: 3.011%  Morbidity or Mortality: 20.054%  Long Length of Stay: 8.499%  Short Length of Stay: 29.44%  Permanent Stroke: 1.597%  Prolonged Ventilation: 13.128%  DSW Infection: 0.253%  Renal Failure: 4.569%  Reoperation: 8.209%    ADDENDUM REPORT: 09/28/2015 17:14 CLINICAL DATA: Aortic stenosis EXAM: Cardiac TAVR CT TECHNIQUE: The patient was scanned on a Philips  256 scanner. A 120 kV retrospective scan was triggered in the descending thoracic aorta at 111 HU's. Gantry rotation speed was 270 msecs and collimation was .9 mm. No beta blockade or nitro were given. The 3D data set was reconstructed in 5% intervals of the R-R cycle. Systolic and diastolic phases were analyzed on a dedicated work station using MPR, MIP and VRT modes. The patient received 80 cc of contrast. FINDINGS: Aortic Valve: Trileaflet and calcified. Small area of annular calcification at the base of the right leaflet. Calcification of the intervalvular fibrosa below the annulus at the base of the anterior mitral leaflet Severe LAE with dilated LAA. No thrombus Aorta: Normal sized root with normal origin of great vessels. Calcification of the STJ at the RCA ostium. Moderate calcification of the arch and descending thoracic aorta Sinotubular Junction: 25 mm Ascending Thoracic Aorta: 32 mm Aortic Arch: 26 mm Descending Thoracic Aorta: 24 mm Sinus of Valsalva Measurements: Non-coronary: 28.5 mm Right -coronary: 27 mm Left -coronary: 28 mm Coronary Artery Height above Annulus: Left Main: 16.3 mm above annulus Right Coronary: 14 mm above annulus Virtual Basal Annulus Measurements: Maximum/Minimum Diameter: 24.9 mm x 21.3 mm Perimeter: 76 mm Area: 442 mm2 Coronary Arteries: Suitable height above annulus for delivery Optimum Fluoroscopic Angle for Delivery: LAO 27 degrees Cranial 1 degree IMPRESSION: 1) Calcified trileaflet aortic valve with annulus 442 mm2 suitable for a 26 mm Sapien 3 valve 2) Suitable coronary height for deployment 3) Optimum angle for deployment LAO 27 degrees Cranial 1 degree 4) No LAA thrombus severely dilated LA/LAA 5) Calcification of the descending thoracic aorta and arch no aneurysm normal sized aortic root Jenkins Rouge Electronically Signed  By: Jenkins Rouge M.D.  On: 09/28/2015 17:14     Study Result      EXAM: OVER-READ INTERPRETATION CT CHEST  The  following report is an over-read performed by radiologist Dr. Rebekah Chesterfield [facility] on 09/28/2015. This over-read does not include interpretation of cardiac or coronary anatomy or pathology. The coronary calcium score/coronary CTA interpretation by the cardiologist is attached.  COMPARISON: No priors.  FINDINGS: 7 x 5 mm (mean diameter 6 mm) left upper lobe nodule (image 80 of series 412). Mild diffuse bronchial wall thickening with mild centrilobular and paraseptal emphysema. Within the visualized portions of the thorax there is no acute consolidative airspace disease, no pleural effusion and no pneumothorax. Multiple enlarged and borderline enlarged mediastinal and right hilar lymph nodes are noted, measuring up to 11 mm in the right hilar region, 11 mm in the prevascular nodal station, 12 mm in the subcarinal nodal station and 11 mm in the low right paratracheal nodal station. Sub cm low-attenuation lesions in segment 4A of the liver are too small to definitively characterize, but favored to represent small cysts. Small hiatal hernia. There are no aggressive appearing lytic or blastic lesions noted in the visualized portions of the skeleton.  IMPRESSION: 1. Mediastinal and right hilar lymphadenopathy. This is of uncertain etiology and significance, but clinical correlation for signs and symptoms of lymphoproliferative disorder is suggested. 2. In addition, there is a nodule in the left upper lobe (image 80 of series 412) which has a mean diameter of 6 mm. Given the smoking related changes in the lungs, the patient is at high risk for bronchogenic carcinoma, and accordingly a follow-up chest CT at 6-12 months is recommended. This recommendation follows the consensus statement: Guidelines for Management of Small Pulmonary Nodules Detected on CT Scans: A Statement from the [place] as published in  Radiology 2005;237:395-400. 3. Additional incidental findings, as above.  Electronically Signed: By: Vinnie Langton M.D. On: 09/28/2015 11:01    CLINICAL DATA: 70 year old female with history of severe aortic stenosis. Preprocedural study prior to potential transcatheter aortic valve replacement (TAVR).  EXAM: CT ANGIOGRAPHY CHEST, ABDOMEN AND PELVIS  TECHNIQUE: Multidetector CT imaging through the chest, abdomen and pelvis was performed using the standard protocol during bolus administration of intravenous contrast. Multiplanar reconstructed images and MIPs were obtained and reviewed to evaluate the vascular anatomy.  CONTRAST: 37mL OMNIPAQUE IOHEXOL 350 MG/ML SOLN  COMPARISON: Cardiac CTA 09/28/2015. Chest CT 05/27/2005.  FINDINGS: CTA CHEST FINDINGS  Mediastinum/Lymph Nodes: Heart size is mildly enlarged. There is no significant pericardial fluid, thickening or pericardial calcification. There is atherosclerosis of the thoracic aorta, the great vessels of the mediastinum and the coronary arteries, including calcified atherosclerotic plaque in the left main, left anterior descending, left circumflex and right coronary arteries. Severe thickening calcification of the aortic valve, mitral valve and mitral annulus. Multiple borderline enlarged and mildly enlarged mediastinal and bilateral hilar lymph nodes, measuring up to 11 mm in short axis in the AP window nodal station. Esophagus is unremarkable in appearance. No axillary lymphadenopathy.  Lungs/Pleura: 7 x 5 mm nodule in the left upper lobe again noted (image 33 of series 8), which is only slightly larger than remote prior study 05/27/2005, strongly favored to be benign. 10 x 9 mm pleural-based nodule in the medial aspect of the apex of the right upper lobe (image 16 of series 8), new compared to prior study 05/27/2005. 7 mm subpleural nodule in the periphery of the lateral segment of the right middle  lobe (image 100 of series 8), unchanged compared to 05/27/2005, considered benign, presumably a subpleural lymph node. Small thick-walled cavitary area measuring approximately 1.4 x 0.8  cm in the periphery of the right lower lobe (image 118 of series 8) immediately above the right hemidiaphragm, with a small amount of surrounding ground-glass attenuation and septal thickening. Small calcified granuloma in the right lower lobe. Mild diffuse ground-glass attenuation and septal thickening in the lungs bilaterally, suggesting a background of mild interstitial pulmonary edema. Mild diffuse bronchial wall thickening with mild centrilobular and paraseptal emphysema.  Musculoskeletal/Soft Tissues: There are no aggressive appearing lytic or blastic lesions noted in the visualized portions of the skeleton.  CTA ABDOMEN AND PELVIS FINDINGS  Hepatobiliary: A few scattered low-attenuation lesions are noted in the liver, compatible with simple cysts, the largest of which measures 11 mm in segment 4 day. No other suspicious hepatic lesions are noted. No intra or extrahepatic biliary ductal dilatation. Gallbladder is presumably surgically absent (no surgical clips are noted in the gallbladder fossa).  Pancreas: No pancreatic mass. No pancreatic ductal dilatation. No pancreatic or peripancreatic fluid or inflammatory changes.  Spleen: Unremarkable.  Adrenals/Urinary Tract: Severe atrophy of the right kidney. Multifocal cortical thinning in the kidneys bilaterally, presumably areas of chronic post infectious or inflammatory scarring. Multiple low-attenuation lesions in the kidneys bilaterally, largest of which are compatible with simple cysts, which measure up to 1.5 cm in diameter in the lateral aspect of the interpolar region of the right kidney. Multiple other smaller sub cm low-attenuation lesions in the kidneys bilaterally are too small to definitively characterize. Mild irregularity  of the adrenal glands, without definite dominant nodule. No hydroureteronephrosis. Urinary bladder is normal in appearance.  Stomach/Bowel: Normal appearance of the stomach. No pathologic dilatation of small bowel or colon. Appendix is not identified, likely surgically absent. Regardless, there are no inflammatory changes adjacent to the cecum to suggest presence of an acute appendicitis at this time.  Vascular/Lymphatic: Extensive atherosclerosis throughout the abdominal and pelvic vasculature, with vascular findings and measurements pertinent to potential TAVR procedure, as detailed below. No aneurysm or dissection identified. No lymphadenopathy noted in the abdomen or pelvis.  Reproductive: Status post hysterectomy. Ovaries are not confidently identified may be surgically absent or atrophic.  Other: No significant volume of ascites. No pneumoperitoneum.  Musculoskeletal: Multiple small well-defined sclerotic lesions with narrow zones of transition are noted throughout the visualized axial and appendicular skeleton, predominantly in the bony pelvis and lower lumbar spine, favored to represent bone islands. There are no other aggressive appearing lytic or blastic lesions noted in the visualized portions of the skeleton.  VASCULAR MEASUREMENTS PERTINENT TO TAVR:  AORTA:  Minimal Aortic Diameter - 11 x 11 mm  Severity of Aortic Calcification - severe  RIGHT PELVIS:  Right Common Iliac Artery -  Minimal Diameter - 6.1 x 6.7 mm  Tortuosity - mild to moderate  Calcification - moderate to severe  Right External Iliac Artery -  Minimal Diameter - 6.7 x 6.4 mm  Tortuosity - mild  Calcification - mild  Right Common Femoral Artery -  Minimal Diameter - 5.7 x 7.0 mm  Tortuosity - mild  Calcification - mild to moderate  LEFT PELVIS:  Left Common Iliac Artery -  Minimal Diameter - 5.8 x 8.0 mm  Tortuosity -  moderate  Calcification - moderate to severe  Left External Iliac Artery -  Minimal Diameter - 7.1 x 7.6 mm  Tortuosity - mild  Calcification - mild  Left Common Femoral Artery -  Minimal Diameter - 7.0 x 5.4 mm  Tortuosity - mild  Calcification - moderate  Review of the MIP images confirms the  above findings.  IMPRESSION: 1. Vascular findings and measurements pertinent to potential TAVR procedure, as detailed above. This patient does appear to have suitable pelvic arterial access bilaterally. 2. Severe thickening calcification of the aortic valve, compatible with the patient's reported clinical history of severe aortic stenosis. There is also severe thickening calcification of the mitral valve and mitral annulus. 3. Multiple pulmonary nodules, largest of which is a pleural-based 1.0 x 0.9 cm nodule in the medial aspect of the apex of the right upper lobe. This could represent an area of post infectious or inflammatory scarring, however, neoplasm is not excluded. In addition, there is a thick-walled cavitary area in the periphery of the right lower lobe measuring 1.4 x 0.8 cm (image 118 of series 8), which is favored to be post infectious/inflammatory. Attention on repeat chest CT is suggested in the next 2-3 months to ensure the stability or resolution of these findings. 4. In addition, there is mediastinal and bilateral hilar lymphadenopathy, similar to the recent prior examination. Clinical correlation for signs and symptoms of lymphoproliferative disorder is recommended. However, given the evidence for mild interstitial pulmonary edema in the lungs, the low-level lymphadenopathy may simply be related to underlying congestive heart failure. 5. Mild cardiomegaly. 6. Atherosclerosis, including left main and 3 vessel coronary artery disease. Assessment for potential risk factor modification, dietary therapy or pharmacologic therapy may be warranted, if  clinically indicated. 7. Mild diffuse bronchial wall thickening with mild centrilobular and paraseptal emphysema. 8. Additional incidental findings, as above.   Electronically Signed  By: Vinnie Langton M.D.  On: 10/12/2015 17:42  Impression:  I have personally reviewed her echocardiogram and cath films. She has stage D severe, symptomatic aortic stenosis with recent exacerbation of congestive heart failure with pulmonary edema and worsening shortness of breath. She has baseline chronic shortness of breath with mild exertion due to COPD. I think aortic valve replacement is indicated to prevent progressive symptoms and recurrent exacerbations of congestive heart failure. I have discussed the natural progression of aortic stenosis with her and her family and the poor prognosis over the next couple years without valve replacement. I think she would be a poor candidate for open surgical AVR due to the combination of her age, COPD on home oxygen, rheumatoid arthritis with limited mobility, chronic kidney disease, history of stroke and hypercoagulable syndrome on chronic coumadin. I think TAVR would be a reasonable alternative. Cardiac gated CT angiogram of the heart demonstrates the presence of aortic stenosis with anatomical characteristics suitable for transcatheter aortic valve replacement without any complicating features. CT angiogram of the abdomen and pelvis demonstrates moderate atherosclerotic disease with what appears to be adequate pelvic vascular access to facilitate a transfemoral approach.  The patient and her husband were counseled at length regarding treatment alternatives for management of severe symptomatic aortic stenosis. Alternative approaches such as conventional aortic valve replacement, transcatheter aortic valve replacement, and palliative medical therapy were compared and contrasted at length. The risks associated with conventional surgical aortic valve replacement were  been discussed in detail, as were expectations for post-operative convalescence. Long-term prognosis with medical therapy was discussed. This discussion was placed in the context of the patient's own specific clinical presentation and past medical history. All of their questions been addressed. The patient is to proceed with transcatheter aortic valve replacement in the near future.  Following the decision to proceed with transcatheter aortic valve replacement, a discussion has been held regarding what types of management strategies would be attempted intraoperatively in the event of  life-threatening complications, including whether or not the patient would be considered a candidate for the use of cardiopulmonary bypass and/or conversion to open sternotomy for attempted surgical intervention. The patient has been advised of a variety of complications that might develop including but not limited to risks of death, stroke, paravalvular leak, aortic dissection or other major vascular complications, aortic annulus rupture, device embolization, cardiac rupture or perforation, mitral regurgitation, acute myocardial infarction, arrhythmia, heart block or bradycardia requiring permanent pacemaker placement, congestive heart failure, respiratory failure, renal failure, pneumonia, infection, other late complications related to structural valve deterioration or migration, or other complications that might ultimately cause a temporary or permanent loss of functional independence or other long term morbidity. The patient provides full informed consent for the procedure as described and all questions were answered.   Plan:  Transfemoral TAVR on 10/29/2014.    Gaye Pollack, MD

## 2015-10-29 NOTE — Progress Notes (Addendum)
Anesthesia Chart Review: Patient is a 70 year old female scheduled for TAVR on 10/30/15 with Dr. Angelena Form and Dr. Cyndia Bent.   Per Dr. Angelena Form, patient needs to check in at 6:30am for IV hydration (75cc/hr).  History includes severe AS, CAD (40% D1), chronic diastolic CHF, former smoker, HLD, hypothyroidism, RA, Von Willebrand disease, CVA (thalamic '10), antiphospholipid syndrome (on chronic warfarin), iron deficiency anemia, COPD on home O2 (2-3L), depression, SOB, CKD. PCP is listed as Roe Coombs, PA-C.  Pulmonologist is Dr. Chase Caller. Hematologist is Dr. Jana Hakim. Primary cardiologist is Dr. Aundra Dubin.  Meds include amlodipine, aspirin 81 mg, Lipitor, Lovenox (start 10/26/15), Uloric, Lasix, Synthroid, losartan, Toprol-XL, Omega 3, K-Dur, Spiriva, tramadol, warfarin (on hold).  10/12/15 PFTs: FVC 1.72 (66%), FEV1 1.03 (52%), DLCOunc 11.58 (57%).  Preoperative CXR, coronary CT, CTA chest/abd/pelvis, and EKG noted.   07/30/15 Echo: Impressions: - Normal LV size with mild LV hypertrophy. EF 60-65%. Moderate diastolic dysfunction. Normal RV size and systolic function. Severe aortic stenosis by mean gradient, AVA 1.1 cm^2 by VTI, 0.9 cm^2 by Vmax. I think that the AS is severe. The mitral valve and annulus are heavily calcified. Mean gradient across the valve is 10 mmHg but MVA by PHT is 2.6 cm^2. I do not think that the mitral stenosis is more than mild.  08/09/15 LHC: 1. Mildly elevated left heart filling pressure.  2. 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  3. Nonobstructive mild CAD.   Preoperative labs noted. PO2 on ABG 6.01, CO2 39.2. BUN/Cr 41/2.47 (previous range 19/1.75 - 27/2.37 since 07/2016. H/H 8.1/27.2. (previously 9.8/31.5 on 08/02/15). PT/INR 23.6/2.13, PTT 62. Glucose 115.   I have been communicating about labs results with [facility]CTS RN Thurmond Butts who has reviewed results with Dr. Cyndia Bent. As previously planned, she  will come in at 6:30AM tomorrow for IV hydration and they will consider post-operative nephrology consult as needed. In regards to her anemia and history of Von Willebrand's disease, he had no additional preoperative recommendations; however anesthesiologist Dr. Veatrice Kells did recommend further input regarding reported Von Willebrand's disease. I reviewed Dr. Virgie Dad 02/12/15 and 02/08/13 notes. Von Willebrand's dose populate into her history, but assessment specifically states, "...history of antiphospholipid syndrome with a weakly positive lupus anticoagulant, a persistent elevation of the PTT and elevated anticardiolipin antibodies, with a history of thalamic infarct October 2007, on lifelong Coumadin..." I did call and speak with patient. She did report that she was diagnosed with Von Willebrand's in 2000 at the time of her CVA work-up. She does not remember a specific classification/type and does not recall ever having to do anything in particular before surgery like taking DDAVP--just remembers having to hold warfarin and start Lovenox bridge.    I called [facility] to speak with Dr. Jana Hakim and was given his pager number [phone]. I have given this to [place]. She will review with Dr. Angelena Form. She will contact me or Alpha Gula, FNP-BC with an update. If he has note spoken with Dr. Jana Hakim then we will attempt to reach him this afternoon for any recommendations.  [facility] Short Stay Center/Anesthesiology Phone [phone] 10/29/2015 2:01 PM

## 2015-10-30 ENCOUNTER — Inpatient Hospital Stay (HOSPITAL_COMMUNITY): Payer: Medicare HMO | Admitting: Certified Registered"

## 2015-10-30 ENCOUNTER — Encounter (HOSPITAL_COMMUNITY): Payer: Self-pay | Admitting: Certified Registered"

## 2015-10-30 ENCOUNTER — Inpatient Hospital Stay (HOSPITAL_COMMUNITY): Payer: Medicare HMO

## 2015-10-30 ENCOUNTER — Encounter (HOSPITAL_COMMUNITY)
Admission: RE | Disposition: A | Discharge status: Home Health | Payer: Self-pay | Source: Ambulatory Visit | Attending: Cardiovascular Disease

## 2015-10-30 ENCOUNTER — Inpatient Hospital Stay (HOSPITAL_COMMUNITY)
Admission: RE | Admit: 2015-10-30 | Discharge: 2015-11-02 | DRG: 267 | Disposition: A | Discharge status: Home Health | Payer: Medicare HMO | Source: Ambulatory Visit | Attending: Cardiovascular Disease | Admitting: Cardiovascular Disease

## 2015-10-30 ENCOUNTER — Inpatient Hospital Stay (HOSPITAL_COMMUNITY): Payer: Medicare HMO | Admitting: Vascular Surgery

## 2015-10-30 DIAGNOSIS — D6861 Antiphospholipid syndrome: Secondary | ICD-10-CM | POA: Diagnosis present

## 2015-10-30 DIAGNOSIS — Z882 Allergy status to sulfonamides status: Secondary | ICD-10-CM

## 2015-10-30 DIAGNOSIS — Z87891 Personal history of nicotine dependence: Secondary | ICD-10-CM | POA: Diagnosis not present

## 2015-10-30 DIAGNOSIS — I2510 Atherosclerotic heart disease of native coronary artery without angina pectoris: Secondary | ICD-10-CM | POA: Diagnosis present

## 2015-10-30 DIAGNOSIS — Z9981 Dependence on supplemental oxygen: Secondary | ICD-10-CM | POA: Diagnosis not present

## 2015-10-30 DIAGNOSIS — Z90710 Acquired absence of both cervix and uterus: Secondary | ICD-10-CM | POA: Diagnosis not present

## 2015-10-30 DIAGNOSIS — I350 Nonrheumatic aortic (valve) stenosis: Principal | ICD-10-CM | POA: Diagnosis present

## 2015-10-30 DIAGNOSIS — I130 Hypertensive heart and chronic kidney disease with heart failure and stage 1 through stage 4 chronic kidney disease, or unspecified chronic kidney disease: Secondary | ICD-10-CM | POA: Diagnosis present

## 2015-10-30 DIAGNOSIS — J449 Chronic obstructive pulmonary disease, unspecified: Secondary | ICD-10-CM | POA: Diagnosis present

## 2015-10-30 DIAGNOSIS — N183 Chronic kidney disease, stage 3 (moderate): Secondary | ICD-10-CM | POA: Diagnosis present

## 2015-10-30 DIAGNOSIS — D6859 Other primary thrombophilia: Secondary | ICD-10-CM | POA: Diagnosis not present

## 2015-10-30 DIAGNOSIS — Z8249 Family history of ischemic heart disease and other diseases of the circulatory system: Secondary | ICD-10-CM | POA: Diagnosis not present

## 2015-10-30 DIAGNOSIS — R0600 Dyspnea, unspecified: Secondary | ICD-10-CM | POA: Diagnosis not present

## 2015-10-30 DIAGNOSIS — Z9109 Other allergy status, other than to drugs and biological substances: Secondary | ICD-10-CM | POA: Diagnosis not present

## 2015-10-30 DIAGNOSIS — I5022 Chronic systolic (congestive) heart failure: Secondary | ICD-10-CM | POA: Diagnosis present

## 2015-10-30 DIAGNOSIS — Z79899 Other long term (current) drug therapy: Secondary | ICD-10-CM

## 2015-10-30 DIAGNOSIS — I10 Essential (primary) hypertension: Secondary | ICD-10-CM | POA: Diagnosis present

## 2015-10-30 DIAGNOSIS — Z006 Encounter for examination for normal comparison and control in clinical research program: Secondary | ICD-10-CM

## 2015-10-30 DIAGNOSIS — D509 Iron deficiency anemia, unspecified: Secondary | ICD-10-CM | POA: Diagnosis present

## 2015-10-30 DIAGNOSIS — D680 Von Willebrand's disease: Secondary | ICD-10-CM | POA: Diagnosis present

## 2015-10-30 DIAGNOSIS — R000 Tachycardia, unspecified: Secondary | ICD-10-CM | POA: Diagnosis not present

## 2015-10-30 DIAGNOSIS — D649 Anemia, unspecified: Secondary | ICD-10-CM | POA: Diagnosis not present

## 2015-10-30 DIAGNOSIS — Z8673 Personal history of transient ischemic attack (TIA), and cerebral infarction without residual deficits: Secondary | ICD-10-CM | POA: Diagnosis not present

## 2015-10-30 DIAGNOSIS — E785 Hyperlipidemia, unspecified: Secondary | ICD-10-CM | POA: Diagnosis present

## 2015-10-30 DIAGNOSIS — I272 Other secondary pulmonary hypertension: Secondary | ICD-10-CM | POA: Diagnosis present

## 2015-10-30 DIAGNOSIS — Z952 Presence of prosthetic heart valve: Secondary | ICD-10-CM

## 2015-10-30 DIAGNOSIS — Z7901 Long term (current) use of anticoagulants: Secondary | ICD-10-CM

## 2015-10-30 DIAGNOSIS — N189 Chronic kidney disease, unspecified: Secondary | ICD-10-CM | POA: Diagnosis present

## 2015-10-30 DIAGNOSIS — Z7982 Long term (current) use of aspirin: Secondary | ICD-10-CM | POA: Diagnosis not present

## 2015-10-30 HISTORY — PX: TEE WITHOUT CARDIOVERSION: SHX5443

## 2015-10-30 HISTORY — PX: TRANSCATHETER AORTIC VALVE REPLACEMENT, TRANSFEMORAL: SHX6400

## 2015-10-30 LAB — POCT I-STAT, CHEM 8
BUN: 32 mg/dL — AB (ref 6–20)
BUN: 31 mg/dL — AB (ref 6–20)
BUN: 30 mg/dL — AB (ref 6–20)
CALCIUM ION: 1.41 mmol/L — AB (ref 1.13–1.30)
CHLORIDE: 107 mmol/L (ref 101–111)
CHLORIDE: 106 mmol/L (ref 101–111)
CREATININE: 1.8 mg/dL — AB (ref 0.44–1.00)
CREATININE: 1.7 mg/dL — AB (ref 0.44–1.00)
Calcium, Ion: 1.44 mmol/L — ABNORMAL HIGH (ref 1.13–1.30)
Calcium, Ion: 1.43 mmol/L — ABNORMAL HIGH (ref 1.13–1.30)
Chloride: 106 mmol/L (ref 101–111)
Creatinine, Ser: 1.7 mg/dL — ABNORMAL HIGH (ref 0.44–1.00)
GLUCOSE: 105 mg/dL — AB (ref 65–99)
Glucose, Bld: 99 mg/dL (ref 65–99)
Glucose, Bld: 111 mg/dL — ABNORMAL HIGH (ref 65–99)
HCT: 23 % — ABNORMAL LOW (ref 36.0–46.0)
HCT: 19 % — ABNORMAL LOW (ref 36.0–46.0)
HEMATOCRIT: 20 % — AB (ref 36.0–46.0)
Hemoglobin: 7.8 g/dL — ABNORMAL LOW (ref 12.0–15.0)
Hemoglobin: 6.5 g/dL — CL (ref 12.0–15.0)
Hemoglobin: 6.8 g/dL — CL (ref 12.0–15.0)
POTASSIUM: 3.8 mmol/L (ref 3.5–5.1)
Potassium: 4.1 mmol/L (ref 3.5–5.1)
Potassium: 4.4 mmol/L (ref 3.5–5.1)
SODIUM: 139 mmol/L (ref 135–145)
Sodium: 139 mmol/L (ref 135–145)
Sodium: 138 mmol/L (ref 135–145)
TCO2: 27 mmol/L (ref 0–100)
TCO2: 27 mmol/L (ref 0–100)
TCO2: 26 mmol/L (ref 0–100)

## 2015-10-30 LAB — PROTIME-INR
INR: 1.34 (ref 0.00–1.49)
INR: 1.49 (ref 0.00–1.49)
PROTHROMBIN TIME: 18.1 s — AB (ref 11.6–15.2)
Prothrombin Time: 16.7 seconds — ABNORMAL HIGH (ref 11.6–15.2)

## 2015-10-30 LAB — CBC
HCT: 21.7 % — ABNORMAL LOW (ref 36.0–46.0)
HEMOGLOBIN: 6.3 g/dL — AB (ref 12.0–15.0)
MCH: 22.4 pg — AB (ref 26.0–34.0)
MCHC: 29 g/dL — AB (ref 30.0–36.0)
MCV: 77.2 fL — ABNORMAL LOW (ref 78.0–100.0)
PLATELETS: 248 10*3/uL (ref 150–400)
RBC: 2.81 MIL/uL — ABNORMAL LOW (ref 3.87–5.11)
RDW: 16.6 % — ABNORMAL HIGH (ref 11.5–15.5)
WBC: 9.6 10*3/uL (ref 4.0–10.5)

## 2015-10-30 LAB — POCT I-STAT 3, ART BLOOD GAS (G3+)
ACID-BASE DEFICIT: 1 mmol/L (ref 0.0–2.0)
ACID-BASE DEFICIT: 1 mmol/L (ref 0.0–2.0)
ACID-BASE DEFICIT: 2 mmol/L (ref 0.0–2.0)
Acid-base deficit: 2 mmol/L (ref 0.0–2.0)
BICARBONATE: 25.9 meq/L — AB (ref 20.0–24.0)
BICARBONATE: 25.2 meq/L — AB (ref 20.0–24.0)
BICARBONATE: 23.1 meq/L (ref 20.0–24.0)
BICARBONATE: 23.2 meq/L (ref 20.0–24.0)
O2 SAT: 100 %
O2 SAT: 99 %
O2 SAT: 97 %
O2 Saturation: 99 %
PCO2 ART: 38 mmHg (ref 35.0–45.0)
PO2 ART: 362 mmHg — AB (ref 80.0–100.0)
PO2 ART: 138 mmHg — AB (ref 80.0–100.0)
PO2 ART: 131 mmHg — AB (ref 80.0–100.0)
PO2 ART: 95 mmHg (ref 80.0–100.0)
Patient temperature: 36.8
Patient temperature: 36.6
Patient temperature: 36.8
TCO2: 28 mmol/L (ref 0–100)
TCO2: 27 mmol/L (ref 0–100)
TCO2: 24 mmol/L (ref 0–100)
TCO2: 24 mmol/L (ref 0–100)
pCO2 arterial: 59 mmHg (ref 35.0–45.0)
pCO2 arterial: 46.1 mmHg — ABNORMAL HIGH (ref 35.0–45.0)
pCO2 arterial: 42.6 mmHg (ref 35.0–45.0)
pH, Arterial: 7.251 — ABNORMAL LOW (ref 7.350–7.450)
pH, Arterial: 7.345 — ABNORMAL LOW (ref 7.350–7.450)
pH, Arterial: 7.39 (ref 7.350–7.450)
pH, Arterial: 7.343 — ABNORMAL LOW (ref 7.350–7.450)

## 2015-10-30 LAB — POCT I-STAT 4, (NA,K, GLUC, HGB,HCT)
Glucose, Bld: 104 mg/dL — ABNORMAL HIGH (ref 65–99)
HCT: 40 % (ref 36.0–46.0)
HEMOGLOBIN: 13.6 g/dL (ref 12.0–15.0)
POTASSIUM: 4.3 mmol/L (ref 3.5–5.1)
Sodium: 136 mmol/L (ref 135–145)

## 2015-10-30 LAB — APTT
aPTT: 48 seconds — ABNORMAL HIGH (ref 24–37)
aPTT: 43 seconds — ABNORMAL HIGH (ref 24–37)

## 2015-10-30 LAB — GLUCOSE, CAPILLARY
Glucose-Capillary: 108 mg/dL — ABNORMAL HIGH (ref 65–99)
Glucose-Capillary: 94 mg/dL (ref 65–99)

## 2015-10-30 LAB — PREPARE RBC (CROSSMATCH)

## 2015-10-30 LAB — HEMOGLOBIN AND HEMATOCRIT, BLOOD
HEMATOCRIT: 30.9 % — AB (ref 36.0–46.0)
HEMOGLOBIN: 9.4 g/dL — AB (ref 12.0–15.0)

## 2015-10-30 SURGERY — IMPLANTATION, AORTIC VALVE, TRANSCATHETER, FEMORAL APPROACH
Anesthesia: General | Site: Chest

## 2015-10-30 MED ORDER — FENTANYL CITRATE (PF) 100 MCG/2ML IJ SOLN
INTRAMUSCULAR | Status: AC
  Administered 2015-10-30: 100 ug
  Filled 2015-10-30: qty 2

## 2015-10-30 MED ORDER — ROCURONIUM BROMIDE 100 MG/10ML IV SOLN
INTRAVENOUS | Status: DC | PRN
  Administered 2015-10-30: 50 mg via INTRAVENOUS
  Administered 2015-10-30: 20 mg via INTRAVENOUS

## 2015-10-30 MED ORDER — ACETAMINOPHEN 650 MG RE SUPP
650.0000 mg | Freq: Once | RECTAL | Status: AC
  Administered 2015-10-30: 650 mg via RECTAL

## 2015-10-30 MED ORDER — HYDRALAZINE HCL 20 MG/ML IJ SOLN
10.0000 mg | INTRAMUSCULAR | Status: DC | PRN
  Administered 2015-10-30 – 2015-10-31 (×2): 10 mg via INTRAVENOUS
  Filled 2015-10-30: qty 1

## 2015-10-30 MED ORDER — ACETAMINOPHEN 160 MG/5ML PO SOLN
650.0000 mg | Freq: Once | ORAL | Status: AC
Start: 2015-10-30 — End: 2015-10-30

## 2015-10-30 MED ORDER — POTASSIUM CHLORIDE 10 MEQ/50ML IV SOLN: 10.0000 meq | INTRAVENOUS | Status: AC

## 2015-10-30 MED ORDER — SODIUM CHLORIDE 0.9 % IV SOLN
Freq: Once | INTRAVENOUS | Status: AC
  Administered 2015-10-30: 10 mL via INTRAVENOUS

## 2015-10-30 MED ORDER — PHENYLEPHRINE HCL 10 MG/ML IJ SOLN
0.0000 ug/min | INTRAVENOUS | Status: DC
  Filled 2015-10-30: qty 2

## 2015-10-30 MED ORDER — PANTOPRAZOLE SODIUM 40 MG PO TBEC
40.0000 mg | DELAYED_RELEASE_TABLET | Freq: Every day | ORAL | Status: DC
  Filled 2015-10-30: qty 1

## 2015-10-30 MED ORDER — MIDAZOLAM HCL 2 MG/2ML IJ SOLN: 2.0000 mg | INTRAMUSCULAR | Status: DC | PRN

## 2015-10-30 MED ORDER — FENTANYL CITRATE (PF) 250 MCG/5ML IJ SOLN
INTRAMUSCULAR | Status: AC
  Filled 2015-10-30: qty 5

## 2015-10-30 MED ORDER — VANCOMYCIN HCL IN DEXTROSE 1-5 GM/200ML-% IV SOLN
1000.0000 mg | Freq: Once | INTRAVENOUS | Status: AC
  Administered 2015-10-30: 1000 mg via INTRAVENOUS
  Filled 2015-10-30: qty 200

## 2015-10-30 MED ORDER — DEXMEDETOMIDINE HCL IN NACL 200 MCG/50ML IV SOLN: 0.1000 ug/kg/h | INTRAVENOUS | Status: DC

## 2015-10-30 MED ORDER — ALBUMIN HUMAN 5 % IV SOLN: 250.0000 mL | INTRAVENOUS | Status: DC | PRN

## 2015-10-30 MED ORDER — PROPOFOL 10 MG/ML IV BOLUS
INTRAVENOUS | Status: AC
  Filled 2015-10-30: qty 20

## 2015-10-30 MED ORDER — ROCURONIUM BROMIDE 50 MG/5ML IV SOLN
INTRAVENOUS | Status: AC
  Filled 2015-10-30: qty 1

## 2015-10-30 MED ORDER — CETYLPYRIDINIUM CHLORIDE 0.05 % MT LIQD
7.0000 mL | Freq: Two times a day (BID) | OROMUCOSAL | Status: DC
  Administered 2015-10-30 – 2015-10-31 (×2): 7 mL via OROMUCOSAL

## 2015-10-30 MED ORDER — SUCCINYLCHOLINE CHLORIDE 20 MG/ML IJ SOLN
INTRAMUSCULAR | Status: AC
  Filled 2015-10-30: qty 1

## 2015-10-30 MED ORDER — MUPIROCIN 2 % EX OINT
1.0000 "application " | TOPICAL_OINTMENT | Freq: Two times a day (BID) | CUTANEOUS | Status: DC
  Administered 2015-10-31 – 2015-11-01 (×3): 1 via NASAL
  Filled 2015-10-30 (×3): qty 22

## 2015-10-30 MED ORDER — PROPOFOL 10 MG/ML IV BOLUS
INTRAVENOUS | Status: DC | PRN
  Administered 2015-10-30: 110 mg via INTRAVENOUS

## 2015-10-30 MED ORDER — ONDANSETRON HCL 4 MG/2ML IJ SOLN
4.0000 mg | Freq: Four times a day (QID) | INTRAMUSCULAR | Status: DC | PRN
  Administered 2015-10-30: 4 mg via INTRAVENOUS
  Filled 2015-10-30: qty 2

## 2015-10-30 MED ORDER — FENTANYL CITRATE (PF) 100 MCG/2ML IJ SOLN
INTRAMUSCULAR | Status: DC | PRN
  Administered 2015-10-30 (×2): 100 ug via INTRAVENOUS

## 2015-10-30 MED ORDER — ASPIRIN EC 81 MG PO TBEC
81.0000 mg | DELAYED_RELEASE_TABLET | Freq: Every day | ORAL | Status: DC
  Administered 2015-10-31 – 2015-11-01 (×2): 81 mg via ORAL
  Filled 2015-10-30 (×3): qty 1

## 2015-10-30 MED ORDER — INSULIN ASPART 100 UNIT/ML ~~LOC~~ SOLN: 0.0000 [IU] | SUBCUTANEOUS | Status: DC

## 2015-10-30 MED ORDER — 0.9 % SODIUM CHLORIDE (POUR BTL) OPTIME
TOPICAL | Status: DC | PRN
  Administered 2015-10-30: 3000 mL

## 2015-10-30 MED ORDER — SODIUM CHLORIDE 0.9 % IV SOLN
INTRAVENOUS | Status: DC | PRN
  Administered 2015-10-30 (×3): 500 mL

## 2015-10-30 MED ORDER — SODIUM CHLORIDE 0.9 % IJ SOLN
INTRAMUSCULAR | Status: AC
  Filled 2015-10-30: qty 10

## 2015-10-30 MED ORDER — SODIUM CHLORIDE 0.9 % IV SOLN
INTRAVENOUS | Status: DC
  Administered 2015-10-30: 50 mL via INTRAVENOUS

## 2015-10-30 MED ORDER — MAGNESIUM SULFATE 4 GM/100ML IV SOLN
4.0000 g | Freq: Once | INTRAVENOUS | Status: AC
  Administered 2015-10-30: 4 g via INTRAVENOUS
  Filled 2015-10-30: qty 100

## 2015-10-30 MED ORDER — EPINEPHRINE HCL 0.1 MG/ML IJ SOSY
PREFILLED_SYRINGE | INTRAMUSCULAR | Status: AC
  Filled 2015-10-30: qty 10

## 2015-10-30 MED ORDER — ACETAMINOPHEN 160 MG/5ML PO SOLN: 1000.0000 mg | Freq: Four times a day (QID) | ORAL | Status: DC

## 2015-10-30 MED ORDER — FAMOTIDINE IN NACL 20-0.9 MG/50ML-% IV SOLN
20.0000 mg | Freq: Two times a day (BID) | INTRAVENOUS | Status: DC
  Administered 2015-10-30: 20 mg via INTRAVENOUS
  Filled 2015-10-30: qty 50

## 2015-10-30 MED ORDER — CHLORHEXIDINE GLUCONATE 0.12 % MT SOLN
15.0000 mL | OROMUCOSAL | Status: AC
  Administered 2015-10-30: 15 mL via OROMUCOSAL

## 2015-10-30 MED ORDER — LACTATED RINGERS IV SOLN: 500.0000 mL | Freq: Once | INTRAVENOUS | Status: DC | PRN

## 2015-10-30 MED ORDER — NITROGLYCERIN IN D5W 200-5 MCG/ML-% IV SOLN: 0.0000 ug/min | INTRAVENOUS | Status: DC

## 2015-10-30 MED ORDER — SODIUM CHLORIDE 0.9 % IV SOLN
INTRAVENOUS | Status: DC
  Filled 2015-10-30: qty 2.5

## 2015-10-30 MED ORDER — PROTAMINE SULFATE 10 MG/ML IV SOLN
INTRAVENOUS | Status: DC | PRN
  Administered 2015-10-30 (×2): 20 mg via INTRAVENOUS
  Administered 2015-10-30: 10 mg via INTRAVENOUS
  Administered 2015-10-30: 20 mg via INTRAVENOUS

## 2015-10-30 MED ORDER — FUROSEMIDE 20 MG PO TABS
20.0000 mg | ORAL_TABLET | Freq: Every day | ORAL | Status: DC
  Administered 2015-10-31 – 2015-11-02 (×3): 20 mg via ORAL
  Filled 2015-10-30 (×3): qty 1

## 2015-10-30 MED ORDER — MIDAZOLAM HCL 5 MG/5ML IJ SOLN
INTRAMUSCULAR | Status: DC | PRN
  Administered 2015-10-30: 2 mg via INTRAVENOUS

## 2015-10-30 MED ORDER — AMLODIPINE BESYLATE 10 MG PO TABS
10.0000 mg | ORAL_TABLET | Freq: Every evening | ORAL | Status: DC
  Administered 2015-10-31 – 2015-11-01 (×2): 10 mg via ORAL
  Filled 2015-10-30 (×3): qty 1

## 2015-10-30 MED ORDER — TRAMADOL HCL 50 MG PO TABS: 50.0000 mg | ORAL_TABLET | ORAL | Status: DC | PRN

## 2015-10-30 MED ORDER — IODIXANOL 320 MG/ML IV SOLN
INTRAVENOUS | Status: DC | PRN
  Administered 2015-10-30: 60.9 mL via INTRAVENOUS

## 2015-10-30 MED ORDER — ONDANSETRON HCL 4 MG/2ML IJ SOLN
INTRAMUSCULAR | Status: AC
  Filled 2015-10-30: qty 2

## 2015-10-30 MED ORDER — MIDAZOLAM HCL 2 MG/2ML IJ SOLN
INTRAMUSCULAR | Status: AC
  Filled 2015-10-30: qty 2

## 2015-10-30 MED ORDER — OXYCODONE HCL 5 MG PO TABS
5.0000 mg | ORAL_TABLET | ORAL | Status: DC | PRN
  Administered 2015-10-31: 10 mg via ORAL
  Filled 2015-10-30: qty 2

## 2015-10-30 MED ORDER — MORPHINE SULFATE (PF) 2 MG/ML IV SOLN
1.0000 mg | INTRAVENOUS | Status: DC | PRN
  Filled 2015-10-30 (×2): qty 1

## 2015-10-30 MED ORDER — LOSARTAN POTASSIUM 50 MG PO TABS
50.0000 mg | ORAL_TABLET | Freq: Every day | ORAL | Status: DC
  Administered 2015-10-30 – 2015-10-31 (×2): 50 mg via ORAL
  Filled 2015-10-30 (×2): qty 1

## 2015-10-30 MED ORDER — INSULIN REGULAR BOLUS VIA INFUSION
0.0000 [IU] | Freq: Three times a day (TID) | INTRAVENOUS | Status: DC
  Filled 2015-10-30: qty 10

## 2015-10-30 MED ORDER — ACETAMINOPHEN 500 MG PO TABS
1000.0000 mg | ORAL_TABLET | Freq: Four times a day (QID) | ORAL | Status: DC
  Administered 2015-10-31 (×2): 1000 mg via ORAL
  Filled 2015-10-30 (×3): qty 2

## 2015-10-30 MED ORDER — HEPARIN SODIUM (PORCINE) 1000 UNIT/ML IJ SOLN
INTRAMUSCULAR | Status: AC
  Filled 2015-10-30: qty 1

## 2015-10-30 MED ORDER — DEXTROSE 5 % IV SOLN
1.5000 g | Freq: Two times a day (BID) | INTRAVENOUS | Status: DC
  Administered 2015-10-31 (×2): 1.5 g via INTRAVENOUS
  Filled 2015-10-30 (×2): qty 1.5

## 2015-10-30 MED ORDER — MIDAZOLAM HCL 2 MG/2ML IJ SOLN
INTRAMUSCULAR | Status: AC
  Administered 2015-10-30: 2 mg via INTRAVENOUS
  Filled 2015-10-30: qty 2

## 2015-10-30 MED ORDER — CHLORHEXIDINE GLUCONATE CLOTH 2 % EX PADS
6.0000 | MEDICATED_PAD | Freq: Every day | CUTANEOUS | Status: DC
Start: 2015-10-31 — End: 2015-11-02
  Administered 2015-10-31: 6 via TOPICAL

## 2015-10-30 MED ORDER — HEPARIN SODIUM (PORCINE) 1000 UNIT/ML IJ SOLN
INTRAMUSCULAR | Status: DC | PRN
  Administered 2015-10-30: 7000 [IU] via INTRAVENOUS

## 2015-10-30 MED ORDER — LACTATED RINGERS IV SOLN
INTRAVENOUS | Status: DC
  Administered 2015-10-30: 07:00:00 via INTRAVENOUS

## 2015-10-30 MED ORDER — EPHEDRINE SULFATE 50 MG/ML IJ SOLN
INTRAMUSCULAR | Status: AC
  Filled 2015-10-30: qty 1

## 2015-10-30 MED ORDER — LIDOCAINE HCL (CARDIAC) 20 MG/ML IV SOLN
INTRAVENOUS | Status: AC
  Filled 2015-10-30: qty 5

## 2015-10-30 MED ORDER — MORPHINE SULFATE (PF) 2 MG/ML IV SOLN
2.0000 mg | INTRAVENOUS | Status: DC | PRN
  Administered 2015-10-30 – 2015-10-31 (×3): 2 mg via INTRAVENOUS
  Administered 2015-10-31: 4 mg via INTRAVENOUS
  Filled 2015-10-30: qty 2
  Filled 2015-10-30: qty 1

## 2015-10-30 MED ORDER — ATORVASTATIN CALCIUM 20 MG PO TABS
20.0000 mg | ORAL_TABLET | Freq: Every day | ORAL | Status: DC
  Administered 2015-10-30 – 2015-11-01 (×3): 20 mg via ORAL
  Filled 2015-10-30 (×3): qty 1

## 2015-10-30 MED ORDER — LEVOTHYROXINE SODIUM 100 MCG PO TABS
100.0000 ug | ORAL_TABLET | Freq: Every day | ORAL | Status: DC
  Administered 2015-10-31 – 2015-11-02 (×3): 100 ug via ORAL
  Filled 2015-10-30 (×3): qty 1

## 2015-10-30 MED ORDER — TIOTROPIUM BROMIDE MONOHYDRATE 18 MCG IN CAPS
18.0000 ug | ORAL_CAPSULE | Freq: Every day | RESPIRATORY_TRACT | Status: DC
  Administered 2015-11-02: 18 ug via RESPIRATORY_TRACT
  Filled 2015-10-30 (×2): qty 5

## 2015-10-30 SURGICAL SUPPLY — 99 items
ADAPTER UNIV SWAN GANZ BIP (ADAPTER) ×1 IMPLANT
ADAPTER UNV SWAN GANZ BIP (ADAPTER) ×2
BAG BANDED W/RUBBER/TAPE 36X54 (MISCELLANEOUS) ×6 IMPLANT
BAG DECANTER FOR FLEXI CONT (MISCELLANEOUS) IMPLANT
BAG SNAP BAND KOVER 36X36 (MISCELLANEOUS) ×9 IMPLANT
BLADE OSCILLATING /SAGITTAL (BLADE) IMPLANT
BLADE STERNUM SYSTEM 6 (BLADE) ×3 IMPLANT
BLADE SURG ROTATE 9660 (MISCELLANEOUS) IMPLANT
CABLE PACING FASLOC BIEGE (MISCELLANEOUS) ×3 IMPLANT
CABLE PACING FASLOC BLUE (MISCELLANEOUS) ×3 IMPLANT
CANNULA FEM VENOUS REMOTE 22FR (CANNULA) IMPLANT
CANNULA OPTISITE PERFUSION 16F (CANNULA) IMPLANT
CANNULA OPTISITE PERFUSION 18F (CANNULA) IMPLANT
CATH ANGIO 5F BER2 65CM (CATHETERS) ×3 IMPLANT
CATH DIAG EXPO 6F VENT PIG 145 (CATHETERS) ×6 IMPLANT
CATH EXPO 5FR AL1 (CATHETERS) ×3 IMPLANT
CATH S G BIP PACING (SET/KITS/TRAYS/PACK) ×6 IMPLANT
CATH SOFT-VU 4F 65 STRAIGHT (CATHETERS) ×1 IMPLANT
CATH SOFT-VU ST 4F 90CM (CATHETERS) ×3 IMPLANT
CATH SOFT-VU STRAIGHT 4F 65CM (CATHETERS) ×2
CLIP TI MEDIUM 24 (CLIP) ×3 IMPLANT
CLIP TI WIDE RED SMALL 24 (CLIP) ×3 IMPLANT
CONT SPEC 4OZ CLIKSEAL STRL BL (MISCELLANEOUS) ×9 IMPLANT
COVER BACK TABLE 24X17X13 BIG (DRAPES) ×3 IMPLANT
COVER DOME SNAP 22 D (MISCELLANEOUS) ×6 IMPLANT
COVER MAYO STAND STRL (DRAPES) ×3 IMPLANT
COVER TABLE BACK 60X90 (DRAPES) ×3 IMPLANT
CRADLE DONUT ADULT HEAD (MISCELLANEOUS) ×3 IMPLANT
DERMABOND ADVANCED (GAUZE/BANDAGES/DRESSINGS) ×2
DERMABOND ADVANCED .7 DNX12 (GAUZE/BANDAGES/DRESSINGS) ×1 IMPLANT
DRAPE INCISE IOBAN 66X45 STRL (DRAPES) IMPLANT
DRAPE SLUSH MACHINE 52X66 (DRAPES) ×3 IMPLANT
DRAPE TABLE COVER HEAVY DUTY (DRAPES) ×3 IMPLANT
DRSG TEGADERM 4X4.75 (GAUZE/BANDAGES/DRESSINGS) ×3 IMPLANT
ELECT REM PT RETURN 9FT ADLT (ELECTROSURGICAL) ×6
ELECTRODE REM PT RTRN 9FT ADLT (ELECTROSURGICAL) ×2 IMPLANT
FELT TEFLON 6X6 (MISCELLANEOUS) ×3 IMPLANT
FEMORAL VENOUS CANN RAP (CANNULA) IMPLANT
GAUZE SPONGE 4X4 12PLY STRL (GAUZE/BANDAGES/DRESSINGS) ×3 IMPLANT
GLOVE BIO SURGEON STRL SZ8 (GLOVE) ×6 IMPLANT
GLOVE EUDERMIC 7 POWDERFREE (GLOVE) ×6 IMPLANT
GLOVE ORTHO TXT STRL SZ7.5 (GLOVE) ×6 IMPLANT
GOWN STRL REUS W/ TWL LRG LVL3 (GOWN DISPOSABLE) ×3 IMPLANT
GOWN STRL REUS W/ TWL XL LVL3 (GOWN DISPOSABLE) ×6 IMPLANT
GOWN STRL REUS W/TWL LRG LVL3 (GOWN DISPOSABLE) ×6
GOWN STRL REUS W/TWL XL LVL3 (GOWN DISPOSABLE) ×12
GUIDEWIRE SAF TJ AMPL .035X180 (WIRE) ×3 IMPLANT
GUIDEWIRE SAFE TJ AMPLATZ EXST (WIRE) ×3 IMPLANT
GUIDEWIRE STRAIGHT .035 260CM (WIRE) ×6 IMPLANT
INSERT FOGARTY 61MM (MISCELLANEOUS) ×3 IMPLANT
INSERT FOGARTY SM (MISCELLANEOUS) ×6 IMPLANT
INSERT FOGARTY XLG (MISCELLANEOUS) IMPLANT
KIT BASIN OR (CUSTOM PROCEDURE TRAY) ×3 IMPLANT
KIT DILATOR VASC 18G NDL (KITS) IMPLANT
KIT HEART LEFT (KITS) ×3 IMPLANT
KIT ROOM TURNOVER OR (KITS) ×3 IMPLANT
KIT SUCTION CATH 14FR (SUCTIONS) ×6 IMPLANT
NEEDLE PERC 18GX7CM (NEEDLE) ×3 IMPLANT
NS IRRIG 1000ML POUR BTL (IV SOLUTION) ×9 IMPLANT
PACK AORTA (CUSTOM PROCEDURE TRAY) ×3 IMPLANT
PAD ARMBOARD 7.5X6 YLW CONV (MISCELLANEOUS) ×6 IMPLANT
PAD ELECT DEFIB RADIOL ZOLL (MISCELLANEOUS) ×3 IMPLANT
SHEATH PINNACLE 6F 10CM (SHEATH) ×6 IMPLANT
SLEEVE REPOSITIONING LENGTH 30 (MISCELLANEOUS) ×3 IMPLANT
SPONGE GAUZE 4X4 12PLY STER LF (GAUZE/BANDAGES/DRESSINGS) ×3 IMPLANT
SPONGE LAP 4X18 X RAY DECT (DISPOSABLE) ×3 IMPLANT
STOPCOCK 4 WAY LG BORE MALE ST (IV SETS) ×3 IMPLANT
STOPCOCK MORSE 400PSI 3WAY (MISCELLANEOUS) ×9 IMPLANT
SUT ETHIBOND X763 2 0 SH 1 (SUTURE) ×3 IMPLANT
SUT GORETEX CV 4 TH 22 36 (SUTURE) ×3 IMPLANT
SUT GORETEX CV4 TH-18 (SUTURE) ×9 IMPLANT
SUT GORETEX TH-18 36 INCH (SUTURE) ×6 IMPLANT
SUT PROLENE 3 0 SH1 36 (SUTURE) IMPLANT
SUT PROLENE 4 0 RB 1 (SUTURE) ×2
SUT PROLENE 4-0 RB1 .5 CRCL 36 (SUTURE) ×1 IMPLANT
SUT PROLENE 5 0 C 1 36 (SUTURE) ×6 IMPLANT
SUT PROLENE 6 0 C 1 30 (SUTURE) ×6 IMPLANT
SUT SILK  1 MH (SUTURE) ×2
SUT SILK 1 MH (SUTURE) ×1 IMPLANT
SUT SILK 2 0 SH (SUTURE) ×3 IMPLANT
SUT SILK 2 0 SH CR/8 (SUTURE) IMPLANT
SUT VIC AB 2-0 CT1 27 (SUTURE) ×2
SUT VIC AB 2-0 CT1 TAPERPNT 27 (SUTURE) ×1 IMPLANT
SUT VIC AB 2-0 CTX 36 (SUTURE) IMPLANT
SUT VIC AB 2-0 SH 27 (SUTURE) ×2
SUT VIC AB 2-0 SH 27XBRD (SUTURE) ×1 IMPLANT
SUT VIC AB 3-0 SH 8-18 (SUTURE) ×6 IMPLANT
SYR 30ML LL (SYRINGE) ×6 IMPLANT
SYR 50ML LL SCALE MARK (SYRINGE) ×6 IMPLANT
TAPE CLOTH SURG 4X10 WHT LF (GAUZE/BANDAGES/DRESSINGS) ×3 IMPLANT
TOWEL OR 17X26 10 PK STRL BLUE (TOWEL DISPOSABLE) ×6 IMPLANT
TRANSDUCER W/STOPCOCK (MISCELLANEOUS) ×6 IMPLANT
TRAY FOLEY IC TEMP SENS 16FR (CATHETERS) ×3 IMPLANT
TUBING ART PRESS 72  MALE/FEM (TUBING) ×2
TUBING ART PRESS 72 MALE/FEM (TUBING) ×1 IMPLANT
TUBING HIGH PRESSURE 120CM (CONNECTOR) ×3 IMPLANT
VALVE HEART TRANSCATH SZ3 23MM (Prosthesis & Implant Heart) ×3 IMPLANT
WIRE .035 3MM-J 145CM (WIRE) ×3 IMPLANT
WIRE AMPLATZ SS-J .035X180CM (WIRE) ×3 IMPLANT

## 2015-10-30 NOTE — Care Management Note (Addendum)
Case Management Note  Patient Details  Name: Karen Chambers MRN: [id number] Date of Birth: Aug 25, 1946  Subjective/Objective:      Pt is s/p TAVR              Action/Plan:   Pt is independent from home with strong family support.  Husband/family will provide 24 hour supervision post discharge.  Pt already has walker at home due to RA in back and knee in addition to oxygen concentrator in the home for night use only 3 liters , pt states she has purchased concentrator and has portable if needed to transport home.  CM will continue to follow for discharge needs  Expected Discharge Date:                  Expected Discharge Plan:  Home/Self Care  In-House Referral:     Discharge planning Services  CM Consult  Post Acute Care Choice:    Choice offered to:     DME Arranged:    DME Agency:     HH Arranged:    HH Agency:     Status of Service:  In process, will continue to follow  Medicare Important Message Given:    Date Medicare IM Given:    Medicare IM give by:    Date Additional Medicare IM Given:    Additional Medicare Important Message give by:     If discussed at [place] of Stay Meetings, dates discussed:    Additional Comments:  Maryclare Labrador, RN 10/30/2015, 3:23 PM

## 2015-10-30 NOTE — Anesthesia Preprocedure Evaluation (Addendum)
Anesthesia Evaluation  Patient identified by MRN, date of birth, ID band Patient awake    Reviewed: Allergy & Precautions, NPO status , Patient's Chart, lab work & pertinent test results  Airway Mallampati: II   Neck ROM: full    Dental  (+) Edentulous Upper, Partial Lower, Dental Advisory Given   Pulmonary shortness of breath, COPD, former smoker,    breath sounds clear to auscultation       Cardiovascular hypertension, Pt. on home beta blockers + CAD and +CHF  + Valvular Problems/Murmurs AS  Rhythm:regular Rate:Normal     Neuro/Psych Depression TIACVA    GI/Hepatic PUD,   Endo/Other  Hypothyroidism   Renal/GU Renal InsufficiencyRenal disease     Musculoskeletal  (+) Arthritis ,   Abdominal   Peds  Hematology   Anesthesia Other Findings   Reproductive/Obstetrics                            Anesthesia Physical Anesthesia Plan  ASA: IV  Anesthesia Plan: General   Post-op Pain Management:    Induction: Intravenous  Airway Management Planned: Oral ETT  Additional Equipment: Arterial line, CVP, PA Cath, TEE and Ultrasound Guidance Line Placement  Intra-op Plan:   Post-operative Plan: Post-operative intubation/ventilation  Informed Consent: I have reviewed the patients History and Physical, chart, labs and discussed the procedure including the risks, benefits and alternatives for the proposed anesthesia with the patient or authorized representative who has indicated his/her understanding and acceptance.     Plan Discussed with: CRNA, Anesthesiologist and Surgeon  Anesthesia Plan Comments:         Anesthesia Quick Evaluation

## 2015-10-30 NOTE — Progress Notes (Signed)
CT surgery p.m. Rounds  Doing well after T aVR Patient receiving second unit of packed cells for hemoglobin less than 7--groin incision inspected and is without hematoma or active bleeding Patient is comfortable and alert Soft systolic flow murmur, stable blood pressure and heart rhythm

## 2015-10-30 NOTE — Transfer of Care (Signed)
Immediate Anesthesia Transfer of Care Note  Patient: Karen Chambers  Procedure(s) Performed: Procedure(s): TRANSCATHETER AORTIC VALVE REPLACEMENT, TRANSFEMORAL (N/A) TRANSESOPHAGEAL ECHOCARDIOGRAM (TEE) (N/A)  Patient Location: ICU  Anesthesia Type:General  Level of Consciousness: Patient remains intubated per anesthesia plan  Airway & Oxygen Therapy: Patient remains intubated per anesthesia plan and Patient placed on Ventilator (see vital sign flow sheet for setting)  Post-op Assessment: Report given to RN  Post vital signs: Reviewed and stable  Last Vitals: There were no vitals filed for this visit.  Complications: No apparent anesthesia complications

## 2015-10-30 NOTE — Progress Notes (Signed)
  Echocardiogram Echocardiogram Transesophageal has been performed.  Karen Chambers 10/30/2015, 1:37 PM

## 2015-10-30 NOTE — Anesthesia Procedure Notes (Signed)
Procedure Name: Intubation Date/Time: 10/30/2015 11:56 AM Performed by: Barrington Ellison Pre-anesthesia Checklist: Patient identified, Emergency Drugs available, Suction available, Patient being monitored and Timeout performed Patient Re-evaluated:Patient Re-evaluated prior to inductionOxygen Delivery Method: Circle system utilized Preoxygenation: Pre-oxygenation with 100% oxygen Intubation Type: IV induction Ventilation: Mask ventilation without difficulty Laryngoscope Size: Mac and 3 Grade View: Grade I Tube type: Oral Tube size: 8.0 mm Number of attempts: 1 Airway Equipment and Method: Stylet Placement Confirmation: ETT inserted through vocal cords under direct vision,  positive ETCO2 and breath sounds checked- equal and bilateral Secured at: 21 cm Tube secured with: Tape Dental Injury: Teeth and Oropharynx as per pre-operative assessment

## 2015-10-30 NOTE — Anesthesia Postprocedure Evaluation (Signed)
Anesthesia Post Note  Patient: Karen Chambers  Procedure(s) Performed: Procedure(s) (LRB): TRANSCATHETER AORTIC VALVE REPLACEMENT, TRANSFEMORAL (N/A) TRANSESOPHAGEAL ECHOCARDIOGRAM (TEE) (N/A)  Patient location during evaluation: ICU Anesthesia Type: General Level of consciousness: patient remains intubated per anesthesia plan Pain management: pain level controlled Vital Signs Assessment: post-procedure vital signs reviewed and stable Respiratory status: patient remains intubated per anesthesia plan Cardiovascular status: stable Anesthetic complications: no    Last Vitals:  Filed Vitals:   10/30/15 1615 10/30/15 1630  BP:    Pulse: 78 83  Temp: 36.6 C 36.6 C  Resp: 14 18    Last Pain:  Filed Vitals:   10/30/15 1636  PainSc: Karen Chambers

## 2015-10-30 NOTE — Procedures (Signed)
Extubation Procedure Note  Patient Details:   Name: Karen Chambers DOB: 1945-12-01 MRN: [id number]   Airway Documentation:     Evaluation  O2 sats: stable throughout Complications: No apparent complications Patient did tolerate procedure well. Bilateral Breath Sounds: Clear   Yes   Pt. Was extubated to a 4L [place] without any complications, dyspnea or stridor noted. Pt. Achieved a goal of -40 on NIF & 900L on VC. Pt. Was instructed on IS x 5, highest goal reached was 1223mL.  Shantal Roan, Eddie North 10/30/2015, 4:40 PM

## 2015-10-30 NOTE — Interval H&P Note (Signed)
History and Physical Interval Note:  10/30/2015 10:39 AM  Karen Chambers  has presented today for surgery, with the diagnosis of SEVERE AS  The various methods of treatment have been discussed with the patient and family. After consideration of risks, benefits and other options for treatment, the patient has consented to  Procedure(s): TRANSCATHETER AORTIC VALVE REPLACEMENT, TRANSFEMORAL (N/A) TRANSESOPHAGEAL ECHOCARDIOGRAM (TEE) (N/A) as a surgical intervention .  The patient's history has been reviewed, patient examined, no change in status, stable for surgery.  I have reviewed the patient's chart and labs.  Questions were answered to the patient's satisfaction.     Gaye Pollack

## 2015-10-30 NOTE — Progress Notes (Signed)
UR Completed. Yoali Conry, RN, BSN.  [phone] 

## 2015-10-30 NOTE — CV Procedure (Signed)
[facility]  TAVR OPERATIVE NOTE   Date of Procedure:  10/30/2015  Preoperative Diagnosis: Severe Aortic Stenosis   Postoperative Diagnosis: Same   Procedure:    Transcatheter Aortic Valve Replacement - Transfemoral Approach  Edwards Sapien 3 THV (size 23 mm, model # [id number], serial # [id number])   Co-Surgeons:  Gaye Pollack, MD and Lauree Chandler, MD  Anesthesiologist:  Hodiern  Echocardiographer:  Meda Coffee  Pre-operative Echo Findings:  Severe aortic stenosis  Normal left ventricular systolic function  Post-operative Echo Findings:  Trivial paravalvular leak  Normal left ventricular systolic function  BRIEF CLINICAL NOTE AND INDICATIONS FOR SURGERY  70 yo female with history of severe aortic valve stenosis, HTN, COPD on home O2, RA, 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 She is followed by Dr. Aundra Dubin in cardiology and Dr. Chase Caller in Pulmonary clinic. She has had a murmur for years. Recent CHF exacerbation leading to echo on 07/30/15 with normal LV systolic function, severe aortic stenosis with mean gradient of 58 mm Hg. Cardiac cath 08/09/15 with mild non-obstructive CAD. (see below). She is overall doing well but over the last three months she describes chest pressure with ambulation. She has baseline shortness of breath with minimal exertion. She underwent CT scans suggesting a 23 mm valve with suitable femoral artery access for TAVR.   Durng the course of the patient's preoperative work up they have been evaluated comprehensively by a multidisciplinary team of specialists coordinated through the [facility] in the [facility].  They have been demonstrated to suffer from symptomatic severe aortic stenosis as noted above. The patient has been counseled extensively as to the relative risks and benefits of all options for the treatment of severe aortic stenosis  including long term medical therapy, conventional surgery for aortic valve replacement, and transcatheter aortic valve replacement.  The patient has been independently evaluated by two cardiac surgeons including Dr Roxy Manns and Dr. Cyndia Bent, and they are felt to be at high risk for conventional surgical aortic valve replacement. Both surgeons indicated the patient would be a poor candidate for conventional surgery (predicted risk of mortality >15% and/or predicted risk of permanent morbidity >50%) because of comorbidities including severe COPD, RA.   Based upon review of all of the patient's preoperative diagnostic tests they are felt to be candidate for transcatheter aortic valve replacement using the transfemoral approach as an alternative to high risk conventional surgery.    Following the decision to proceed with transcatheter aortic valve replacement, a discussion has been held regarding what types of management strategies would be attempted intraoperatively in the event of life-threatening complications, including whether or not the patient would be considered a candidate for the use of cardiopulmonary bypass and/or conversion to open sternotomy for attempted surgical intervention.  The patient has been advised of a variety of complications that might develop peculiar to this approach including but not limited to risks of death, stroke, paravalvular leak, aortic dissection or other major vascular complications, aortic annulus rupture, device embolization, cardiac rupture or perforation, acute myocardial infarction, arrhythmia, heart block or bradycardia requiring permanent pacemaker placement, congestive heart failure, respiratory failure, renal failure, pneumonia, infection, other late complications related to structural valve deterioration or migration, or other complications that might ultimately cause a temporary or permanent loss of functional independence or other long term morbidity.  The patient provides  full informed consent for the procedure as described and all questions were answered preoperatively.  DETAILS OF THE OPERATIVE PROCEDURE  PREPARATION:   The patient is brought to the operating room on the above mentioned date and central monitoring was established by the anesthesia team including placement of Swan-Ganz catheter and radial arterial line. The patient is placed in the supine position on the operating table.  Intravenous antibiotics are administered. General endotracheal anesthesia is induced uneventfully. A Foley catheter is placed.  Baseline transesophageal echocardiogram was performed. The patient's chest, abdomen, both groins, and both lower extremities are prepared and draped in a sterile manner. A time out procedure is performed.   PERIPHERAL ACCESS:   Using the modified Seldinger technique, femoral arterial and venous access were obtained with placement of 6 Fr sheaths on the left side.  A pigtail diagnostic catheter was passed through the femoral arterial sheath under fluoroscopic guidance into the aortic root.  A temporary transvenous pacemaker catheter was passed through the femoral venous sheath under fluoroscopic guidance into the right ventricle.  The pacemaker was tested to ensure stable lead placement and pacemaker capture. Aortic root angiography was performed in order to determine the optimal angiographic angle for valve deployment.  TRANSFEMORAL ACCESS:  A right femoral arterial cutdown was performed by Dr Cyndia Bent. Please see his separate operative note for details. The patient was heparinized systemically and ACT verified > 250 seconds.    A 14Fr transfemoral E-sheath was introduced into the right femoral artery after progressively dilating over an Amplatz superstiff wire. An AL-1 catheter was used to direct a straight-tip exchange length wire across the native aortic valve into the left ventricle. I could not advance the pigtail into the LV. I used a straight  catheter to cross into the LV. The Amplatz extra stiff wire was advanced through this catheter into the LV. This was exchanged out for a pigtail catheter and the wire was repositioned. Position was confirmed in the LV apex. Simultaneous LV and Ao pressures were recorded.  At that point, BAV was performed using a 20 mm valvuloplasty balloon.  Once optimal position was achieved, BAV was done under rapid ventricular pacing at 180 bpm. The patient recovered well hemodynamically.   TRANSCATHETER HEART VALVE DEPLOYMENT:  An Edwards Sapien 3 THV (size 27mm) was prepared and crimped per manufacturer's guidelines, and the proper orientation of the valve is confirmed on the [employer] delivery system. The valve was advanced through the introducer sheath using normal technique until in an appropriate position in the abdominal aorta beyond the sheath tip. The balloon was then retracted and using the fine-tuning wheel was centered on the valve. The valve was then advanced across the aortic arch using appropriate flexion of the catheter. The valve was carefully positioned across the aortic valve annulus. The Commander catheter was retracted using normal technique. Once final position of the valve has been confirmed by angiographic assessment, the valve is deployed while temporarily holding ventilation and during rapid ventricular pacing to maintain systolic blood pressure < 50 mmHg and pulse pressure < 10 mmHg. The balloon inflation is held for >3 seconds after reaching full deployment volume. Once the balloon has fully deflated the balloon is retracted into the ascending aorta and valve function is assessed using TEE. There is felt to be trivial paravalvular leak and no central aortic insufficiency.  The patient's hemodynamic recovery following valve deployment is good.  The deployment balloon and guidewire are both removed. Echo demostrated acceptable post-procedural gradients, stable mitral valve function, and  trivial AI.    PROCEDURE COMPLETION:  The sheath was then  removed and arteriotomy repaired by Dr Cyndia Bent. Please see his separate report for details. Protamine was administered once femoral arterial repair was complete. The temporary pacemaker, pigtail catheters and femoral sheaths were removed with manual pressure used for hemostasis.   The patient tolerated the procedure well and is transported to the surgical intensive care in stable condition. There were no immediate intraoperative complications. All sponge instrument and needle counts are verified correct at completion of the operation.   No blood products were administered during the operation.  The patient received a total of 60 mL of intravenous contrast during the procedure.  MCALHANY,CHRISTOPHER MD 10/30/2015 3:25 PM

## 2015-10-30 NOTE — Op Note (Signed)
CARDIOTHORACIC SURGERY OPERATIVE NOTE  Date of Procedure:  10/30/2015  Preoperative Diagnosis: Severe Aortic Stenosis   Postoperative Diagnosis: Same   Procedure:    Transcatheter Aortic Valve Replacement - Right Transfemoral Approach  Edwards Sapien 3 Transcatheter Heart Valve (size 23 mm, model # [id number], serial # [id number])   Co-Surgeons:  Gaye Pollack, MD and Lauree Chandler, MD  Assistants:   none  Anesthesiologist:  Albertha Ghee, MD  Echocardiographer:  Ena Dawley, MD  Pre-operative Echo Findings:   severe aortic stenosis   normal left ventricular systolic function   Post-operative Echo Findings:  trivial paravalvular leak   normal left ventricular systolic function      DETAILS OF THE OPERATIVE PROCEDURE  The majority of the procedure is documented separately in a procedure note by Dr. Angelena Form.   TRANSFEMORAL ACCESS:   A small incision is made in the right groin immediately over the common femoral artery. The subcutaneous tissues are divided with electrocautery and the anterior surface of the common femoral artery is identified. Sharp dissection is utilized to free up the artery proximally and distally and the vessel is encircled with a vessel loop.  A pair of CV-4 Gore-tex sutures are place as diamond-shaped purse-strings on the anterior surface of the femoral artery.  The patient is heparinized systemically and ACT verified > 250 seconds.  The common femoral artery is punctured using an  18 gauge needle and a soft J-tipped guidewire is passed into the common iliac artery under fluoroscopic guidance.  A 6 Fr straight diagnostic catheter is placed over the guidewire and the guidewire is removed.  An Amplatz super stiff guidewire is passed through the sheath into the descending thoracic aorta and the introducing diagnostic catheter is removed.  Serial dilators are passed over the guidewire under continuous fluoroscopic guidance, making certain that each  dilator passes easily all of the way into the distal abdominal aorta.  A 14 Fr Commander introducer sheath is passed over the guidewire into the abdominal aorta.  The introducing dilator is removed, the sheath is flushed with heparinized saline, and the sheath is secured to the skin.    FEMORAL SHEATH REMOVAL AND ARTERIAL CLOSURE:  After the completion of successful valve deployment as documented separately by Dr. Angelena Form, the femoral artery sheath is removed and the arteriotomy is closed using the previously placed Gore-tex purse-string sutures. Once the repair has been completed protamine was administered to reverse the anticoagulation.There was a strong pulse in the common femoral artery distal to the repair. The incision is irrigated with saline solution and subsequently closed in multiple layers using absorbable suture.  The skin incision is closed using a subcuticular skin closure.     Gaye Pollack MD 10/30/2015

## 2015-10-31 ENCOUNTER — Other Ambulatory Visit: Payer: Medicare HMO

## 2015-10-31 ENCOUNTER — Inpatient Hospital Stay (HOSPITAL_COMMUNITY): Payer: Medicare HMO

## 2015-10-31 ENCOUNTER — Encounter (HOSPITAL_COMMUNITY): Payer: Self-pay | Admitting: Cardiovascular Disease

## 2015-10-31 DIAGNOSIS — I350 Nonrheumatic aortic (valve) stenosis: Principal | ICD-10-CM

## 2015-10-31 DIAGNOSIS — R0600 Dyspnea, unspecified: Secondary | ICD-10-CM

## 2015-10-31 DIAGNOSIS — D6859 Other primary thrombophilia: Secondary | ICD-10-CM

## 2015-10-31 DIAGNOSIS — D649 Anemia, unspecified: Secondary | ICD-10-CM

## 2015-10-31 LAB — GLUCOSE, CAPILLARY
GLUCOSE-CAPILLARY: 91 mg/dL (ref 65–99)
GLUCOSE-CAPILLARY: 96 mg/dL (ref 65–99)
Glucose-Capillary: 108 mg/dL — ABNORMAL HIGH (ref 65–99)
Glucose-Capillary: 97 mg/dL (ref 65–99)
Glucose-Capillary: 118 mg/dL — ABNORMAL HIGH (ref 65–99)

## 2015-10-31 LAB — BASIC METABOLIC PANEL
ANION GAP: 9 (ref 5–15)
BUN: 28 mg/dL — AB (ref 6–20)
CHLORIDE: 106 mmol/L (ref 101–111)
CO2: 22 mmol/L (ref 22–32)
Calcium: 9.1 mg/dL (ref 8.9–10.3)
Creatinine, Ser: 1.93 mg/dL — ABNORMAL HIGH (ref 0.44–1.00)
GFR calc Af Amer: 29 mL/min — ABNORMAL LOW (ref >60)
GFR calc non Af Amer: 25 mL/min — ABNORMAL LOW (ref >60)
GLUCOSE: 107 mg/dL — AB (ref 65–99)
POTASSIUM: 4.1 mmol/L (ref 3.5–5.1)
Sodium: 137 mmol/L (ref 135–145)

## 2015-10-31 LAB — CBC
HCT: 29.9 % — ABNORMAL LOW (ref 36.0–46.0)
HEMOGLOBIN: 9 g/dL — AB (ref 12.0–15.0)
MCH: 23.9 pg — AB (ref 26.0–34.0)
MCHC: 30.1 g/dL (ref 30.0–36.0)
MCV: 79.5 fL (ref 78.0–100.0)
Platelets: 201 10*3/uL (ref 150–400)
RBC: 3.76 MIL/uL — AB (ref 3.87–5.11)
RDW: 16.6 % — ABNORMAL HIGH (ref 11.5–15.5)
WBC: 9.5 10*3/uL (ref 4.0–10.5)

## 2015-10-31 LAB — HEPARIN LEVEL (UNFRACTIONATED): Heparin Unfractionated: 0.16 IU/mL — ABNORMAL LOW (ref 0.30–0.70)

## 2015-10-31 LAB — MAGNESIUM: MAGNESIUM: 2.8 mg/dL — AB (ref 1.7–2.4)

## 2015-10-31 MED ORDER — OXYCODONE HCL 5 MG PO TABS
5.0000 mg | ORAL_TABLET | ORAL | Status: DC | PRN
  Administered 2015-11-01: 10 mg via ORAL
  Filled 2015-10-31: qty 2

## 2015-10-31 MED ORDER — SODIUM CHLORIDE 0.9 % IV SOLN: 250.0000 mL | INTRAVENOUS | Status: DC | PRN

## 2015-10-31 MED ORDER — WARFARIN SODIUM 5 MG PO TABS: 5.0000 mg | ORAL_TABLET | Freq: Once | ORAL | Status: DC

## 2015-10-31 MED ORDER — BISACODYL 5 MG PO TBEC: 10.0000 mg | DELAYED_RELEASE_TABLET | Freq: Every day | ORAL | Status: DC | PRN

## 2015-10-31 MED ORDER — DOCUSATE SODIUM 100 MG PO CAPS
200.0000 mg | ORAL_CAPSULE | Freq: Every day | ORAL | Status: DC
  Administered 2015-11-01 – 2015-11-02 (×2): 200 mg via ORAL
  Filled 2015-10-31 (×2): qty 2

## 2015-10-31 MED ORDER — ACETAMINOPHEN 325 MG PO TABS
650.0000 mg | ORAL_TABLET | Freq: Four times a day (QID) | ORAL | Status: DC | PRN
  Administered 2015-10-31 – 2015-11-01 (×2): 650 mg via ORAL
  Filled 2015-10-31 (×2): qty 2

## 2015-10-31 MED ORDER — SODIUM CHLORIDE 0.9% FLUSH: 3.0000 mL | INTRAVENOUS | Status: DC | PRN

## 2015-10-31 MED ORDER — METOPROLOL TARTRATE 12.5 MG HALF TABLET
12.5000 mg | ORAL_TABLET | Freq: Two times a day (BID) | ORAL | Status: DC
  Administered 2015-10-31: 12.5 mg via ORAL
  Filled 2015-10-31: qty 1

## 2015-10-31 MED ORDER — BISACODYL 10 MG RE SUPP: 10.0000 mg | Freq: Every day | RECTAL | Status: DC | PRN

## 2015-10-31 MED ORDER — FAMOTIDINE 20 MG PO TABS
20.0000 mg | ORAL_TABLET | Freq: Two times a day (BID) | ORAL | Status: DC
  Administered 2015-10-31 – 2015-11-01 (×2): 20 mg via ORAL
  Filled 2015-10-31 (×2): qty 1

## 2015-10-31 MED ORDER — MOVING RIGHT ALONG BOOK
Freq: Once | Status: DC
  Filled 2015-10-31: qty 1

## 2015-10-31 MED ORDER — HEPARIN BOLUS VIA INFUSION
2000.0000 [IU] | Freq: Once | INTRAVENOUS | Status: AC
  Administered 2015-10-31: 2000 [IU] via INTRAVENOUS
  Filled 2015-10-31: qty 2000

## 2015-10-31 MED ORDER — SODIUM CHLORIDE 0.9% FLUSH: 3.0000 mL | Freq: Two times a day (BID) | INTRAVENOUS | Status: DC

## 2015-10-31 MED ORDER — TRAMADOL HCL 50 MG PO TABS
50.0000 mg | ORAL_TABLET | ORAL | Status: DC | PRN
  Administered 2015-10-31 – 2015-11-01 (×2): 100 mg via ORAL
  Filled 2015-10-31 (×2): qty 2

## 2015-10-31 MED ORDER — HEPARIN (PORCINE) IN NACL 100-0.45 UNIT/ML-% IJ SOLN
1200.0000 [IU]/h | INTRAMUSCULAR | Status: DC
  Administered 2015-10-31: 1200 [IU]/h via INTRAVENOUS
  Administered 2015-10-31: 1000 [IU]/h via INTRAVENOUS
  Administered 2015-11-01 (×2): 1100 [IU]/h via INTRAVENOUS
  Filled 2015-10-31 (×4): qty 250

## 2015-10-31 MED ORDER — ONDANSETRON HCL 4 MG PO TABS: 4.0000 mg | ORAL_TABLET | Freq: Four times a day (QID) | ORAL | Status: DC | PRN

## 2015-10-31 MED ORDER — ONDANSETRON HCL 4 MG/2ML IJ SOLN: 4.0000 mg | Freq: Four times a day (QID) | INTRAMUSCULAR | Status: DC | PRN

## 2015-10-31 MED ORDER — WARFARIN - PHARMACIST DOSING INPATIENT: Freq: Every day | Status: DC

## 2015-10-31 NOTE — Progress Notes (Signed)
  Echocardiogram 2D Echocardiogram has been performed.  Jennette Dubin 10/31/2015, 12:00 PM

## 2015-10-31 NOTE — Progress Notes (Signed)
Patient lying in bed, is having back pain, will bring pain meds with night medication. No distress or other needs expressed at this time. Heparin restarted per new orders from pharmacy call light within reach.

## 2015-10-31 NOTE — Progress Notes (Signed)
1730 pm  Attempted to give  Heparin 2000 u bolus from bag.   IV site leaking severely.   IV team notified.  Mervyn Skeeters, RN

## 2015-10-31 NOTE — Progress Notes (Signed)
Received in room 2W11 from [address], alert & oriented, vss,  drsgs both groins clean, dry, intact.   IV site  Left antecubital  With Heparin drip @ 10cc/hr   Denies pain at this time.  o2 at 4 l/m  Mervyn Skeeters, RN

## 2015-10-31 NOTE — Progress Notes (Signed)
IV team restarted new IV site @ 1850pm.  Patient did not receive 2000u bolus.  Pharmacy being notified @ 1925pm  Mervyn Skeeters, RN

## 2015-10-31 NOTE — Progress Notes (Signed)
ANTICOAGULATION CONSULT NOTE - Initial Consult  Pharmacy Consult for heparin and warfarin Indication: hypercoagulable  Allergies  Allergen Reactions  . Allopurinol Itching, Nausea Only, Rash and Other (See Comments)    Top of head to tip of toes with rash; aches all over  . Cephalexin Other (See Comments)    Severe case of thrush in mouth/tongue.  Tongue raw and hard and swollen.  . Nitrofurantoin Hives, Itching, Nausea And Vomiting, Rash and Other (See Comments)    Body aches, like I have the flu  . Ace Inhibitors Other (See Comments)    cough  . Sulfonamide Derivatives Hives, Itching and Rash    Patient Measurements: Height: 5\' 3"  (160 cm) Weight: 161 lb 6.4 oz (73.211 kg) IBW/kg (Calculated) : 52.4 Heparin Dosing Weight: 70kg  Vital Signs: Temp: 97.7 F (36.5 C) (02/08 1500) Temp Source: Oral (02/08 1500) BP: 152/60 mmHg (02/08 1200) Pulse Rate: 91 (02/08 1200)  Labs:  Recent Labs  10/30/15 0717  10/30/15 1250 10/30/15 1343 10/30/15 1438 10/30/15 1441 10/30/15 2145 10/31/15 0405 10/31/15 1549  HGB  --   < > 6.5* 6.8* 6.3* 13.6 9.4* 9.0*  --   HCT  --   < > 19.0* 20.0* 21.7* 40.0 30.9* 29.9*  --   PLT  --   --   --   --  248  --   --  201  --   APTT 48*  --   --   --  43*  --   --   --   --   LABPROT 16.7*  --   --   --  18.1*  --   --   --   --   INR 1.34  --   --   --  1.49  --   --   --   --   HEPARINUNFRC  --   --   --   --   --   --   --   --  0.16*  CREATININE  --   < > 1.70* 1.70*  --   --   --  1.93*  --   < > = values in this interval not displayed.  Estimated Creatinine Clearance: 25.6 mL/min (by C-G formula based on Cr of 1.93).   Medical History: Past Medical History  Diagnosis Date  . Other and unspecified hyperlipidemia 401.9  . Hypothyroidism   . Obesity, unspecified   . Gout   . Low back pain     buldging disc ,and herniated disc  . Rheumatoid arthritis(714.0)   . Cough 11/12/2010  . Hypertension   . Coagulopathy ([place])   . Von  Willebrand disease ([place])     Dr. Jana Hakim follows  . Aortic stenosis   . CAD (coronary artery disease)   . TIA (transient ischemic attack)   . Chronic diastolic (congestive) heart failure ([place])   . Chronic kidney disease   . Antiphospholipid antibody syndrome ([place]) 02/03/2012  . IDA (iron deficiency anemia) 06/23/2014  . Complication of anesthesia     slow awaken from anesthesia  . Pneumonia   . COPD (chronic obstructive pulmonary disease) ([facility])     home O2-3 liters/min.  Marland Kitchen Heart murmur   . Stroke ([place]) 2000    TIA  . Shortness of breath dyspnea   . Depression     relative to pain    Medications:  Prescriptions prior to admission  Medication Sig Dispense Refill Last Dose  . acetaminophen (TYLENOL ARTHRITIS PAIN)  650 MG CR tablet Take 650-1,300 mg by mouth 2 (two) times daily.   10/29/2015 at Unknown time  . amLODipine (NORVASC) 10 MG tablet Take 1 tablet (10 mg total) by mouth every evening. 90 tablet 4 10/30/2015 at 0500  . aspirin 81 MG tablet Take 81 mg by mouth at bedtime.    Past Week at Unknown time  . atorvastatin (LIPITOR) 20 MG tablet Take 1 tablet (20 mg total) by mouth daily. 90 tablet 3 10/29/2015 at Unknown time  . febuxostat (ULORIC) 40 MG tablet Take 40 mg by mouth every morning.    10/29/2015 at Unknown time  . furosemide (LASIX) 20 MG tablet Take 1 tablet (20 mg total) by mouth daily. 30 tablet 3 10/29/2015 at Unknown time  . levothyroxine (SYNTHROID, LEVOTHROID) 100 MCG tablet TAKE 1 TABLET DAILY 90 tablet 3 10/30/2015 at Unknown time  . losartan (COZAAR) 50 MG tablet Take 1 tablet (50 mg total) by mouth daily. 30 tablet 3 10/29/2015 at Unknown time  . metoprolol succinate (TOPROL-XL) 100 MG 24 hr tablet Take 1 tablet (100 mg total) by mouth 2 (two) times daily. Take with or immediately following a meal. 180 tablet 3 10/29/2015 at Unknown time  . Multiple Vitamin (MULTIVITAMIN) capsule Take 1 capsule by mouth daily at 12 noon.    Taking  . Omega 3 1200 MG CAPS Take 1,500 mg by  mouth daily at 12 noon. 1200mg  softgel + 300mg  softgel.   Taking  . potassium chloride (K-DUR) 10 MEQ tablet Take 1 tablet (10 mEq total) by mouth daily. 30 tablet 3 Taking  . tiotropium (SPIRIVA HANDIHALER) 18 MCG inhalation capsule INHALE ONE CAPSULE ONCE DAILY 30 capsule 6 Past Week at Unknown time  . traMADol (ULTRAM) 50 MG tablet Take 100 mg by mouth 2 (two) times daily.    10/30/2015 at 0500  . warfarin (COUMADIN) 5 MG tablet TAKE 1 TABLET DAILY OR AS DIRECTED BY THE COUMADIN CLINIC (Patient taking differently: TAKE 1 TABLET DAILY OR AS DIRECTED BY THE COUMADIN CLINIC    (alternate from 2.5mg  & 5mg . )- after dinner) 105 tablet 3 10/26/2015  . enoxaparin (LOVENOX) 80 MG/0.8ML injection Inject 0.8 mLs (80 mg total) into the skin daily. 10 Syringe 1 10/28/2015   Scheduled:  . amLODipine  10 mg Oral QPM  . aspirin EC  81 mg Oral QHS  . atorvastatin  20 mg Oral q1800  . Chlorhexidine Gluconate Cloth  6 each Topical Daily  . docusate sodium  200 mg Oral Daily  . famotidine  20 mg Oral BID  . furosemide  20 mg Oral Daily  . levothyroxine  100 mcg Oral QAC breakfast  . metoprolol tartrate  12.5 mg Oral BID  . moving right along book   Does not apply Once  . mupirocin ointment  1 application Nasal BID  . sodium chloride flush  3 mL Intravenous Q12H  . tiotropium  18 mcg Inhalation Daily  . warfarin  5 mg Oral ONCE-1800  . Warfarin - Pharmacist Dosing Inpatient   Does not apply q1800   Infusions:  . heparin 1,000 Units/hr (10/31/15 1600)    Assessment: 70yo female admitted for TAVR w/ severe aortic stenosis and anemia, now cleared by cards to resume Coumadin and start heparin for hypercoagulable state.  Heparin running at 1000 units/hr with low level at 0.16  Goal of Therapy:  INR 2-3 Heparin level 0.3-0.5 units/ml Monitor platelets by anticoagulation protocol: Yes   Plan:  Bolus heparin 2000 units,  then increase dose to 1200 units/hr Repeat HL at 0100 Daily HL, CBC Monitor for s/sx  of bleeding  Levester Fresh, PharmD, BCPS, [facility] Clinical Pharmacist Pager [phone] 10/31/2015 4:48 PM

## 2015-10-31 NOTE — Progress Notes (Signed)
1 Day Post-Op Procedure(s) (LRB): TRANSCATHETER AORTIC VALVE REPLACEMENT, TRANSFEMORAL (N/A) TRANSESOPHAGEAL ECHOCARDIOGRAM (TEE) (N/A) Subjective: No complaints  Objective: Vital signs in last 24 hours: Temp:  [97.9 F (36.6 C)-99.5 F (37.5 C)] 98.5 F (36.9 C) (02/08 0812) Pulse Rate:  [76-120] 94 (02/08 0700) Cardiac Rhythm:  [-] Normal sinus rhythm;Sinus tachycardia (02/08 0748) Resp:  [12-26] 17 (02/08 0700) BP: (102-154)/(46-97) 118/90 mmHg (02/08 0600) SpO2:  [94 %-100 %] 95 % (02/08 0700) Arterial Line BP: (119-172)/(43-72) 121/43 mmHg (02/08 0700) FiO2 (%):  [40 %-50 %] 40 % (02/07 1556) Weight:  [73.211 kg (161 lb 6.4 oz)] 73.211 kg (161 lb 6.4 oz) (02/08 0415)  Hemodynamic parameters for last 24 hours: PAP: (37-61)/(20-37) 44/27 mmHg CO:  [4.4 L/min-8.5 L/min] 4.4 L/min CI:  [2.5 L/min/m2-4.9 L/min/m2] 2.5 L/min/m2  Intake/Output from previous day: 02/07 0701 - 02/08 0700 In: 3445 [I.V.:2375; Blood:670; IV Piggyback:400] Out: 790 [Urine:790] Intake/Output this shift:    General appearance: alert and cooperative Neurologic: intact Heart: regular rate and rhythm, S1, S2 normal, no murmur, click, rub or gallop Lungs: clear to auscultation bilaterally Extremities: extremities normal, atraumatic, no cyanosis or edema, both feet pink and warm, right dp palpable. Wound: right groin incision ok  Lab Results:  Recent Labs  10/30/15 1438  10/30/15 2145 10/31/15 0405  WBC 9.6  --   --  9.5  HGB 6.3*  < > 9.4* 9.0*  HCT 21.7*  < > 30.9* 29.9*  PLT 248  --   --  201  < > = values in this interval not displayed. BMET:  Recent Labs  10/30/15 1343 10/30/15 1441 10/31/15 0405  NA 138 136 137  K 4.4 4.3 4.1  CL 106  --  106  CO2  --   --  22  GLUCOSE 111* 104* 107*  BUN 30*  --  28*  CREATININE 1.70*  --  1.93*  CALCIUM  --   --  9.1    PT/INR:  Recent Labs  10/30/15 1438  LABPROT 18.1*  INR 1.49   ABG    Component Value Date/Time   PHART 7.343*  10/30/2015 1735   HCO3 23.2 10/30/2015 1735   TCO2 24 10/30/2015 1735   ACIDBASEDEF 2.0 10/30/2015 1735   O2SAT 97.0 10/30/2015 1735   CBG (last 3)   Recent Labs  10/30/15 1941 10/31/15 0026 10/31/15 0353  GLUCAP 94 108* 97   CXR: ok  Assessment/Plan: S/P Procedure(s) (LRB): TRANSCATHETER AORTIC VALVE REPLACEMENT, TRANSFEMORAL (N/A) TRANSESOPHAGEAL ECHOCARDIOGRAM (TEE) (N/A)  POD 1   Hemodynamically stable in sinus rhythm.  Remove swan, sleeve and arterial line, foley  APL antibody syndrome with hypercoagulability: to start heparin and coumadin today.  Stage 3 CKD: creat stable. Repeat tomorrow.  Chronic anemia: felt to be iron deficiency by her hematologist. Does not tolerate po iron. Consider IV.  Echo today.  Mobilize and plan transfer to 2W today.   LOS: 1 day    Gaye Pollack 10/31/2015

## 2015-10-31 NOTE — Progress Notes (Signed)
ANTICOAGULATION CONSULT NOTE - Initial Consult  Pharmacy Consult for heparin and warfarin Indication: hypercoagulable  Allergies  Allergen Reactions  . Allopurinol Itching, Nausea Only, Rash and Other (See Comments)    Top of head to tip of toes with rash; aches all over  . Cephalexin Other (See Comments)    Severe case of thrush in mouth/tongue.  Tongue raw and hard and swollen.  . Nitrofurantoin Hives, Itching, Nausea And Vomiting, Rash and Other (See Comments)    Body aches, like I have the flu  . Ace Inhibitors Other (See Comments)    cough  . Sulfonamide Derivatives Hives, Itching and Rash    Patient Measurements: Height: 5\' 3"  (160 cm) Weight: 161 lb 6.4 oz (73.211 kg) IBW/kg (Calculated) : 52.4 Heparin Dosing Weight: 70kg  Vital Signs: Temp: 99.5 F (37.5 C) (02/08 0700) Temp Source: Core (Comment) (02/08 0400) BP: 118/90 mmHg (02/08 0600) Pulse Rate: 94 (02/08 0700)  Labs:  Recent Labs  10/30/15 0717  10/30/15 1250 10/30/15 1343 10/30/15 1438 10/30/15 1441 10/30/15 2145 10/31/15 0405  HGB  --   < > 6.5* 6.8* 6.3* 13.6 9.4* 9.0*  HCT  --   < > 19.0* 20.0* 21.7* 40.0 30.9* 29.9*  PLT  --   --   --   --  248  --   --  201  APTT 48*  --   --   --  43*  --   --   --   LABPROT 16.7*  --   --   --  18.1*  --   --   --   INR 1.34  --   --   --  1.49  --   --   --   CREATININE  --   < > 1.70* 1.70*  --   --   --  1.93*  < > = values in this interval not displayed.  Estimated Creatinine Clearance: 25.6 mL/min (by C-G formula based on Cr of 1.93).   Medical History: Past Medical History  Diagnosis Date  . Other and unspecified hyperlipidemia 401.9  . Hypothyroidism   . Obesity, unspecified   . Gout   . Low back pain     buldging disc ,and herniated disc  . Rheumatoid arthritis(714.0)   . Cough 11/12/2010  . Hypertension   . Coagulopathy ([place])   . Von Willebrand disease ([place])     Dr. Jana Hakim follows  . Aortic stenosis   . CAD (coronary artery disease)    . TIA (transient ischemic attack)   . Chronic diastolic (congestive) heart failure ([place])   . Chronic kidney disease   . 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 IDA (iron deficiency anemia) 06/23/2014  . Complication of anesthesia     slow awaken from anesthesia  . Pneumonia   . COPD (chronic obstructive pulmonary disease) ([facility])     home O2-3 liters/min.  Marland Kitchen Heart murmur   . Stroke ([place]) 2000    TIA  . Shortness of breath dyspnea   . Depression     relative to pain    Medications:  Prescriptions prior to admission  Medication Sig Dispense Refill Last Dose  . acetaminophen (TYLENOL ARTHRITIS PAIN) 650 MG CR tablet Take 650-1,300 mg by mouth 2 (two) times daily.   10/29/2015 at Unknown time  . amLODipine (NORVASC) 10 MG tablet Take 1 tablet (10 mg total) by mouth every evening. 90 tablet 4 10/30/2015 at 0500  . aspirin 81 MG tablet Take  81 mg by mouth at bedtime.    Past Week at Unknown time  . atorvastatin (LIPITOR) 20 MG tablet Take 1 tablet (20 mg total) by mouth daily. 90 tablet 3 10/29/2015 at Unknown time  . febuxostat (ULORIC) 40 MG tablet Take 40 mg by mouth every morning.    10/29/2015 at Unknown time  . furosemide (LASIX) 20 MG tablet Take 1 tablet (20 mg total) by mouth daily. 30 tablet 3 10/29/2015 at Unknown time  . levothyroxine (SYNTHROID, LEVOTHROID) 100 MCG tablet TAKE 1 TABLET DAILY 90 tablet 3 10/30/2015 at Unknown time  . losartan (COZAAR) 50 MG tablet Take 1 tablet (50 mg total) by mouth daily. 30 tablet 3 10/29/2015 at Unknown time  . metoprolol succinate (TOPROL-XL) 100 MG 24 hr tablet Take 1 tablet (100 mg total) by mouth 2 (two) times daily. Take with or immediately following a meal. 180 tablet 3 10/29/2015 at Unknown time  . Multiple Vitamin (MULTIVITAMIN) capsule Take 1 capsule by mouth daily at 12 noon.    Taking  . Omega 3 1200 MG CAPS Take 1,500 mg by mouth daily at 12 noon. 1200mg  softgel + 300mg  softgel.   Taking  . potassium chloride (K-DUR) 10 MEQ tablet  Take 1 tablet (10 mEq total) by mouth daily. 30 tablet 3 Taking  . tiotropium (SPIRIVA HANDIHALER) 18 MCG inhalation capsule INHALE ONE CAPSULE ONCE DAILY 30 capsule 6 Past Week at Unknown time  . traMADol (ULTRAM) 50 MG tablet Take 100 mg by mouth 2 (two) times daily.    10/30/2015 at 0500  . warfarin (COUMADIN) 5 MG tablet TAKE 1 TABLET DAILY OR AS DIRECTED BY THE COUMADIN CLINIC (Patient taking differently: TAKE 1 TABLET DAILY OR AS DIRECTED BY THE COUMADIN CLINIC    (alternate from 2.5mg  & 5mg . )- after dinner) 105 tablet 3 10/26/2015  . enoxaparin (LOVENOX) 80 MG/0.8ML injection Inject 0.8 mLs (80 mg total) into the skin daily. 10 Syringe 1 10/28/2015   Scheduled:  . acetaminophen  1,000 mg Oral 4 times per day   Or  . acetaminophen (TYLENOL) oral liquid 160 mg/5 mL  1,000 mg Per Tube 4 times per day  . amLODipine  10 mg Oral QPM  . antiseptic oral rinse  7 mL Mouth Rinse BID  . aspirin EC  81 mg Oral QHS  . atorvastatin  20 mg Oral q1800  . cefUROXime (ZINACEF)  IV  1.5 g Intravenous Q12H  . Chlorhexidine Gluconate Cloth  6 each Topical Daily  . famotidine (PEPCID) IV  20 mg Intravenous Q12H  . furosemide  20 mg Oral Daily  . insulin aspart  0-24 Units Subcutaneous 6 times per day  . levothyroxine  100 mcg Oral QAC breakfast  . losartan  50 mg Oral Daily  . mupirocin ointment  1 application Nasal BID  . [START ON 11/01/2015] pantoprazole  40 mg Oral Daily  . tiotropium  18 mcg Inhalation Daily   Infusions:  . dexmedetomidine Stopped (10/30/15 1615)  . lactated ringers 10 mL/hr at 10/30/15 [id number]  . nitroGLYCERIN    . phenylephrine (NEO-SYNEPHRINE) Adult infusion      Assessment: 70yo female admitted for TAVR w/ severe aortic stenosis and anemia, now cleared by cards to resume Coumadin and start heparin for hypercoagulable state.  Goal of Therapy:  INR 2-3 Heparin level 0.3-0.5 units/ml Monitor platelets by anticoagulation protocol: Yes   Plan:  Will start heparin gtt at 1000  units/hr and monitor heparin levels and CBC.  Will give  Coumadin 5mg  po x1 today and monitor INR for dose adjustments.  Wynona Neat, PharmD, BCPS  10/31/2015,7:36 AM

## 2015-10-31 NOTE — Progress Notes (Signed)
IV site Lt antecubital space, no longer oozing.  OK to resume heparin without bolus.  Monitor closely.  Pt agreeable and will call Nurse if it becomes pain full, tight or wet feeling.

## 2015-10-31 NOTE — Progress Notes (Signed)
     SUBJECTIVE: no complaints.   Tele: sinus  BP 118/90 mmHg  Pulse 94  Temp(Src) 99.5 F (37.5 C) (Core (Comment))  Resp 17  Ht 5\' 3"  (1.6 m)  Wt 161 lb 6.4 oz (73.211 kg)  BMI 28.60 kg/m2  SpO2 95%  Intake/Output Summary (Last 24 hours) at 10/31/15 0724 Last data filed at 10/31/15 0700  Gross per 24 hour  Intake 3395.03 ml  Output    790 ml  Net 2605.03 ml    PHYSICAL EXAM General: Well developed, well nourished, in no acute distress. Alert and oriented x 3.  Psych:  Good affect, responds appropriately Neck: No JVD. No masses noted.  Lungs: Clear bilaterally with no wheezes or rhonci noted.  Heart: RRR with soft systolic murmur.  Abdomen: Bowel sounds are present. Soft, non-tender.  Extremities: No lower extremity edema.   LABS: Basic Metabolic Panel:  Recent Labs  10/30/15 1343 10/30/15 1441 10/31/15 0405  NA 138 136 137  K 4.4 4.3 4.1  CL 106  --  106  CO2  --   --  22  GLUCOSE 111* 104* 107*  BUN 30*  --  28*  CREATININE 1.70*  --  1.93*  CALCIUM  --   --  9.1  MG  --   --  2.8*   CBC:  Recent Labs  10/30/15 1438  10/30/15 2145 10/31/15 0405  WBC 9.6  --   --  9.5  HGB 6.3*  < > 9.4* 9.0*  HCT 21.7*  < > 30.9* 29.9*  MCV 77.2*  --   --  79.5  PLT 248  --   --  201  < > = values in this interval not displayed.  Current Meds: . acetaminophen  1,000 mg Oral 4 times per day   Or  . acetaminophen (TYLENOL) oral liquid 160 mg/5 mL  1,000 mg Per Tube 4 times per day  . amLODipine  10 mg Oral QPM  . antiseptic oral rinse  7 mL Mouth Rinse BID  . aspirin EC  81 mg Oral QHS  . atorvastatin  20 mg Oral q1800  . cefUROXime (ZINACEF)  IV  1.5 g Intravenous Q12H  . Chlorhexidine Gluconate Cloth  6 each Topical Daily  . famotidine (PEPCID) IV  20 mg Intravenous Q12H  . furosemide  20 mg Oral Daily  . insulin aspart  0-24 Units Subcutaneous 6 times per day  . levothyroxine  100 mcg Oral QAC breakfast  . losartan  50 mg Oral Daily  . mupirocin  ointment  1 application Nasal BID  . [START ON 11/01/2015] pantoprazole  40 mg Oral Daily  . tiotropium  18 mcg Inhalation Daily     ASSESSMENT AND PLAN:  1. Severe aortic valve stenosis: POD #1 s/p TAVR. Doing well this am. ASA this am. Will restart coumadin today with heparin bridge. Echo today.    2. Post-op anemia: baseline anemia worsened post procedure. S/p 2 units pRBCs. H/H stable this am. Repeat tomorrow.   3. Hypercoagulable state. Will start IV heparin this am. Restart coumadin. Once we establish that she has no bleeding, will transition to Lovenox. She will need bridging until INR is therapeutic.   D/c lines. Will be able to go to 2W today if able to get out of bed.   MCALHANY,CHRISTOPHER  2/8/20177:24 AM

## 2015-11-01 ENCOUNTER — Other Ambulatory Visit: Payer: Self-pay | Admitting: *Deleted

## 2015-11-01 DIAGNOSIS — D509 Iron deficiency anemia, unspecified: Secondary | ICD-10-CM

## 2015-11-01 DIAGNOSIS — I350 Nonrheumatic aortic (valve) stenosis: Secondary | ICD-10-CM

## 2015-11-01 DIAGNOSIS — R000 Tachycardia, unspecified: Secondary | ICD-10-CM

## 2015-11-01 LAB — CBC
HCT: 28.7 % — ABNORMAL LOW (ref 36.0–46.0)
Hemoglobin: 8.7 g/dL — ABNORMAL LOW (ref 12.0–15.0)
MCH: 23.8 pg — AB (ref 26.0–34.0)
MCHC: 30.3 g/dL (ref 30.0–36.0)
MCV: 78.6 fL (ref 78.0–100.0)
PLATELETS: 162 10*3/uL (ref 150–400)
RBC: 3.65 MIL/uL — AB (ref 3.87–5.11)
RDW: 17.3 % — ABNORMAL HIGH (ref 11.5–15.5)
WBC: 12.5 10*3/uL — ABNORMAL HIGH (ref 4.0–10.5)

## 2015-11-01 LAB — BASIC METABOLIC PANEL
Anion gap: 7 (ref 5–15)
BUN: 31 mg/dL — AB (ref 6–20)
CHLORIDE: 109 mmol/L (ref 101–111)
CO2: 21 mmol/L — AB (ref 22–32)
CREATININE: 2.16 mg/dL — AB (ref 0.44–1.00)
Calcium: 9.1 mg/dL (ref 8.9–10.3)
GFR calc non Af Amer: 22 mL/min — ABNORMAL LOW (ref >60)
GFR, EST AFRICAN AMERICAN: 25 mL/min — AB (ref >60)
Glucose, Bld: 128 mg/dL — ABNORMAL HIGH (ref 65–99)
Potassium: 4.4 mmol/L (ref 3.5–5.1)
Sodium: 137 mmol/L (ref 135–145)

## 2015-11-01 LAB — HEPARIN LEVEL (UNFRACTIONATED)
HEPARIN UNFRACTIONATED: 0.59 [IU]/mL (ref 0.30–0.70)
Heparin Unfractionated: 0.47 IU/mL (ref 0.30–0.70)

## 2015-11-01 LAB — PROTIME-INR
INR: 1.45 (ref 0.00–1.49)
Prothrombin Time: 17.7 seconds — ABNORMAL HIGH (ref 11.6–15.2)

## 2015-11-01 MED ORDER — METOPROLOL TARTRATE 25 MG PO TABS
25.0000 mg | ORAL_TABLET | Freq: Two times a day (BID) | ORAL | Status: DC
  Administered 2015-11-01 – 2015-11-02 (×3): 25 mg via ORAL
  Filled 2015-11-01 (×3): qty 1

## 2015-11-01 MED ORDER — FAMOTIDINE 20 MG PO TABS
20.0000 mg | ORAL_TABLET | Freq: Every day | ORAL | Status: DC
  Administered 2015-11-02: 20 mg via ORAL
  Filled 2015-11-01: qty 1

## 2015-11-01 MED ORDER — WARFARIN SODIUM 5 MG PO TABS
5.0000 mg | ORAL_TABLET | Freq: Once | ORAL | Status: AC
  Administered 2015-11-01: 5 mg via ORAL
  Filled 2015-11-01: qty 1

## 2015-11-01 NOTE — Progress Notes (Signed)
Patient Name: Karen Chambers Date of Encounter: 11/01/2015   SUBJECTIVE Feeling well. No chest pain, sob or palpitations.   CURRENT MEDS . amLODipine  10 mg Oral QPM  . aspirin EC  81 mg Oral QHS  . atorvastatin  20 mg Oral q1800  . Chlorhexidine Gluconate Cloth  6 each Topical Daily  . docusate sodium  200 mg Oral Daily  . famotidine  20 mg Oral BID  . furosemide  20 mg Oral Daily  . levothyroxine  100 mcg Oral QAC breakfast  . metoprolol tartrate  25 mg Oral BID  . moving right along book   Does not apply Once  . mupirocin ointment  1 application Nasal BID  . sodium chloride flush  3 mL Intravenous Q12H  . tiotropium  18 mcg Inhalation Daily  . warfarin  5 mg Oral ONCE-1800  . Warfarin - Pharmacist Dosing Inpatient   Does not apply q1800    OBJECTIVE  Filed Vitals:   10/31/15 1650 10/31/15 2127 11/01/15 0559 11/01/15 0700  BP: 164/61 138/61 139/63   Pulse: 95 102 110   Temp: 98.7 F (37.1 C) 98.7 F (37.1 C) 98.2 F (36.8 C)   TempSrc: Oral Oral Oral   Resp: 20 18    Height:      Weight:    160 lb 6.4 oz (72.757 kg)  SpO2: 97% 99% 95%     Intake/Output Summary (Last 24 hours) at 11/01/15 1148 Last data filed at 11/01/15 0848  Gross per 24 hour  Intake    290 ml  Output    450 ml  Net   -160 ml   Filed Weights   10/31/15 0415 11/01/15 0700  Weight: 161 lb 6.4 oz (73.211 kg) 160 lb 6.4 oz (72.757 kg)    PHYSICAL EXAM  General: Pleasant, NAD. Neuro: Alert and oriented X 3. Moves all extremities spontaneously. Psych: Normal affect. HEENT:  Normal  Neck: Supple without bruits or JVD. Lungs:  Resp regular and unlabored, CTA. Heart: RRR no s3, s4. Soft systolic murmurs. Abdomen: Soft, non-tender, non-distended, BS + x 4.  Extremities: No clubbing, cyanosis or edema. DP/PT/Radials 2+ and equal bilaterally.  Accessory Clinical Findings  CBC  Recent Labs  10/31/15 0405 11/01/15 0055  WBC 9.5 12.5*  HGB 9.0* 8.7*  HCT 29.9* 28.7*  MCV 79.5 78.6    PLT 201 0000000   Basic Metabolic Panel  Recent Labs  10/31/15 0405 11/01/15 0055  NA 137 137  K 4.1 4.4  CL 106 109  CO2 22 21*  GLUCOSE 107* 128*  BUN 28* 31*  CREATININE 1.93* 2.16*  CALCIUM 9.1 9.1  MG 2.8*  --     TELE  Sinus rhythm Radiology/Studies  Dg Chest 2 View  10/26/2015  CLINICAL DATA:  Severe aortic stenosis, preop chest exam EXAM: CHEST  2 VIEW COMPARISON:  CT chest 10/12/2015 FINDINGS: The lungs are hyperinflated likely secondary to COPD. There is no focal parenchymal opacity. There is no pleural effusion or pneumothorax. There is stable cardiomegaly. There is thoracic aortic atherosclerosis. The osseous structures are unremarkable. IMPRESSION: No active cardiopulmonary disease. Electronically Signed   By: Kathreen Devoid   On: 10/26/2015 11:32   Dg Chest Port 1 View  10/31/2015  CLINICAL DATA:  TAVR EXAM: PORTABLE CHEST 1 VIEW COMPARISON:  10/30/2015 FINDINGS: TAVR remains in good position and unchanged. Endotracheal tube removed. Swan-Ganz catheter tip in the main pulmonary artery. No pneumothorax Progression of vascular congestion and small effusions  suggesting fluid overload. Mild bibasilar atelectasis. IMPRESSION: Mild progression of vascular congestion and interstitial edema. Mild increase in bilateral pleural effusions. Electronically Signed   By: Franchot Gallo M.D.   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 Initial encounter. EXAM: PORTABLE CHEST 1 VIEW COMPARISON:  CT chest 10/12/2015.  PA and lateral chest 10/26/2015. FINDINGS: Endotracheal tube is in place with the tip in good position just below the clavicular heads. NG tube courses into the stomach and below the inferior margin of the film. Right IJ approach Swan-Ganz catheter tip is in the pulmonary outflow tract. There is a small left pleural effusion and basilar atelectasis. Minimal right basilar atelectasis is seen. No  pneumothorax. The lungs are emphysematous. IMPRESSION: Support tubes and lines project in good position. Negative for pneumothorax. Small left pleural effusion with basilar atelectasis. Emphysema. Electronically Signed   By: Inge Rise M.D.   On: 10/30/2015 14:52   Ct Angio Chest Aorta W/cm &/or Wo/cm  10/12/2015  CLINICAL DATA:  70 year old female with history of severe aortic stenosis. Preprocedural study prior to potential transcatheter aortic valve replacement (TAVR). EXAM: CT ANGIOGRAPHY CHEST, ABDOMEN AND PELVIS TECHNIQUE: Multidetector CT imaging through the chest, abdomen and pelvis was performed using the standard protocol during bolus administration of intravenous contrast. Multiplanar reconstructed images and MIPs were obtained and reviewed to evaluate the vascular anatomy. CONTRAST:  30mL OMNIPAQUE IOHEXOL 350 MG/ML SOLN COMPARISON:  Cardiac CTA 09/28/2015.  Chest CT 05/27/2005. FINDINGS: CTA CHEST FINDINGS Mediastinum/Lymph Nodes: Heart size is mildly enlarged. There is no significant pericardial fluid, thickening or pericardial calcification. There is atherosclerosis of the thoracic aorta, the great vessels of the mediastinum and the coronary arteries, including calcified atherosclerotic plaque in the left main, left anterior descending, left circumflex and right coronary arteries. Severe thickening calcification of the aortic valve, mitral valve and mitral annulus. Multiple borderline enlarged and mildly enlarged mediastinal and bilateral hilar lymph nodes, measuring up to 11 mm in short axis in the AP window nodal station. Esophagus is unremarkable in appearance. No axillary lymphadenopathy. Lungs/Pleura: 7 x 5 mm nodule in the left upper lobe again noted (image 33 of series 8), which is only slightly larger than remote prior study 05/27/2005, strongly favored to be benign. 10 x 9 mm pleural-based nodule in the medial aspect of the apex of the right upper lobe (image 16 of series 8), new  compared to prior study 05/27/2005. 7 mm subpleural nodule in the periphery of the lateral segment of the right middle lobe (image 100 of series 8), unchanged compared to 05/27/2005, considered benign, presumably a subpleural lymph node. Small thick-walled cavitary area measuring approximately 1.4 x 0.8 cm in the periphery of the right lower lobe (image 118 of series 8) immediately above the right hemidiaphragm, with a small amount of surrounding ground-glass attenuation and septal thickening. Small calcified granuloma in the right lower lobe. Mild diffuse ground-glass attenuation and septal thickening in the lungs bilaterally, suggesting a background of mild interstitial pulmonary edema. Mild diffuse bronchial wall thickening with mild centrilobular and paraseptal emphysema. Musculoskeletal/Soft Tissues: There are no aggressive appearing lytic or blastic lesions noted in the visualized portions of the skeleton. CTA ABDOMEN AND PELVIS FINDINGS Hepatobiliary: A few scattered low-attenuation lesions are noted in the liver, compatible with simple cysts, the largest of which measures 11 mm in segment 4 day. No other suspicious hepatic lesions are noted. No intra or extrahepatic  biliary ductal dilatation. Gallbladder is presumably surgically absent (no surgical clips are noted in the gallbladder fossa). Pancreas: No pancreatic mass. No pancreatic ductal dilatation. No pancreatic or peripancreatic fluid or inflammatory changes. Spleen: Unremarkable. Adrenals/Urinary Tract: Severe atrophy of the right kidney. Multifocal cortical thinning in the kidneys bilaterally, presumably areas of chronic post infectious or inflammatory scarring. Multiple low-attenuation lesions in the kidneys bilaterally, largest of which are compatible with simple cysts, which measure up to 1.5 cm in diameter in the lateral aspect of the interpolar region of the right kidney. Multiple other smaller sub cm low-attenuation lesions in the kidneys  bilaterally are too small to definitively characterize. Mild irregularity of the adrenal glands, without definite dominant nodule. No hydroureteronephrosis. Urinary bladder is normal in appearance. Stomach/Bowel: Normal appearance of the stomach. No pathologic dilatation of small bowel or colon. Appendix is not identified, likely surgically absent. Regardless, there are no inflammatory changes adjacent to the cecum to suggest presence of an acute appendicitis at this time. Vascular/Lymphatic: Extensive atherosclerosis throughout the abdominal and pelvic vasculature, with vascular findings and measurements pertinent to potential TAVR procedure, as detailed below. No aneurysm or dissection identified. No lymphadenopathy noted in the abdomen or pelvis. Reproductive: Status post hysterectomy. Ovaries are not confidently identified may be surgically absent or atrophic. Other: No significant volume of ascites.  No pneumoperitoneum. Musculoskeletal: Multiple small well-defined sclerotic lesions with narrow zones of transition are noted throughout the visualized axial and appendicular skeleton, predominantly in the bony pelvis and lower lumbar spine, favored to represent bone islands. There are no other aggressive appearing lytic or blastic lesions noted in the visualized portions of the skeleton. VASCULAR MEASUREMENTS PERTINENT TO TAVR: AORTA: Minimal Aortic Diameter -  11 x 11 mm Severity of Aortic Calcification -  severe RIGHT PELVIS: Right Common Iliac Artery - Minimal Diameter - 6.1 x 6.7 mm Tortuosity - mild to moderate Calcification - moderate to severe Right External Iliac Artery - Minimal Diameter - 6.7 x 6.4 mm Tortuosity - mild Calcification - mild Right Common Femoral Artery - Minimal Diameter - 5.7 x 7.0 mm Tortuosity - mild Calcification - mild to moderate LEFT PELVIS: Left Common Iliac Artery - Minimal Diameter - 5.8 x 8.0 mm Tortuosity - moderate Calcification - moderate to severe Left External Iliac Artery  - Minimal Diameter - 7.1 x 7.6 mm Tortuosity - mild Calcification - mild Left Common Femoral Artery - Minimal Diameter - 7.0 x 5.4 mm Tortuosity - mild Calcification - moderate Review of the MIP images confirms the above findings. IMPRESSION: 1. Vascular findings and measurements pertinent to potential TAVR procedure, as detailed above. This patient does appear to have suitable pelvic arterial access bilaterally. 2. Severe thickening calcification of the aortic valve, compatible with the patient's reported clinical history of severe aortic stenosis. There is also severe thickening calcification of the mitral valve and mitral annulus. 3. Multiple pulmonary nodules, largest of which is a pleural-based 1.0 x 0.9 cm nodule in the medial aspect of the apex of the right upper lobe. This could represent an area of post infectious or inflammatory scarring, however, neoplasm is not excluded. In addition, there is a thick-walled cavitary area in the periphery of the right lower lobe measuring 1.4 x 0.8 cm (image 118 of series 8), which is favored to be post infectious/inflammatory. Attention on repeat chest CT is suggested in the next 2-3 months to ensure the stability or resolution of these findings. 4. In addition, there is mediastinal and bilateral hilar lymphadenopathy, similar to  the recent prior examination. Clinical correlation for signs and symptoms of lymphoproliferative disorder is recommended. However, given the evidence for mild interstitial pulmonary edema in the lungs, the low-level lymphadenopathy may simply be related to underlying congestive heart failure. 5. Mild cardiomegaly. 6. Atherosclerosis, including left main and 3 vessel coronary artery disease. Assessment for potential risk factor modification, dietary therapy or pharmacologic therapy may be warranted, if clinically indicated. 7. Mild diffuse bronchial wall thickening with mild centrilobular and paraseptal emphysema. 8. Additional incidental  findings, as above. Electronically Signed   By: Vinnie Langton M.D.   On: 10/12/2015 17:42   Ct Angio Abd/pel W/ And/or W/o  10/12/2015  CLINICAL DATA:  70 year old female with history of severe aortic stenosis. Preprocedural study prior to potential transcatheter aortic valve replacement (TAVR). EXAM: CT ANGIOGRAPHY CHEST, ABDOMEN AND PELVIS TECHNIQUE: Multidetector CT imaging through the chest, abdomen and pelvis was performed using the standard protocol during bolus administration of intravenous contrast. Multiplanar reconstructed images and MIPs were obtained and reviewed to evaluate the vascular anatomy. CONTRAST:  84mL OMNIPAQUE IOHEXOL 350 MG/ML SOLN COMPARISON:  Cardiac CTA 09/28/2015.  Chest CT 05/27/2005. FINDINGS: CTA CHEST FINDINGS Mediastinum/Lymph Nodes: Heart size is mildly enlarged. There is no significant pericardial fluid, thickening or pericardial calcification. There is atherosclerosis of the thoracic aorta, the great vessels of the mediastinum and the coronary arteries, including calcified atherosclerotic plaque in the left main, left anterior descending, left circumflex and right coronary arteries. Severe thickening calcification of the aortic valve, mitral valve and mitral annulus. Multiple borderline enlarged and mildly enlarged mediastinal and bilateral hilar lymph nodes, measuring up to 11 mm in short axis in the AP window nodal station. Esophagus is unremarkable in appearance. No axillary lymphadenopathy. Lungs/Pleura: 7 x 5 mm nodule in the left upper lobe again noted (image 33 of series 8), which is only slightly larger than remote prior study 05/27/2005, strongly favored to be benign. 10 x 9 mm pleural-based nodule in the medial aspect of the apex of the right upper lobe (image 16 of series 8), new compared to prior study 05/27/2005. 7 mm subpleural nodule in the periphery of the lateral segment of the right middle lobe (image 100 of series 8), unchanged compared to 05/27/2005,  considered benign, presumably a subpleural lymph node. Small thick-walled cavitary area measuring approximately 1.4 x 0.8 cm in the periphery of the right lower lobe (image 118 of series 8) immediately above the right hemidiaphragm, with a small amount of surrounding ground-glass attenuation and septal thickening. Small calcified granuloma in the right lower lobe. Mild diffuse ground-glass attenuation and septal thickening in the lungs bilaterally, suggesting a background of mild interstitial pulmonary edema. Mild diffuse bronchial wall thickening with mild centrilobular and paraseptal emphysema. Musculoskeletal/Soft Tissues: There are no aggressive appearing lytic or blastic lesions noted in the visualized portions of the skeleton. CTA ABDOMEN AND PELVIS FINDINGS Hepatobiliary: A few scattered low-attenuation lesions are noted in the liver, compatible with simple cysts, the largest of which measures 11 mm in segment 4 day. No other suspicious hepatic lesions are noted. No intra or extrahepatic biliary ductal dilatation. Gallbladder is presumably surgically absent (no surgical clips are noted in the gallbladder fossa). Pancreas: No pancreatic mass. No pancreatic ductal dilatation. No pancreatic or peripancreatic fluid or inflammatory changes. Spleen: Unremarkable. Adrenals/Urinary Tract: Severe atrophy of the right kidney. Multifocal cortical thinning in the kidneys bilaterally, presumably areas of chronic post infectious or inflammatory scarring. Multiple low-attenuation lesions in the kidneys bilaterally, largest of which are compatible  with simple cysts, which measure up to 1.5 cm in diameter in the lateral aspect of the interpolar region of the right kidney. Multiple other smaller sub cm low-attenuation lesions in the kidneys bilaterally are too small to definitively characterize. Mild irregularity of the adrenal glands, without definite dominant nodule. No hydroureteronephrosis. Urinary bladder is normal in  appearance. Stomach/Bowel: Normal appearance of the stomach. No pathologic dilatation of small bowel or colon. Appendix is not identified, likely surgically absent. Regardless, there are no inflammatory changes adjacent to the cecum to suggest presence of an acute appendicitis at this time. Vascular/Lymphatic: Extensive atherosclerosis throughout the abdominal and pelvic vasculature, with vascular findings and measurements pertinent to potential TAVR procedure, as detailed below. No aneurysm or dissection identified. No lymphadenopathy noted in the abdomen or pelvis. Reproductive: Status post hysterectomy. Ovaries are not confidently identified may be surgically absent or atrophic. Other: No significant volume of ascites.  No pneumoperitoneum. Musculoskeletal: Multiple small well-defined sclerotic lesions with narrow zones of transition are noted throughout the visualized axial and appendicular skeleton, predominantly in the bony pelvis and lower lumbar spine, favored to represent bone islands. There are no other aggressive appearing lytic or blastic lesions noted in the visualized portions of the skeleton. VASCULAR MEASUREMENTS PERTINENT TO TAVR: AORTA: Minimal Aortic Diameter -  11 x 11 mm Severity of Aortic Calcification -  severe RIGHT PELVIS: Right Common Iliac Artery - Minimal Diameter - 6.1 x 6.7 mm Tortuosity - mild to moderate Calcification - moderate to severe Right External Iliac Artery - Minimal Diameter - 6.7 x 6.4 mm Tortuosity - mild Calcification - mild Right Common Femoral Artery - Minimal Diameter - 5.7 x 7.0 mm Tortuosity - mild Calcification - mild to moderate LEFT PELVIS: Left Common Iliac Artery - Minimal Diameter - 5.8 x 8.0 mm Tortuosity - moderate Calcification - moderate to severe Left External Iliac Artery - Minimal Diameter - 7.1 x 7.6 mm Tortuosity - mild Calcification - mild Left Common Femoral Artery - Minimal Diameter - 7.0 x 5.4 mm Tortuosity - mild Calcification - moderate Review of  the MIP images confirms the above findings. IMPRESSION: 1. Vascular findings and measurements pertinent to potential TAVR procedure, as detailed above. This patient does appear to have suitable pelvic arterial access bilaterally. 2. Severe thickening calcification of the aortic valve, compatible with the patient's reported clinical history of severe aortic stenosis. There is also severe thickening calcification of the mitral valve and mitral annulus. 3. Multiple pulmonary nodules, largest of which is a pleural-based 1.0 x 0.9 cm nodule in the medial aspect of the apex of the right upper lobe. This could represent an area of post infectious or inflammatory scarring, however, neoplasm is not excluded. In addition, there is a thick-walled cavitary area in the periphery of the right lower lobe measuring 1.4 x 0.8 cm (image 118 of series 8), which is favored to be post infectious/inflammatory. Attention on repeat chest CT is suggested in the next 2-3 months to ensure the stability or resolution of these findings. 4. In addition, there is mediastinal and bilateral hilar lymphadenopathy, similar to the recent prior examination. Clinical correlation for signs and symptoms of lymphoproliferative disorder is recommended. However, given the evidence for mild interstitial pulmonary edema in the lungs, the low-level lymphadenopathy may simply be related to underlying congestive heart failure. 5. Mild cardiomegaly. 6. Atherosclerosis, including left main and 3 vessel coronary artery disease. Assessment for potential risk factor modification, dietary therapy or pharmacologic therapy may be warranted, if clinically indicated. 7.  Mild diffuse bronchial wall thickening with mild centrilobular and paraseptal emphysema. 8. Additional incidental findings, as above. Electronically Signed   By: Vinnie Langton M.D.   On: 10/12/2015 17:42    Echo 10/31/15 LV EF: 65% -   70%  ------------------------------------------------------------------- History:  PMH: s/p TAVR. Dyspnea. Chronic obstructive pulmonary disease. PMH: TAVR. Stroke. Transient ischemic attack. Risk factors: Hypertension.  ------------------------------------------------------------------- Study Conclusions  - Left ventricle: The cavity size was normal. There was severe focal basal hypertrophy of the septum. Systolic function was vigorous. The estimated ejection fraction was in the range of 65% to 70%. Wall motion was normal; there were no regional wall motion abnormalities. Doppler parameters are consistent with abnormal left ventricular relaxation (grade 1 diastolic dysfunction). - Aortic valve: TAVR - Edwards Sapien 3 THV (23 mm) There was moderate regurgitation directed eccentrically in the LVOT. Peak velocity (S): 373 cm/s. Mean gradient (S): 26 mm Hg. Peak velocity is higher than expected post TAVR. - Mitral valve: Moderately calcified annulus. Mildly thickened, moderately calcified leaflets . Mobility was restricted. The findings are consistent with moderate stenosis. Mean gradient (D): 11 mm Hg. - Left atrium: The atrium was mildly dilated. - Pulmonary arteries: Systolic pressure was mildly increased. PA peak pressure: 40 mm Hg (S).   ASSESSMENT AND PLAN  1. Severe aortic valve stenosis:  - POD #2 s/p TAVR.Echo showed Peak velocity (S): 373 cm/s. Mean gradient (S): 26 mm Hg. Peak velocity is higher than expected post TAVR. PA pressure of 40 mm HG.   2. Post-op anemia: - Baseline anemia worsened post procedure. S/p 2 units pRBCs. -H/H 8.7/28.7 today was 9/29.9 yesterday. Non tolerant to oral iron. Consider IV.   3. Hypercoagulable state: per pharmacy heparin to coumadin bridge.   4. CKD - Increased to 2.16. Baseline 2.3-2.5. Follow closely.   5. Sinus tachycardia - increased metopolol to 17mb BID this morning. Was on Troprol XL  100mg  at home. Titrate further as needed.   Dispo: will get PT eval as per cardiac rehab recommendation for eval of gait instability.   Signed, Bhagat,Bhavinkumar PA-C Pager [phone]  I have personally seen and examined this patient with Mr. [place]. I agree with the assessment and plan as outlined above. She is doing well today. She has slight confusion per family but she is now alert and oriented x 3. I have reviewed her echo images. She has mild peri-valvular AI, unchanged from the TEE in the OR. The additional noise in the LVOT is likely from her MS. She also has basal septal hypertrophy which may be making her gradient across the new valve seem higher. Will continue IV heparin today and start Lovenox in the am. She will need a therapeutic INR before Lovenox is stopped. Check CBC in am. Recheck renal function in am.   MCALHANY,CHRISTOPHER 11/01/2015 1:07 PM

## 2015-11-01 NOTE — Progress Notes (Signed)
ANTICOAGULATION CONSULT NOTE - Follow Up Consult  Pharmacy Consult for Heparin > Coumadin Indication: APLA syndrome - hypercoag state  Allergies  Allergen Reactions  . Allopurinol Itching, Nausea Only, Rash and Other (See Comments)    Top of head to tip of toes with rash; aches all over  . Cephalexin Other (See Comments)    Severe case of thrush in mouth/tongue.  Tongue raw and hard and swollen.  . Nitrofurantoin Hives, Itching, Nausea And Vomiting, Rash and Other (See Comments)    Body aches, like I have the flu  . Ace Inhibitors Other (See Comments)    cough  . Sulfonamide Derivatives Hives, Itching and Rash    Patient Measurements: Height: 5\' 3"  (160 cm) Weight: 160 lb 6.4 oz (72.757 kg) IBW/kg (Calculated) : 52.4 Heparin Dosing Weight: 67 kg  Vital Signs: Temp: 99.1 F (37.3 C) (02/09 1310) Temp Source: Oral (02/09 1310) BP: 132/55 mmHg (02/09 1310) Pulse Rate: 91 (02/09 1310)  Labs:  Recent Labs  10/30/15 0717  10/30/15 1343 10/30/15 1438  10/30/15 2145 10/31/15 0405 10/31/15 1549 11/01/15 0055 11/01/15 0529 11/01/15 1251  HGB  --   < > 6.8* 6.3*  < > 9.4* 9.0*  --  8.7*  --   --   HCT  --   < > 20.0* 21.7*  < > 30.9* 29.9*  --  28.7*  --   --   PLT  --   --   --  248  --   --  201  --  162  --   --   APTT 48*  --   --  43*  --   --   --   --   --   --   --   LABPROT 16.7*  --   --  18.1*  --   --   --   --  17.7*  --   --   INR 1.34  --   --  1.49  --   --   --   --  1.45  --   --   HEPARINUNFRC  --   --   --   --   --   --   --  0.16*  --  0.47 0.59  CREATININE  --   < > 1.70*  --   --   --  1.93*  --  2.16*  --   --   < > = values in this interval not displayed.  Estimated Creatinine Clearance: 22.9 mL/min (by C-G formula based on Cr of 2.16).   Medications:  Scheduled:  . amLODipine  10 mg Oral QPM  . aspirin EC  81 mg Oral QHS  . atorvastatin  20 mg Oral q1800  . Chlorhexidine Gluconate Cloth  6 each Topical Daily  . docusate sodium  200 mg  Oral Daily  . [START ON 11/02/2015] famotidine  20 mg Oral Daily  . furosemide  20 mg Oral Daily  . levothyroxine  100 mcg Oral QAC breakfast  . metoprolol tartrate  25 mg Oral BID  . moving right along book   Does not apply Once  . mupirocin ointment  1 application Nasal BID  . sodium chloride flush  3 mL Intravenous Q12H  . tiotropium  18 mcg Inhalation Daily  . warfarin  5 mg Oral ONCE-1800  . Warfarin - Pharmacist Dosing Inpatient   Does not apply q1800   Infusions:  . heparin 1,200 Units/hr (10/31/15 2018)  Assessment: 70 yo F s/p TAVR on 10/30/15.  Pt was started on heparin > Coumadin bridge on 2/8.  Coumadin dose was not given 2/8 PM as ordered.  INR is subtherapeutic as expected.  Heparin level is slightly above goal (reduced goal of 0.3-0.5 due to recent surgery).  No further bleeding noted.  Bleeding at IV site from last PM is resolved.  Goal of Therapy:  INR 2-3 Heparin level 0.3-0.5 units/ml Monitor platelets by anticoagulation protocol: Yes   Plan:  Decrease heparin to 1100 units/hr Coumadin 5 mg PO x 1 tonight. Daily heparin level , CBC, INR  [employer], Pharm.D., BCPS Clinical Pharmacist Pager [phone] 11/01/2015 3:25 PM

## 2015-11-01 NOTE — Progress Notes (Signed)
Patient resting quietly at this time. Had several incidents of confusion and trying to get out of bed. Bed alarm was on, no incidents or falls throughout the night.

## 2015-11-01 NOTE — Progress Notes (Signed)
CARDIAC REHAB PHASE I   PRE:  Rate/Rhythm: 101 ST  BP:  Sitting: 152/66        SaO2: 97 3.5 L  MODE:  Ambulation:  Pt ambulated to door and back  POST:  Rate/Rhythm: 116 ST  BP:  Sitting: 152/84         SaO2: 92 3L  Pt states she has not walked much since surgery. RN states attempted to get pt to chair earlier with x2 assist and her knees buckled and she had to sit suddenly. Pt states she has a history of RA and her knees "give out sometimes", pt states she has not fallen at home. Pt unable to stand straight. Pt ambulated to door of room on 3L O2 with IV, rolling walker, gait belt, followed with wheelchair pushed by pt's son, stated her knees are going to give out, requested to sit. Pt also short of breath. Sats 93% on 3L. Pt sat for brief period, then was able to return to recliner in room via ambulation. Pt would benefit from PT consult to further assess mobility/safety. Cardiology PA aware. Pt seems to have very limited activity tolerance at this time. Will continue to follow as assist x2 for safety as pt is a fall risk. Encouraged IS. Pt to recliner after walk, call bell within reach.   [id number] Lenna Sciara, RN, BSN 11/01/2015 [phone]

## 2015-11-01 NOTE — Progress Notes (Signed)
Patient was sitting on side of the bed with the IV pulled out. Patient cleaned up, no pain, distress or needs expressed at this time. Bed alarm on call light within reach.Marland Kitchen

## 2015-11-01 NOTE — Evaluation (Signed)
Physical Therapy Evaluation Patient Details Name: Karen Chambers MRN: [id number] DOB: [birth year] Today's Date: 11/01/2015   History of Present Illness  Patient is a 70 y/o female with hx of RA, HTN, CAD, Von Willebrand disease, CVA, depression, COPD, CKD and CHF s/p TAVR.   Clinical Impression  Patient presents with generalized weakness esp in BLEs, balance deficits, decreased endurance and poor safety awareness impacting safe mobility and putting pt at increased risk for falls. Pt is not safe to return home at this time as pt requiring mod A for gait training and multiple LOB onto bed due to knees buckling unannounced. Not sure husband can care for pt at current level. Would benefit from ST SNF to maximize independence and mobility prior to return home.    Follow Up Recommendations SNF    Equipment Recommendations  None recommended by PT    Recommendations for Other Services OT consult     Precautions / Restrictions Precautions Precautions: Fall Restrictions Weight Bearing Restrictions: No      Mobility  Bed Mobility Overal bed mobility: Needs Assistance Bed Mobility: Supine to Sit;Sit to Supine     Supine to sit: Mod assist;HOB elevated Sit to supine: Min guard   General bed mobility comments: "pull me up" Assist to elevate trunk.   Transfers Overall transfer level: Needs assistance Equipment used: Rolling walker (2 wheeled) Transfers: Sit to/from Stand           General transfer comment: Despite cues for hand placement, pt pulling up on RW. Requires manual cues to place hands on armrests of surface. Pt with LOB x2 onto bed upon standing due to knees buckling. Not able to stand fully upright with forearms leaning on walker handles.   Ambulation/Gait Ambulation/Gait assistance: Mod assist Ambulation Distance (Feet): 6 Feet (+ 9') Assistive device: Rolling walker (2 wheeled) Gait Pattern/deviations: Step-through pattern;Step-to pattern;Decreased stride  length;Trunk flexed Gait velocity: decreased   General Gait Details: Pt flexed 90 degrees at hips/trunk with foreams leaning on RW during gait despite cues for upright posture and proper hand placement. Bil knee instability and buckling noted x3. Close chair follow. 3/4 DOE.   Stairs            Wheelchair Mobility    Modified Rankin (Stroke Patients Only)       Balance Overall balance assessment: Needs assistance Sitting-balance support: Feet supported;No upper extremity supported Sitting balance-Leahy Scale: Fair     Standing balance support: During functional activity Standing balance-Leahy Scale: Poor Standing balance comment: Reliant on BUEs for support and min A for balance due to unexpected LOB onto bed secondary to knee buckling.                             Pertinent Vitals/Pain Pain Assessment: No/denies pain    Home Living Family/patient expects to be discharged to:: Private residence Living Arrangements: Spouse/significant other Available Help at Discharge: Family;Available 24 hours/day Type of Home: House Home Access: Ramped entrance     Home Layout: One level Home Equipment: Walker - 4 wheels;Wheelchair - manual;Shower seat      Prior Function Level of Independence: Independent with assistive device(s)         Comments: Not sure pt or spouse are a good historian as they report pt is Mod I with ADls. Spouse cooks. Reports no falls. Wears 02 at night.     Hand Dominance        Extremity/Trunk Assessment  Upper Extremity Assessment: Defer to OT evaluation           Lower Extremity Assessment: Generalized weakness;RLE deficits/detail;LLE deficits/detail (Bil knee instability as pt buckling unexpectedly multiple times during session.)      Cervical / Trunk Assessment: Kyphotic  Communication   Communication: No difficulties  Cognition Arousal/Alertness: Lethargic Behavior During Therapy: Impulsive Overall Cognitive  Status: Impaired/Different from baseline Area of Impairment: Safety/judgement;Problem solving;Following commands       Following Commands: Follows one step commands inconsistently;Follows one step commands with increased time Safety/Judgement: Decreased awareness of safety;Decreased awareness of deficits   Problem Solving: Slow processing;Requires verbal cues General Comments: Requires repetition for cues and pt still with difficulty following sometimes requiring hand over hand to perform task.    General Comments General comments (skin integrity, edema, etc.): Spouse present in room during session. Intiially reports pt is moving very similar to home however after asking more questions, pt does not seem close to baseline. Discussed disposition options with family including ST SNf.    Exercises        Assessment/Plan    PT Assessment Patient needs continued PT services  PT Diagnosis Difficulty walking;Generalized weakness   PT Problem List Decreased strength;Cardiopulmonary status limiting activity;Decreased activity tolerance;Decreased balance;Decreased mobility;Decreased safety awareness;Decreased cognition;Decreased knowledge of use of DME  PT Treatment Interventions Balance training;Functional mobility training;Therapeutic activities;Therapeutic exercise;Patient/family education;Gait training;DME instruction   PT Goals (Current goals can be found in the Care Plan section) Acute Rehab PT Goals Patient Stated Goal: to go home PT Goal Formulation: With patient Time For Goal Achievement: 11/15/15 Potential to Achieve Goals: Fair    Frequency Min 3X/week   Barriers to discharge Decreased caregiver support Elderly spouse    Co-evaluation               End of Session Equipment Utilized During Treatment: Gait belt;Other (comment);Oxygen Activity Tolerance: Patient limited by fatigue Patient left: in bed;with call bell/phone within reach;with bed alarm set;with  nursing/sitter in room;with family/visitor present Nurse Communication: Mobility status         Time: [id number] PT Time Calculation (min) (ACUTE ONLY): 24 min   Charges:   PT Evaluation $PT Eval Moderate Complexity: 1 Procedure PT Treatments $Gait Training: 8-22 mins   PT G Codes:        Viviene Thurston A Ardean Melroy 11/01/2015, 4:23 PM ,[place], [place], DPT [phone]
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
## 2015-11-01 NOTE — Progress Notes (Signed)
ANTICOAGULATION CONSULT NOTE - Follow Up Consult  Pharmacy Consult for heparin Indication: hypercoagulable   Labs:  Recent Labs  10/30/15 0717  10/30/15 1343 10/30/15 1438  10/30/15 2145 10/31/15 0405 10/31/15 1549 11/01/15 0055 11/01/15 0529  HGB  --   < > 6.8* 6.3*  < > 9.4* 9.0*  --  8.7*  --   HCT  --   < > 20.0* 21.7*  < > 30.9* 29.9*  --  28.7*  --   PLT  --   --   --  248  --   --  201  --  162  --   APTT 48*  --   --  43*  --   --   --   --   --   --   LABPROT 16.7*  --   --  18.1*  --   --   --   --  17.7*  --   INR 1.34  --   --  1.49  --   --   --   --  1.45  --   HEPARINUNFRC  --   --   --   --   --   --   --  0.16*  --  0.47  CREATININE  --   < > 1.70*  --   --   --  1.93*  --  2.16*  --   < > = values in this interval not displayed.   Assessment/Plan:  70yo female therapeutic on heparin after rate change. Will continue gtt at current rate and confirm stable with additional level.   Wynona Neat, PharmD, BCPS  11/01/2015,6:57 AM

## 2015-11-02 ENCOUNTER — Telehealth: Payer: Self-pay | Admitting: *Deleted

## 2015-11-02 LAB — BASIC METABOLIC PANEL
Anion gap: 8 (ref 5–15)
BUN: 33 mg/dL — AB (ref 6–20)
CALCIUM: 9.5 mg/dL (ref 8.9–10.3)
CHLORIDE: 108 mmol/L (ref 101–111)
CO2: 23 mmol/L (ref 22–32)
CREATININE: 2.27 mg/dL — AB (ref 0.44–1.00)
GFR, EST AFRICAN AMERICAN: 24 mL/min — AB (ref >60)
GFR, EST NON AFRICAN AMERICAN: 21 mL/min — AB (ref >60)
Glucose, Bld: 107 mg/dL — ABNORMAL HIGH (ref 65–99)
Potassium: 4.6 mmol/L (ref 3.5–5.1)
SODIUM: 139 mmol/L (ref 135–145)

## 2015-11-02 LAB — CBC
HCT: 27.8 % — ABNORMAL LOW (ref 36.0–46.0)
Hemoglobin: 8.3 g/dL — ABNORMAL LOW (ref 12.0–15.0)
MCH: 24.2 pg — ABNORMAL LOW (ref 26.0–34.0)
MCHC: 29.9 g/dL — ABNORMAL LOW (ref 30.0–36.0)
MCV: 81 fL (ref 78.0–100.0)
PLATELETS: 166 10*3/uL (ref 150–400)
RBC: 3.43 MIL/uL — AB (ref 3.87–5.11)
RDW: 17.8 % — AB (ref 11.5–15.5)
WBC: 10.3 10*3/uL (ref 4.0–10.5)

## 2015-11-02 LAB — PROTIME-INR
INR: 1.41 (ref 0.00–1.49)
PROTHROMBIN TIME: 17.4 s — AB (ref 11.6–15.2)

## 2015-11-02 LAB — HEPARIN LEVEL (UNFRACTIONATED): HEPARIN UNFRACTIONATED: 0.25 [IU]/mL — AB (ref 0.30–0.70)

## 2015-11-02 MED ORDER — WARFARIN SODIUM 7.5 MG PO TABS: 7.5000 mg | ORAL_TABLET | Freq: Every day | ORAL | Status: DC

## 2015-11-02 MED ORDER — ENOXAPARIN SODIUM 80 MG/0.8ML ~~LOC~~ SOLN: 70.0000 mg | SUBCUTANEOUS | Status: DC

## 2015-11-02 MED ORDER — METOPROLOL TARTRATE 25 MG PO TABS: 25.0000 mg | ORAL_TABLET | Freq: Two times a day (BID) | ORAL | Status: DC

## 2015-11-02 MED ORDER — ENOXAPARIN SODIUM 80 MG/0.8ML ~~LOC~~ SOLN
70.0000 mg | Freq: Every day | SUBCUTANEOUS | Status: DC
  Administered 2015-11-02: 70 mg via SUBCUTANEOUS
  Filled 2015-11-02: qty 0.8

## 2015-11-02 MED ORDER — WARFARIN SODIUM 7.5 MG PO TABS: 7.5000 mg | ORAL_TABLET | Freq: Every day | ORAL | Status: AC

## 2015-11-02 NOTE — Care Management Note (Signed)
Case Management Note Previous CM note initiated by Elenor Quinones RN, CM  Patient Details  Name: Karen Chambers MRN: [id number] Date of Birth: 1945-12-13  Subjective/Objective:      Pt is s/p TAVR              Action/Plan:   Pt is independent from home with strong family support.  Husband/family will provide 24 hour supervision post discharge.  Pt already has walker at home due to RA in back and knee in addition to oxygen concentrator in the home for night use only 3 liters , pt states she has purchased concentrator and has portable if needed to transport home.  CM will continue to follow for discharge needs  Expected Discharge Date:     11/02/15             Expected Discharge Plan:  [place]  In-House Referral:     Discharge planning Services  CM Consult  Post Acute Care Choice:  Home Health, Durable Medical Equipment Choice offered to:  Patient, Spouse  DME Arranged:  3-N-1 DME Agency:  [place]:  PT, OT [facility] Agency:  [place]  Status of Service:  Completed, signed off  Medicare Important Message Given:    Date Medicare IM Given:    Medicare IM give by:    Date Additional Medicare IM Given:    Additional Medicare Important Message give by:     If discussed at [place] of Stay Meetings, dates discussed:    Discharge Disposition: Home/Home Health   Additional Comments:  11/02/15- 1030- Melvin Whiteford RN, BSN - pt for d/c home today- spoke with pt and spouse in room- discussed PT recommendation for SNF- per pt she does not want SNF wants to go home- husband states pt is close to baseline function and they can handle things at home- pt has DME that includes cane, RW, w/c at home- would like 3n1 for home- and gait belt (to go to local store for belt).  Will have PA- place DME order for 3n1 - call made to [facility] with [facility] to have DME delivered to room prior to discharge- spoke with PA who also placed [place] orders for  PT/OT- choice offered to pt and spouse for [facility] agencies in [place]- per choice they would like to use [facility] for services- referral called to Manuela Schwartz with [facility] for HH-PT/OT.  Also spoke with pt regarding home 02 needs- per discussion with pt and spouse- they have purchased a home portable concentrator- pt mainly uses 02 at night but also uses it during the day as needed- they do not have services for 02 through any DME company as they own the concentrator  And is portable for when pt needs it to be. No further needs for pt regarding home 02 at this time.   Dawayne Patricia, RN 11/02/2015, 11:09 AM

## 2015-11-02 NOTE — Progress Notes (Signed)
ANTICOAGULATION CONSULT NOTE - Follow Up Consult  Pharmacy Consult for Lovenox/Coumadin  Indication: Hypercoagulable state  Allergies  Allergen Reactions  . Allopurinol Itching, Nausea Only, Rash and Other (See Comments)    Top of head to tip of toes with rash; aches all over  . Cephalexin Other (See Comments)    Severe case of thrush in mouth/tongue.  Tongue raw and hard and swollen.  . Nitrofurantoin Hives, Itching, Nausea And Vomiting, Rash and Other (See Comments)    Body aches, like I have the flu  . Ace Inhibitors Other (See Comments)    cough  . Sulfonamide Derivatives Hives, Itching and Rash    Patient Measurements: Height: 5\' 3"  (160 cm) Weight: 160 lb 6.4 oz (72.757 kg) IBW/kg (Calculated) : 52.4 Heparin Dosing Weight:   Vital Signs: Temp: 97.8 F (36.6 C) (02/10 0455) Temp Source: Oral (02/10 0455) BP: 161/94 mmHg (02/10 0455) Pulse Rate: 96 (02/10 0455)  Labs:  Recent Labs  10/30/15 1438  10/31/15 0405  11/01/15 0055 11/01/15 0529 11/01/15 1251 11/02/15 0240  HGB 6.3*  < > 9.0*  --  8.7*  --   --  8.3*  HCT 21.7*  < > 29.9*  --  28.7*  --   --  27.8*  PLT 248  --  201  --  162  --   --  166  APTT 43*  --   --   --   --   --   --   --   LABPROT 18.1*  --   --   --  17.7*  --   --  17.4*  INR 1.49  --   --   --  1.45  --   --  1.41  HEPARINUNFRC  --   --   --   < >  --  0.47 0.59 0.25*  CREATININE  --   --  1.93*  --  2.16*  --   --  2.27*  < > = values in this interval not displayed.  Estimated Creatinine Clearance: 21.7 mL/min (by C-G formula based on Cr of 2.27).   Assessment: 70 yo woman admitted 10/30/2015 for TAVR.  PMH: HTN, remote smoking, COPD, RA (prednisone/MTZ), CVA, anti-phospholipid antibody syndrome with hypercoagulability (on Coumadin PTA), heart murmur, HLD, hypothyroid, gout, low back pain, Von Willebrand disease , AS, AAD  Anticoagulation Warfarin PTA for hx APL Ab syndrome w/ hypercoagulability  - PTA warfarin dose 2.5 mg and 5  mg alternating days, INR 1.34 on admit (goal 2-3),  2/8: Started warfarin with heparin bridge (goal 0.3-0.5)  2/8 PM: Bleeding with IV site change requiring multiple dressing changes and pressure x 2 hours. 2/9: INR 1.45, warfarin dose not given 2/8, HL=0.59 on 1200 units/hr 2/10: Hep>LMWH. Scr 2.27 up, CrCl 21. Hgb 8.3. Plts 166, INR 1.41 did not budge   Goal of Therapy:  Anti-Xa level 0.6-1 units/ml 4hrs after LMWH dose given  INR 2-3 Monitor platelets by anticoagulation protocol: Yes   Plan:  D/c heparin Lovenox 70mg  SQ q24h (CrCl<30) Coumadin 7.5mg  po q1800. Recommend INR check Monday.   Lenardo Westwood S. Alford Highland, PharmD, [facility] Clinical Staff Pharmacist Pager [phone]  Eilene Ghazi Stillinger 11/02/2015,9:59 AM

## 2015-11-02 NOTE — Progress Notes (Signed)
CARDIAC REHAB PHASE I   PRE:  Rate/Rhythm: 102 ST  BP:  Supine:   Sitting: 150/68  Standing:    SaO2: 86%RA,  Put back on 3L 94%  MODE:  Ambulation: 20 ft   POST:  Rate/Rhythm: 125 ST  BP:  Supine:   Sitting: 149/85  Standing:    SaO2: 92% 3L 0950-1030 Assisted pt to [facility] with asst x 2 and then walked 20 ft outside of room. Pt very deconditioned and weak. Needed gait belt use, asst x 2 and rolling walker to walk with pt very bent over and putting lots of pressure on arms. Pt's husband in room and stated pt was close to baseline. Stated she only walks in house. Discussed with pt and husband that she needed to wear the oxygen during day also as RA sats low. Would also suggest gait belt use if husband would like to get one. Offered HHPT to assess safety and help with strengthening and they are in agreement. Encouraged IS. Notified case manager that pt needs to be seen for discharge needs. Pt not appropriate for CRP 2 due to mobility issues.   Graylon Good, RN BSN  11/02/2015 10:24 AM

## 2015-11-02 NOTE — Care Management Important Message (Signed)
Important Message  Patient Details  Name: Karen Chambers MRN: [id number] Date of Birth: October 24, 1945   Medicare Important Message Given:  Yes    Calani Gick Abena 11/02/2015, 11:12 AM
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
## 2015-11-02 NOTE — Progress Notes (Signed)
ANTICOAGULATION CONSULT NOTE - Follow Up Consult  Pharmacy Consult for heparin Indication: hypercoagulable   Labs:  Recent Labs  10/30/15 0717  10/30/15 1343  10/30/15 1438  10/31/15 0405  11/01/15 0055 11/01/15 0529 11/01/15 1251 11/02/15 0240  HGB  --   < > 6.8*  --  6.3*  < > 9.0*  --  8.7*  --   --  8.3*  HCT  --   < > 20.0*  --  21.7*  < > 29.9*  --  28.7*  --   --  27.8*  PLT  --   --   --   < > 248  --  201  --  162  --   --  166  APTT 48*  --   --   --  43*  --   --   --   --   --   --   --   LABPROT 16.7*  --   --   --  18.1*  --   --   --  17.7*  --   --  17.4*  INR 1.34  --   --   --  1.49  --   --   --  1.45  --   --  1.41  HEPARINUNFRC  --   --   --   --   --   --   --   < >  --  0.47 0.59 0.25*  CREATININE  --   < > 1.70*  --   --   --  1.93*  --  2.16*  --   --   --   < > = values in this interval not displayed.    Assessment: 70yo female now subtherapeutic on heparin after rate change.  Goal of Therapy:  Heparin level 0.3-0.5 units/ml   Plan:  Will increase heparin gtt slightly to 1200 units/hr and check level in [place], PharmD, BCPS  11/02/2015,3:12 AM

## 2015-11-02 NOTE — Discharge Summary (Signed)
Discharge Summary    Patient ID: Karen Chambers,  MRN: [id number], DOB/AGE: 72-May-1947 72 y.o.  Admit date: 10/30/2015 Discharge date: 11/02/2015  Primary Care Provider: Aura Chambers Primary Cardiologist: Dr. Aundra Chambers Dr. Julianne Chambers (TAVR)   Discharge Diagnoses    Principal Problem:   Severe aortic valve stenosis- s/p TAVR Active Problems:   HTN (hypertension)   COPD (chronic obstructive pulmonary disease) (HCC)   Hyperlipidemia   Antiphospholipid antibody syndrome (HCC)   IDA (iron deficiency anemia)   Chronic kidney disease   Allergies Allergies  Allergen Reactions  . Allopurinol Itching, Nausea Only, Rash and Other (See Comments)    Top of head to tip of toes with rash; aches all over  . Cephalexin Other (See Comments)    Severe case of thrush in mouth/tongue.  Tongue raw and hard and swollen.  . Nitrofurantoin Hives, Itching, Nausea And Vomiting, Rash and Other (See Comments)    Body aches, like I have the flu  . Ace Inhibitors Other (See Comments)    cough  . Sulfonamide Derivatives Hives, Itching and Rash     History of Present Illness     GIOVANNY Chambers is a 70 y.o. female with a history of HTN, remote smoking, CKD, COPD on 02 QHS, RA, anti-phospholipid antibody syndrome with hypercoagulability treated with chronic coumadin and severe symptomatic AS who was admitted to [facility] on 10/30/15 for planned TAVR.   She reported a history of a heart murmur since she was young and has had echos in the past without any abnormality. She had a COPD flare in 06/2015 and was treated with prednisone. She continued to have shortness of breath and a CXR showed pulmonary edema. Her BNP was elevated and her murmur was felt to be different. She had an echo on 07/30/2015 that showed a trileaflet aortic valve with severely calcified leaflets and severe AS with a mean gradient of 58 mm Hg and a peak gradient of 108 mm Hg. The mitral valve and annulus are heavily calcified with a mean  gradient of 10 mm Hg and a MVA by PHRT of 2.6 cm2 consistent with mild MS. She underwent cardiac cath on 08/09/2015 that showed mild non-obstructive CAD with mildly elevated left heart pressures and mild pulmonary hypertension.  She was seen by Dr. Cyndia Chambers who felt that she was a poor candidate for open surgical AVR due to a combination of her age, COPD on oxygen, RA with limited mobility, CKD and anti-phospholipid antibody syndrome with hypercoagulability on Coumadin. It was elected to proceed with TAVR via right transfemoral approach which was scheduled for 10/29/2014.   Hospital Course     Consultants: CTCS  1. Severe aortic valve stenosis: POD #3 s/p TAVR. Doing well this am. Echo with mild per-valvular AI (Dr. Julianne Chambers reviewed and suspected that noise in the LVOT from her Mitral stenosis was contributing to artifact that was interpreted on report as moderate AI). She also has basal septal hypertrophy which is contributing to perceived gradient across the bioprosthetic valve. The valve is functioning as to be expected. Will continue ASA and coumadin.   2. Iron deficiency anemia: Baseline anemia (Hgb 8.1 pre-procedure) worsened post procedure. S/p 2 units pRBCs 10/30/15. H/H stable this am.   3. Hypercoagulable state. She is on IV heparin while awaiting therapeutic INR on coumadin. Will stop IV heparin today and start Lovenox with one dose here before discharge. INR is 1.4 this am. Will transition heparin to Lovenox today. She will need therapeutic INR before  Lovenox is stopped due to hypercoagulable state.   Pharmacy recommends Lovenox 70mg  SQ q24h (CrCl<30) and Coumadin 7.5mg  po q1800. Recommend INR check Monday. I have had coumadin clinic arranged at Dr. Virgie Dad office on Monday 11/05/15 @ 11:45am.   4. Acute on chronic kidney disease, stage 3: Stable, renal function at baseline.   The patient has had an uncomplicated hospital course and is recovering well. The femoral catheter site is  stable. She has been seen by Dr. Julianne Chambers today and deemed ready for discharge home. All follow-up appointments have been scheduled.  Discharge medications are listed below.  _____________  Discharge Vitals Blood pressure 161/94, pulse 96, temperature 97.8 F (36.6 C), temperature source Oral, resp. rate 20, height 5\' 3"  (1.6 m), weight 160 lb 6.4 oz (72.757 kg), SpO2 98 %.  Filed Weights   10/31/15 0415 11/01/15 0700  Weight: 161 lb 6.4 oz (73.211 kg) 160 lb 6.4 oz (72.757 kg)    Labs & Radiologic Studies     CBC  Recent Labs  11/01/15 0055 11/02/15 0240  WBC 12.5* 10.3  HGB 8.7* 8.3*  HCT 28.7* 27.8*  MCV 78.6 81.0  PLT 162 [id number]   Basic Metabolic Panel  Recent Labs  10/31/15 0405 11/01/15 0055 11/02/15 0240  NA 137 137 139  K 4.1 4.4 4.6  CL 106 109 108  CO2 22 21* 23  GLUCOSE 107* 128* 107*  BUN 28* 31* 33*  CREATININE 1.93* 2.16* 2.27*  CALCIUM 9.1 9.1 9.5  MG 2.8*  --   --     Dg Chest 2 View  10/26/2015  CLINICAL DATA:  Severe aortic stenosis, preop chest exam EXAM: CHEST  2 VIEW COMPARISON:  CT chest 10/12/2015 FINDINGS: The lungs are hyperinflated likely secondary to COPD. There is no focal parenchymal opacity. There is no pleural effusion or pneumothorax. There is stable cardiomegaly. There is thoracic aortic atherosclerosis. The osseous structures are unremarkable. IMPRESSION: No active cardiopulmonary disease. Electronically Signed   By: Kathreen Devoid   On: 10/26/2015 11:32   Dg Chest Port 1 View  10/31/2015  CLINICAL DATA:  TAVR EXAM: PORTABLE CHEST 1 VIEW COMPARISON:  10/30/2015 FINDINGS: TAVR remains in good position and unchanged. Endotracheal tube removed. Swan-Ganz catheter tip in the main pulmonary artery. No pneumothorax Progression of vascular congestion and small effusions suggesting fluid overload. Mild bibasilar atelectasis. IMPRESSION: Mild progression of vascular congestion and interstitial edema. Mild increase in bilateral pleural effusions.  Electronically Signed   By: Franchot Gallo M.D.   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 Initial encounter. EXAM: PORTABLE CHEST 1 VIEW COMPARISON:  CT chest 10/12/2015.  PA and lateral chest 10/26/2015. FINDINGS: Endotracheal tube is in place with the tip in good position just below the clavicular heads. NG tube courses into the stomach and below the inferior margin of the film. Right IJ approach Swan-Ganz catheter tip is in the pulmonary outflow tract. There is a small left pleural effusion and basilar atelectasis. Minimal right basilar atelectasis is seen. No pneumothorax. The lungs are emphysematous. IMPRESSION: Support tubes and lines project in good position. Negative for pneumothorax. Small left pleural effusion with basilar atelectasis. Emphysema. Electronically Signed   By: Inge Rise M.D.   On: 10/30/2015 14:52   Ct Angio Chest Aorta W/cm &/or Wo/cm  10/12/2015  CLINICAL DATA:  70 year old female with history of severe aortic stenosis. Preprocedural study prior to potential transcatheter  aortic valve replacement (TAVR). EXAM: CT ANGIOGRAPHY CHEST, ABDOMEN AND PELVIS TECHNIQUE: Multidetector CT imaging through the chest, abdomen and pelvis was performed using the standard protocol during bolus administration of intravenous contrast. Multiplanar reconstructed images and MIPs were obtained and reviewed to evaluate the vascular anatomy. CONTRAST:  79mL OMNIPAQUE IOHEXOL 350 MG/ML SOLN COMPARISON:  Cardiac CTA 09/28/2015.  Chest CT 05/27/2005. FINDINGS: CTA CHEST FINDINGS Mediastinum/Lymph Nodes: Heart size is mildly enlarged. There is no significant pericardial fluid, thickening or pericardial calcification. There is atherosclerosis of the thoracic aorta, the great vessels of the mediastinum and the coronary arteries, including calcified atherosclerotic plaque in the left main, left anterior descending, left  circumflex and right coronary arteries. Severe thickening calcification of the aortic valve, mitral valve and mitral annulus. Multiple borderline enlarged and mildly enlarged mediastinal and bilateral hilar lymph nodes, measuring up to 11 mm in short axis in the AP window nodal station. Esophagus is unremarkable in appearance. No axillary lymphadenopathy. Lungs/Pleura: 7 x 5 mm nodule in the left upper lobe again noted (image 33 of series 8), which is only slightly larger than remote prior study 05/27/2005, strongly favored to be benign. 10 x 9 mm pleural-based nodule in the medial aspect of the apex of the right upper lobe (image 16 of series 8), new compared to prior study 05/27/2005. 7 mm subpleural nodule in the periphery of the lateral segment of the right middle lobe (image 100 of series 8), unchanged compared to 05/27/2005, considered benign, presumably a subpleural lymph node. Small thick-walled cavitary area measuring approximately 1.4 x 0.8 cm in the periphery of the right lower lobe (image 118 of series 8) immediately above the right hemidiaphragm, with a small amount of surrounding ground-glass attenuation and septal thickening. Small calcified granuloma in the right lower lobe. Mild diffuse ground-glass attenuation and septal thickening in the lungs bilaterally, suggesting a background of mild interstitial pulmonary edema. Mild diffuse bronchial wall thickening with mild centrilobular and paraseptal emphysema. Musculoskeletal/Soft Tissues: There are no aggressive appearing lytic or blastic lesions noted in the visualized portions of the skeleton. CTA ABDOMEN AND PELVIS FINDINGS Hepatobiliary: A few scattered low-attenuation lesions are noted in the liver, compatible with simple cysts, the largest of which measures 11 mm in segment 4 day. No other suspicious hepatic lesions are noted. No intra or extrahepatic biliary ductal dilatation. Gallbladder is presumably surgically absent (no surgical clips are  noted in the gallbladder fossa). Pancreas: No pancreatic mass. No pancreatic ductal dilatation. No pancreatic or peripancreatic fluid or inflammatory changes. Spleen: Unremarkable. Adrenals/Urinary Tract: Severe atrophy of the right kidney. Multifocal cortical thinning in the kidneys bilaterally, presumably areas of chronic post infectious or inflammatory scarring. Multiple low-attenuation lesions in the kidneys bilaterally, largest of which are compatible with simple cysts, which measure up to 1.5 cm in diameter in the lateral aspect of the interpolar region of the right kidney. Multiple other smaller sub cm low-attenuation lesions in the kidneys bilaterally are too small to definitively characterize. Mild irregularity of the adrenal glands, without definite dominant nodule. No hydroureteronephrosis. Urinary bladder is normal in appearance. Stomach/Bowel: Normal appearance of the stomach. No pathologic dilatation of small bowel or colon. Appendix is not identified, likely surgically absent. Regardless, there are no inflammatory changes adjacent to the cecum to suggest presence of an acute appendicitis at this time. Vascular/Lymphatic: Extensive atherosclerosis throughout the abdominal and pelvic vasculature, with vascular findings and measurements pertinent to potential TAVR procedure, as detailed below. No aneurysm or dissection identified. No lymphadenopathy noted in  the abdomen or pelvis. Reproductive: Status post hysterectomy. Ovaries are not confidently identified may be surgically absent or atrophic. Other: No significant volume of ascites.  No pneumoperitoneum. Musculoskeletal: Multiple small well-defined sclerotic lesions with narrow zones of transition are noted throughout the visualized axial and appendicular skeleton, predominantly in the bony pelvis and lower lumbar spine, favored to represent bone islands. There are no other aggressive appearing lytic or blastic lesions noted in the visualized portions  of the skeleton. VASCULAR MEASUREMENTS PERTINENT TO TAVR: AORTA: Minimal Aortic Diameter -  11 x 11 mm Severity of Aortic Calcification -  severe RIGHT PELVIS: Right Common Iliac Artery - Minimal Diameter - 6.1 x 6.7 mm Tortuosity - mild to moderate Calcification - moderate to severe Right External Iliac Artery - Minimal Diameter - 6.7 x 6.4 mm Tortuosity - mild Calcification - mild Right Common Femoral Artery - Minimal Diameter - 5.7 x 7.0 mm Tortuosity - mild Calcification - mild to moderate LEFT PELVIS: Left Common Iliac Artery - Minimal Diameter - 5.8 x 8.0 mm Tortuosity - moderate Calcification - moderate to severe Left External Iliac Artery - Minimal Diameter - 7.1 x 7.6 mm Tortuosity - mild Calcification - mild Left Common Femoral Artery - Minimal Diameter - 7.0 x 5.4 mm Tortuosity - mild Calcification - moderate Review of the MIP images confirms the above findings. IMPRESSION: 1. Vascular findings and measurements pertinent to potential TAVR procedure, as detailed above. This patient does appear to have suitable pelvic arterial access bilaterally. 2. Severe thickening calcification of the aortic valve, compatible with the patient's reported clinical history of severe aortic stenosis. There is also severe thickening calcification of the mitral valve and mitral annulus. 3. Multiple pulmonary nodules, largest of which is a pleural-based 1.0 x 0.9 cm nodule in the medial aspect of the apex of the right upper lobe. This could represent an area of post infectious or inflammatory scarring, however, neoplasm is not excluded. In addition, there is a thick-walled cavitary area in the periphery of the right lower lobe measuring 1.4 x 0.8 cm (image 118 of series 8), which is favored to be post infectious/inflammatory. Attention on repeat chest CT is suggested in the next 2-3 months to ensure the stability or resolution of these findings. 4. In addition, there is mediastinal and bilateral hilar lymphadenopathy, similar  to the recent prior examination. Clinical correlation for signs and symptoms of lymphoproliferative disorder is recommended. However, given the evidence for mild interstitial pulmonary edema in the lungs, the low-level lymphadenopathy may simply be related to underlying congestive heart failure. 5. Mild cardiomegaly. 6. Atherosclerosis, including left main and 3 vessel coronary artery disease. Assessment for potential risk factor modification, dietary therapy or pharmacologic therapy may be warranted, if clinically indicated. 7. Mild diffuse bronchial wall thickening with mild centrilobular and paraseptal emphysema. 8. Additional incidental findings, as above. Electronically Signed   By: Vinnie Langton M.D.   On: 10/12/2015 17:42   Ct Angio Abd/pel W/ And/or W/o  10/12/2015  CLINICAL DATA:  70 year old female with history of severe aortic stenosis. Preprocedural study prior to potential transcatheter aortic valve replacement (TAVR). EXAM: CT ANGIOGRAPHY CHEST, ABDOMEN AND PELVIS TECHNIQUE: Multidetector CT imaging through the chest, abdomen and pelvis was performed using the standard protocol during bolus administration of intravenous contrast. Multiplanar reconstructed images and MIPs were obtained and reviewed to evaluate the vascular anatomy. CONTRAST:  3mL OMNIPAQUE IOHEXOL 350 MG/ML SOLN COMPARISON:  Cardiac CTA 09/28/2015.  Chest CT 05/27/2005. FINDINGS: CTA CHEST FINDINGS Mediastinum/Lymph Nodes: Heart  size is mildly enlarged. There is no significant pericardial fluid, thickening or pericardial calcification. There is atherosclerosis of the thoracic aorta, the great vessels of the mediastinum and the coronary arteries, including calcified atherosclerotic plaque in the left main, left anterior descending, left circumflex and right coronary arteries. Severe thickening calcification of the aortic valve, mitral valve and mitral annulus. Multiple borderline enlarged and mildly enlarged mediastinal and  bilateral hilar lymph nodes, measuring up to 11 mm in short axis in the AP window nodal station. Esophagus is unremarkable in appearance. No axillary lymphadenopathy. Lungs/Pleura: 7 x 5 mm nodule in the left upper lobe again noted (image 33 of series 8), which is only slightly larger than remote prior study 05/27/2005, strongly favored to be benign. 10 x 9 mm pleural-based nodule in the medial aspect of the apex of the right upper lobe (image 16 of series 8), new compared to prior study 05/27/2005. 7 mm subpleural nodule in the periphery of the lateral segment of the right middle lobe (image 100 of series 8), unchanged compared to 05/27/2005, considered benign, presumably a subpleural lymph node. Small thick-walled cavitary area measuring approximately 1.4 x 0.8 cm in the periphery of the right lower lobe (image 118 of series 8) immediately above the right hemidiaphragm, with a small amount of surrounding ground-glass attenuation and septal thickening. Small calcified granuloma in the right lower lobe. Mild diffuse ground-glass attenuation and septal thickening in the lungs bilaterally, suggesting a background of mild interstitial pulmonary edema. Mild diffuse bronchial wall thickening with mild centrilobular and paraseptal emphysema. Musculoskeletal/Soft Tissues: There are no aggressive appearing lytic or blastic lesions noted in the visualized portions of the skeleton. CTA ABDOMEN AND PELVIS FINDINGS Hepatobiliary: A few scattered low-attenuation lesions are noted in the liver, compatible with simple cysts, the largest of which measures 11 mm in segment 4 day. No other suspicious hepatic lesions are noted. No intra or extrahepatic biliary ductal dilatation. Gallbladder is presumably surgically absent (no surgical clips are noted in the gallbladder fossa). Pancreas: No pancreatic mass. No pancreatic ductal dilatation. No pancreatic or peripancreatic fluid or inflammatory changes. Spleen: Unremarkable.  Adrenals/Urinary Tract: Severe atrophy of the right kidney. Multifocal cortical thinning in the kidneys bilaterally, presumably areas of chronic post infectious or inflammatory scarring. Multiple low-attenuation lesions in the kidneys bilaterally, largest of which are compatible with simple cysts, which measure up to 1.5 cm in diameter in the lateral aspect of the interpolar region of the right kidney. Multiple other smaller sub cm low-attenuation lesions in the kidneys bilaterally are too small to definitively characterize. Mild irregularity of the adrenal glands, without definite dominant nodule. No hydroureteronephrosis. Urinary bladder is normal in appearance. Stomach/Bowel: Normal appearance of the stomach. No pathologic dilatation of small bowel or colon. Appendix is not identified, likely surgically absent. Regardless, there are no inflammatory changes adjacent to the cecum to suggest presence of an acute appendicitis at this time. Vascular/Lymphatic: Extensive atherosclerosis throughout the abdominal and pelvic vasculature, with vascular findings and measurements pertinent to potential TAVR procedure, as detailed below. No aneurysm or dissection identified. No lymphadenopathy noted in the abdomen or pelvis. Reproductive: Status post hysterectomy. Ovaries are not confidently identified may be surgically absent or atrophic. Other: No significant volume of ascites.  No pneumoperitoneum. Musculoskeletal: Multiple small well-defined sclerotic lesions with narrow zones of transition are noted throughout the visualized axial and appendicular skeleton, predominantly in the bony pelvis and lower lumbar spine, favored to represent bone islands. There are no other aggressive appearing lytic or blastic  lesions noted in the visualized portions of the skeleton. VASCULAR MEASUREMENTS PERTINENT TO TAVR: AORTA: Minimal Aortic Diameter -  11 x 11 mm Severity of Aortic Calcification -  severe RIGHT PELVIS: Right Common Iliac  Artery - Minimal Diameter - 6.1 x 6.7 mm Tortuosity - mild to moderate Calcification - moderate to severe Right External Iliac Artery - Minimal Diameter - 6.7 x 6.4 mm Tortuosity - mild Calcification - mild Right Common Femoral Artery - Minimal Diameter - 5.7 x 7.0 mm Tortuosity - mild Calcification - mild to moderate LEFT PELVIS: Left Common Iliac Artery - Minimal Diameter - 5.8 x 8.0 mm Tortuosity - moderate Calcification - moderate to severe Left External Iliac Artery - Minimal Diameter - 7.1 x 7.6 mm Tortuosity - mild Calcification - mild Left Common Femoral Artery - Minimal Diameter - 7.0 x 5.4 mm Tortuosity - mild Calcification - moderate Review of the MIP images confirms the above findings. IMPRESSION: 1. Vascular findings and measurements pertinent to potential TAVR procedure, as detailed above. This patient does appear to have suitable pelvic arterial access bilaterally. 2. Severe thickening calcification of the aortic valve, compatible with the patient's reported clinical history of severe aortic stenosis. There is also severe thickening calcification of the mitral valve and mitral annulus. 3. Multiple pulmonary nodules, largest of which is a pleural-based 1.0 x 0.9 cm nodule in the medial aspect of the apex of the right upper lobe. This could represent an area of post infectious or inflammatory scarring, however, neoplasm is not excluded. In addition, there is a thick-walled cavitary area in the periphery of the right lower lobe measuring 1.4 x 0.8 cm (image 118 of series 8), which is favored to be post infectious/inflammatory. Attention on repeat chest CT is suggested in the next 2-3 months to ensure the stability or resolution of these findings. 4. In addition, there is mediastinal and bilateral hilar lymphadenopathy, similar to the recent prior examination. Clinical correlation for signs and symptoms of lymphoproliferative disorder is recommended. However, given the evidence for mild interstitial  pulmonary edema in the lungs, the low-level lymphadenopathy may simply be related to underlying congestive heart failure. 5. Mild cardiomegaly. 6. Atherosclerosis, including left main and 3 vessel coronary artery disease. Assessment for potential risk factor modification, dietary therapy or pharmacologic therapy may be warranted, if clinically indicated. 7. Mild diffuse bronchial wall thickening with mild centrilobular and paraseptal emphysema. 8. Additional incidental findings, as above. Electronically Signed   By: Vinnie Langton M.D.   On: 10/12/2015 17:42     Diagnostic Studies/Procedures    CARDIOTHORACIC SURGERY OPERATIVE NOTE  Date of Procedure:10/30/2015  Preoperative Diagnosis:Severe Aortic Stenosis   Postoperative Diagnosis:Same   Procedure:   Transcatheter Aortic Valve Replacement - Right Transfemoral Approach Edwards Sapien 3 Transcatheter Heart Valve (size 23 mm, model # 9600TFX, serial # [id number])  Co-Surgeons:Bryan Alveria Apley, MD and Lauree Chandler, MD  Assistants:none  Anesthesiologist:Adam Marcie Bal, MD  Dala Dock, MD  Pre-operative Echo Findings:  severe aortic stenosis  normal left ventricular systolic function   Post-operative Echo Findings:  trivial paravalvular leak  normal left ventricular systolic function _____________  2D ECHO: 10/31/2015 LV EF: 65% -  70% Study Conclusions - Left ventricle: The cavity size was normal. There was severe focal basal hypertrophy of the septum. Systolic function was vigorous. The estimated ejection fraction was in the range of 65% to 70%. Wall motion was normal; there were no regional wall motion abnormalities. Doppler parameters are consistent with abnormal left ventricular relaxation (grade 1  diastolic dysfunction). - Aortic valve:  TAVR - Edwards Sapien 3 THV (23 mm) There was moderate regurgitation directed eccentrically in the LVOT. Peak velocity (S): 373 cm/s. Mean gradient (S): 26 mm Hg. Peak velocity is higher than expected post TAVR. - Mitral valve: Moderately calcified annulus. Mildly thickened, moderately calcified leaflets . Mobility was restricted. The findings are consistent with moderate stenosis. Mean gradient (D): 11 mm Hg. - Left atrium: The atrium was mildly dilated. - Pulmonary arteries: Systolic pressure was mildly increased. PA peak pressure: 40 mm Hg (S).   Disposition   Pt is being discharged home today in good condition.  Follow-up Plans & Appointments    Follow-up Information    Follow up with Gaye Pollack, MD On 11/21/2015.   Specialty:  Cardiothoracic Surgery   Why:  @ 10am   Contact information:   [address] [place] [id number] [phone]       Follow up with Lauree Chandler, MD On 11/29/2015.   Specialty:  Cardiology   Why:  @ 3pm for an ultrasound of your heart followed by an appointment with Dr Karen Chambers at [facility] information:   [place] [place] [address] [phone]       Follow up with Chauncey Cruel, MD On 11/05/2015.   Specialty:  Oncology   Why:  @ 11:45am   Contact information:   [place] [address] [phone]        Discharge Medications   Current Discharge Medication List    START taking these medications   Details  metoprolol tartrate (LOPRESSOR) 25 MG tablet Take 1 tablet (25 mg total) by mouth 2 (two) times daily. Qty: 60 tablet, Refills: 6      CONTINUE these medications which have CHANGED   Details  enoxaparin (LOVENOX) 80 MG/0.8ML injection Inject 0.7 mLs (70 mg total) into the skin daily. Qty: 10 Syringe, Refills: 1    warfarin (COUMADIN) 7.5 MG tablet Take 1 tablet (7.5 mg total) by mouth daily at 6 PM. Qty: 3 tablet, Refills: 0        CONTINUE these medications which have NOT CHANGED   Details  acetaminophen (TYLENOL ARTHRITIS PAIN) 650 MG CR tablet Take 650-1,300 mg by mouth 2 (two) times daily.    amLODipine (NORVASC) 10 MG tablet Take 1 tablet (10 mg total) by mouth every evening. Qty: 90 tablet, Refills: 4    aspirin 81 MG tablet Take 81 mg by mouth at bedtime.     atorvastatin (LIPITOR) 20 MG tablet Take 1 tablet (20 mg total) by mouth daily. Qty: 90 tablet, Refills: 3    febuxostat (ULORIC) 40 MG tablet Take 40 mg by mouth every morning.     furosemide (LASIX) 20 MG tablet Take 1 tablet (20 mg total) by mouth daily. Qty: 30 tablet, Refills: 3   Associated Diagnoses: Aortic stenosis    levothyroxine (SYNTHROID, LEVOTHROID) 100 MCG tablet TAKE 1 TABLET DAILY Qty: 90 tablet, Refills: 3    losartan (COZAAR) 50 MG tablet Take 1 tablet (50 mg total) by mouth daily. Qty: 30 tablet, Refills: 3    metoprolol succinate (TOPROL-XL) 100 MG 24 hr tablet Take 1 tablet (100 mg total) by mouth 2 (two) times daily. Take with or immediately following a meal. Qty: 180 tablet, Refills: 3    Multiple Vitamin (MULTIVITAMIN) capsule Take 1 capsule by mouth daily at 12 noon.     Omega 3 1200 MG CAPS Take  1,500 mg by mouth daily at 12 noon. 1200mg  softgel + 300mg  softgel.    potassium chloride (K-DUR) 10 MEQ tablet Take 1 tablet (10 mEq total) by mouth daily. Qty: 30 tablet, Refills: 3   Associated Diagnoses: Aortic stenosis    tiotropium (SPIRIVA HANDIHALER) 18 MCG inhalation capsule INHALE ONE CAPSULE ONCE DAILY Qty: 30 capsule, Refills: 6    traMADol (ULTRAM) 50 MG tablet Take 100 mg by mouth 2 (two) times daily.             Outstanding Labs/Studies: none    Duration of Discharge Encounter   Greater than 30 minutes including physician time.  Mable Fill R PA-C 11/02/2015, 10:58 AM

## 2015-11-02 NOTE — Discharge Instructions (Signed)
Take Lovenox shots everyday with 7.5mg  Coumadin x3 days until you see Dr. Jana Hakim on Monday for INR check. They will give you further directions.  Groin Site Care Refer to this sheet in the next few weeks. These instructions provide you with information on caring for yourself after your procedure. Your caregiver may also give you more specific instructions. Your treatment has been planned according to current medical practices, but problems sometimes occur. Call your caregiver if you have any problems or questions after your procedure. HOME CARE INSTRUCTIONS  You may shower 24 hours after the procedure. Remove the bandage (dressing) and gently wash the site with plain soap and water. Gently pat the site dry.   Do not apply powder or lotion to the site.   Do not sit in a bathtub, swimming pool, or whirlpool for 5 to 7 days.   No bending, squatting, or lifting anything over 10 pounds (4.5 kg) as directed by your caregiver.   Inspect the site at least twice daily.   Do not drive home if you are discharged the same day of the procedure. Have someone else drive you.   You may drive 24 hours after the procedure unless otherwise instructed by your caregiver.  What to expect:  Any bruising will usually fade within 1 to 2 weeks.   Blood that collects in the tissue (hematoma) may be painful to the touch. It should usually decrease in size and tenderness within 1 to 2 weeks.  SEEK IMMEDIATE MEDICAL CARE IF:  You have unusual pain at the groin site or down the affected leg.   You have redness, warmth, swelling, or pain at the groin site.   You have drainage (other than a small amount of blood on the dressing).   You have chills.   You have a fever or persistent symptoms for more than 72 hours.   You have a fever and your symptoms suddenly get worse.   Your leg becomes pale, cool, tingly, or numb.  You have heavy bleeding from the site. Hold pressure on the site. Marland Kitchen

## 2015-11-02 NOTE — Telephone Encounter (Signed)
This RN was contacted by Joellen Jersey PA with Cardiology at [facility].  Pt is being discharged post valve replacement on 7.5 mg coumadin and 70 mg lovenox daily.  Pt needs to have INR on 2/13 for possible stop of lovenox and then scheduled for INR checks per Dr Jana Hakim.  Appointment while on the phone with Joellen Jersey so pt could be informed upon d/c.

## 2015-11-02 NOTE — Progress Notes (Signed)
SUBJECTIVE: no pain or SOB.   Tele: sinus  BP 161/94 mmHg  Pulse 96  Temp(Src) 97.8 F (36.6 C) (Oral)  Resp 20  Ht 5\' 3"  (1.6 m)  Wt 160 lb 6.4 oz (72.757 kg)  BMI 28.42 kg/m2  SpO2 100%  Intake/Output Summary (Last 24 hours) at 11/02/15 0610 Last data filed at 11/01/15 1809  Gross per 24 hour  Intake    480 ml  Output    200 ml  Net    280 ml    PHYSICAL EXAM General: Well developed, well nourished, in no acute distress. Alert and oriented x 3.  Psych:  Good affect, responds appropriately Neck: No JVD. No masses noted.  Lungs: Clear bilaterally with no wheezes or rhonci noted.  Heart: RRR with no murmurs noted. Abdomen: Bowel sounds are present. Soft, non-tender.  Extremities: No lower extremity edema.   LABS: Basic Metabolic Panel:  Recent Labs  10/31/15 0405 11/01/15 0055 11/02/15 0240  NA 137 137 139  K 4.1 4.4 4.6  CL 106 109 108  CO2 22 21* 23  GLUCOSE 107* 128* 107*  BUN 28* 31* 33*  CREATININE 1.93* 2.16* 2.27*  CALCIUM 9.1 9.1 9.5  MG 2.8*  --   --    CBC:  Recent Labs  11/01/15 0055 11/02/15 0240  WBC 12.5* 10.3  HGB 8.7* 8.3*  HCT 28.7* 27.8*  MCV 78.6 81.0  PLT 162 166    Current Meds: . amLODipine  10 mg Oral QPM  . aspirin EC  81 mg Oral QHS  . atorvastatin  20 mg Oral q1800  . Chlorhexidine Gluconate Cloth  6 each Topical Daily  . docusate sodium  200 mg Oral Daily  . famotidine  20 mg Oral Daily  . furosemide  20 mg Oral Daily  . levothyroxine  100 mcg Oral QAC breakfast  . metoprolol tartrate  25 mg Oral BID  . moving right along book   Does not apply Once  . mupirocin ointment  1 application Nasal BID  . sodium chloride flush  3 mL Intravenous Q12H  . tiotropium  18 mcg Inhalation Daily  . Warfarin - Pharmacist Dosing Inpatient   Does not apply q1800     ASSESSMENT AND PLAN:  1. Severe aortic valve stenosis: POD #3 s/p TAVR. Doing well this am. Echo with mild per-valvular AI (I have reviewed myself and  suspect that noise in the LVOT from her Mitral stenosis is contributing to artifact that was interpreted on report as moderate AI). She also has basal septal hypertrophy which is contributing to perceived gradient across the bioprosthetic valve. The valve is functioning as to be expected. Will continue ASA and coumadin. INR is 1.4 this am. Will transition heparin to Lovenox today. She will need therapeutic INR before Lovenox is stopped due to hypercoagulable state.    2. Iron deficiency anemia: Baseline anemia (Hgb 8.1 pre-procedure) worsened post procedure. S/p 2 units pRBCs 10/30/15.  H/H stable this am.   3. Hypercoagulable state. She is on IV heparin while awaiting therapeutic INR on coumadin. Will stop IV heparin today and start Lovenox with one dose here before discharge. Will ask pharmacy to dose. She will need bridging until INR is therapeutic.    4. Acute on chronic kidney disease, stage 3: Stable, renal function at baseline.   D/C home today. She will need 1-2 week follow up with Dr. Cyndia Bent and then one month follow up with me (has to be  me) in the office with echo the day of her office follow up with me.   Karen Chambers  2/10/20176:10 AM

## 2015-11-03 LAB — TYPE AND SCREEN
ABO/RH(D): B POS
ANTIBODY SCREEN: NEGATIVE
UNIT DIVISION: 0
UNIT DIVISION: 0
Unit division: 0
Unit division: 0

## 2015-11-05 ENCOUNTER — Other Ambulatory Visit (HOSPITAL_BASED_OUTPATIENT_CLINIC_OR_DEPARTMENT_OTHER): Payer: Medicare HMO

## 2015-11-05 ENCOUNTER — Telehealth: Payer: Self-pay | Admitting: Cardiovascular Disease

## 2015-11-05 DIAGNOSIS — D689 Coagulation defect, unspecified: Secondary | ICD-10-CM

## 2015-11-05 LAB — PROTIME-INR
INR: 3.4 (ref 2.00–3.50)
Protime: 40.8 Seconds — ABNORMAL HIGH (ref 10.6–13.4)

## 2015-11-05 MED FILL — Phenylephrine HCl Inj 10 MG/ML: INTRAMUSCULAR | Qty: 2 | Status: AC

## 2015-11-05 MED FILL — Dextrose Inj 5%: INTRAVENOUS | Qty: 250 | Status: AC

## 2015-11-05 MED FILL — Magnesium Sulfate Inj 50%: INTRAMUSCULAR | Qty: 10 | Status: AC

## 2015-11-05 MED FILL — Dextrose Inj 5%: INTRAVENOUS | Qty: 500 | Status: AC

## 2015-11-05 MED FILL — Potassium Chloride Inj 2 mEq/ML: INTRAVENOUS | Qty: 40 | Status: AC

## 2015-11-05 MED FILL — Heparin Sodium (Porcine) Inj 1000 Unit/ML: INTRAMUSCULAR | Qty: 30 | Status: AC

## 2015-11-05 NOTE — Telephone Encounter (Signed)
Spoke with pt and gave her instructions from Dr. McAlhany.  

## 2015-11-05 NOTE — Telephone Encounter (Signed)
INR is 3.4 today. Can stop Lovenox. Can we let her know? Thanks,chris

## 2015-11-06 ENCOUNTER — Telehealth: Payer: Self-pay

## 2015-11-06 ENCOUNTER — Telehealth: Payer: Self-pay | Admitting: *Deleted

## 2015-11-06 NOTE — Telephone Encounter (Signed)
Pt did not remember if she supposed to continue the lasix 20 mg after being D/C from the hospital. Pt is aware that according her discharge note he is to continue taking Lasix 20 mg once daily, and potassium 10 mEq once daily. Pt is aware also of her post -hospital appointment with Dr. Angelena Form at 4:00 PM after having the Echo at 3:00 PM. Pt verbalized understanding.

## 2015-11-06 NOTE — Telephone Encounter (Signed)
Patient stated that Dr Karen Chambers ordered furosemide while she was in the hospital. She is questioning whether or not she needs to continue to take it as she is already on so many medications. She can be reached at [phone]. Please advise. Thanks, MI

## 2015-11-06 NOTE — Telephone Encounter (Signed)
Patient off the lovenox since 11/04/15 per surgeons office INR today (11/05/15) 3.4 Present coumadin dosing per patient- 7.5 mg alternating 7 mg Repeat lab per Dr. Jana Hakim in 2 weeks 11/20/15

## 2015-11-10 ENCOUNTER — Telehealth: Payer: Self-pay | Admitting: Oncology

## 2015-11-10 NOTE — Telephone Encounter (Signed)
Added lab for 2/28. Other appointments remain the same. Not able to reach patient by phone. Schedule mailed.

## 2015-11-20 ENCOUNTER — Other Ambulatory Visit (HOSPITAL_BASED_OUTPATIENT_CLINIC_OR_DEPARTMENT_OTHER): Payer: Medicare HMO

## 2015-11-20 DIAGNOSIS — D689 Coagulation defect, unspecified: Secondary | ICD-10-CM

## 2015-11-20 LAB — PROTIME-INR
INR: 2.6 (ref 2.00–3.50)
PROTIME: 31.2 s — AB (ref 10.6–13.4)

## 2015-11-21 ENCOUNTER — Ambulatory Visit (INDEPENDENT_AMBULATORY_CARE_PROVIDER_SITE_OTHER): Payer: Medicare HMO | Admitting: Surgery

## 2015-11-21 ENCOUNTER — Encounter: Payer: Self-pay | Admitting: Surgery

## 2015-11-21 VITALS — BP 160/79 | HR 89 | Resp 16 | Ht 63.0 in | Wt 160.0 lb

## 2015-11-21 DIAGNOSIS — N183 Chronic kidney disease, stage 3 unspecified: Secondary | ICD-10-CM

## 2015-11-21 DIAGNOSIS — I350 Nonrheumatic aortic (valve) stenosis: Secondary | ICD-10-CM | POA: Diagnosis not present

## 2015-11-21 DIAGNOSIS — Z954 Presence of other heart-valve replacement: Secondary | ICD-10-CM

## 2015-11-21 DIAGNOSIS — Z952 Presence of prosthetic heart valve: Secondary | ICD-10-CM

## 2015-11-21 DIAGNOSIS — I5032 Chronic diastolic (congestive) heart failure: Secondary | ICD-10-CM | POA: Diagnosis not present

## 2015-11-21 NOTE — Progress Notes (Signed)
HPI:  Patient returns for routine postoperative follow-up having undergone right transfemoral TAVR on 10/30/2015. The patient's early postoperative recovery while in the hospital was notable for an uncomplicated postop course. Her TEE in the OR showed a minimal post-deployment gradient and trivial paravavlular leak with no central AI. Her POD 1 echo was read as showing a mean gradient of 26 mm Hg with moderate regurgitation eccentrically in the LVOT. I had reviewed it and I really think that they were getting confused by flow through the mitral valve. Since hospital discharge the patient reports that she has had no shortness of breath or chest discomfort. Her only problem is pain from her bilateral knee arthritis that is preventing her from ambulation.   Current Outpatient Prescriptions  Medication Sig Dispense Refill  . acetaminophen (TYLENOL ARTHRITIS PAIN) 650 MG CR tablet Take 650-1,300 mg by mouth 2 (two) times daily.    Marland Kitchen amLODipine (NORVASC) 10 MG tablet Take 1 tablet (10 mg total) by mouth every evening. 90 tablet 4  . aspirin 81 MG tablet Take 81 mg by mouth at bedtime.     Marland Kitchen atorvastatin (LIPITOR) 20 MG tablet Take 1 tablet (20 mg total) by mouth daily. 90 tablet 3  . febuxostat (ULORIC) 40 MG tablet Take 40 mg by mouth every morning.     . furosemide (LASIX) 20 MG tablet Take 1 tablet (20 mg total) by mouth daily. 30 tablet 3  . levothyroxine (SYNTHROID, LEVOTHROID) 100 MCG tablet TAKE 1 TABLET DAILY 90 tablet 3  . losartan (COZAAR) 50 MG tablet Take 1 tablet (50 mg total) by mouth daily. 30 tablet 3  . metoprolol succinate (TOPROL-XL) 100 MG 24 hr tablet Take 1 tablet (100 mg total) by mouth 2 (two) times daily. Take with or immediately following a meal. 180 tablet 3  . metoprolol tartrate (LOPRESSOR) 25 MG tablet Take 1 tablet (25 mg total) by mouth 2 (two) times daily. 60 tablet 6  . Multiple Vitamin (MULTIVITAMIN) capsule Take 1 capsule by mouth daily at 12 noon.     . Omega  3 1200 MG CAPS Take 1,500 mg by mouth daily at 12 noon. 1200mg  softgel + 300mg  softgel.    . potassium chloride (K-DUR) 10 MEQ tablet Take 1 tablet (10 mEq total) by mouth daily. 30 tablet 3  . tiotropium (SPIRIVA HANDIHALER) 18 MCG inhalation capsule INHALE ONE CAPSULE ONCE DAILY 30 capsule 6  . traMADol (ULTRAM) 50 MG tablet Take 100 mg by mouth 2 (two) times daily.     Marland Kitchen warfarin (COUMADIN) 7.5 MG tablet Take 1 tablet (7.5 mg total) by mouth daily at 6 PM. 3 tablet 0   No current facility-administered medications for this visit.    Physical Exam: BP 160/79 mmHg  Pulse 89  Resp 16  Ht 5\' 3"  (1.6 m)  Wt 160 lb (72.576 kg)  BMI 28.35 kg/m2  SpO2 94% She looks well Cardiac exam shows a regular rate and rhythm with normal valve sounds and no murmur. Lungs are clear The right groin incision is healing well and the left groin catheter sites look good There is no peripheral edema.  Diagnostic Tests:  None today  Impression:  She is doing well following TAVR but is limited by her arthritis. She is going to follow up with her rheumatologist.  Plan:  She has a follow up appt with Dr. Angelena Form on 3/9 and will have an echo at that time. She will then follow up with Dr. Aundra Dubin  on 3/17.   Gaye Pollack, MD [facility] [phone]

## 2015-11-26 ENCOUNTER — Other Ambulatory Visit: Payer: Self-pay | Admitting: *Deleted

## 2015-11-27 ENCOUNTER — Telehealth: Payer: Self-pay | Admitting: Cardiology

## 2015-11-27 NOTE — Telephone Encounter (Signed)
Called Shanon Brow back with [facility]. Patient is unable to tolerated treatment for PT due to pain. Informed Shanon Brow that we will let Dr. Aundra Dubin know.

## 2015-11-27 NOTE — Telephone Encounter (Signed)
They have discharged pt from PT,because pt can not tolerate the treatment.

## 2015-11-28 ENCOUNTER — Encounter: Payer: Self-pay | Admitting: Cardiology

## 2015-11-28 ENCOUNTER — Other Ambulatory Visit: Payer: Medicare HMO

## 2015-11-29 ENCOUNTER — Encounter: Payer: Self-pay | Admitting: Cardiovascular Disease

## 2015-11-29 ENCOUNTER — Ambulatory Visit (HOSPITAL_COMMUNITY): Payer: Medicare HMO | Attending: Cardiovascular Disease

## 2015-11-29 ENCOUNTER — Ambulatory Visit (INDEPENDENT_AMBULATORY_CARE_PROVIDER_SITE_OTHER): Payer: Medicare HMO | Admitting: Cardiovascular Disease

## 2015-11-29 ENCOUNTER — Other Ambulatory Visit: Payer: Self-pay

## 2015-11-29 VITALS — BP 124/70 | HR 70 | Ht 63.0 in

## 2015-11-29 DIAGNOSIS — I517 Cardiomegaly: Secondary | ICD-10-CM | POA: Diagnosis not present

## 2015-11-29 DIAGNOSIS — Z87891 Personal history of nicotine dependence: Secondary | ICD-10-CM | POA: Insufficient documentation

## 2015-11-29 DIAGNOSIS — E785 Hyperlipidemia, unspecified: Secondary | ICD-10-CM | POA: Diagnosis not present

## 2015-11-29 DIAGNOSIS — I350 Nonrheumatic aortic (valve) stenosis: Secondary | ICD-10-CM | POA: Diagnosis not present

## 2015-11-29 DIAGNOSIS — I352 Nonrheumatic aortic (valve) stenosis with insufficiency: Secondary | ICD-10-CM | POA: Insufficient documentation

## 2015-11-29 DIAGNOSIS — I059 Rheumatic mitral valve disease, unspecified: Secondary | ICD-10-CM | POA: Diagnosis not present

## 2015-11-29 LAB — ECHOCARDIOGRAM COMPLETE
AO mean calculated velocity dopler: 226 cm/s
AOPV: 0.44 m/s
AV Mean grad: 24 mmHg
AVLVOTPG: 9 mmHg
AVPG: 46 mmHg
Area-P 1/2: 1.88 cm2
CHL CUP MV M VEL: 143
E decel time: 415 msec
FS: 41 % (ref 28–44)
HEIGHTINCHES: 63 in
IV/PV OW: 1.01
LEFT ATRIUM END SYS DIAM: 44 cm
LV PW d: 10.4 mm — AB (ref 0.6–1.1)
LV SIMPSON'S DISK: 70
LVOTPV: 148 m/s
LVOTVTI: 0.5 cm
MV pk A vel: 164 m/s
MV pk E vel: 198 m/s
MVG: 9 mmHg
MVPG: 16 mmHg
P 1/2 time: 117 ms
VTI: 69.1 cm

## 2015-11-29 NOTE — Progress Notes (Signed)
Chief Complaint  Patient presents with  . 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 She underwent TAVR on 10/30/15 with a 23 mm Sapien 3 valve from the right transfemoral approach. Her post-operative course was uneventful. She is followed by Dr. Aundra Dubin in cardiology and Dr. Chase Caller in Pulmonary clinic. Cardiac cath 08/09/15 with mild non-obstructive CAD.   She is overall doing well. She is having no chest pain. She is NYHA class 2-3. She has baseline shortness of breath with minimal exertion which is from her severe lung disease. This is unchanged. She is on home supplemental oxygen therapy. No dizziness, near syncope or syncope. She is limited by her knee pain and dyspnea.   Primary Care Physician: Aura Dials, PA-C  Cardiology: Dr. Aundra Dubin   Past Medical History  Diagnosis Date  . Other and unspecified hyperlipidemia 401.9  . Hypothyroidism   . Obesity, unspecified   . Gout   . Low back pain     buldging disc ,and herniated disc  . Rheumatoid arthritis(714.0)   . Cough 11/12/2010  . Hypertension   . Coagulopathy ([place])   . Von Willebrand disease ([place])     Dr. Jana Hakim follows  . Aortic stenosis   . CAD (coronary artery disease)   . TIA (transient ischemic attack)   . Chronic diastolic (congestive) heart failure ([place])   . Chronic kidney disease   . Antiphospholipid antibody syndrome ([place]) 02/03/2012  . IDA (iron deficiency anemia) 06/23/2014  . Complication of anesthesia     slow awaken from anesthesia  . Pneumonia   . COPD (chronic obstructive pulmonary disease) ([facility])     home O2-3 liters/min.  Marland Kitchen Heart murmur   . Stroke ([place]) 2000    TIA  . Shortness of breath dyspnea   . Depression     relative to pain    Past Surgical History  Procedure Laterality Date  . Cholecystectomy    .  Total abdominal hysterectomy    . Stapedectomy Bilateral     right ear hearing aid  . Esophagogastroduodenoscopy (egd) with propofol N/A 06/23/2014    Procedure: ESOPHAGOGASTRODUODENOSCOPY (EGD) WITH PROPOFOL;  Surgeon: Jerene Bears, MD;  Location: [facility];  Service: Gastroenterology;  Laterality: N/A;  . Colonoscopy with propofol N/A 06/23/2014    Procedure: COLONOSCOPY WITH PROPOFOL;  Surgeon: Jerene Bears, MD;  Location: [facility];  Service: Gastroenterology;  Laterality: N/A;  . Cardiac catheterization N/A 08/09/2015    Procedure: Right/Left Heart Cath and Coronary Angiography;  Surgeon: Larey Dresser, MD;  Location: [facility];  Service: Cardiovascular;  Laterality: N/A;  . Tonsillectomy    . Appendectomy    . Transcatheter aortic valve replacement, transfemoral N/A 10/30/2015    Procedure: TRANSCATHETER AORTIC VALVE REPLACEMENT, TRANSFEMORAL;  Surgeon: Burnell Blanks, MD;  Location: [place];  Service: Open Heart Surgery;  Laterality: N/A;  . Tee without cardioversion N/A 10/30/2015    Procedure: TRANSESOPHAGEAL ECHOCARDIOGRAM (TEE);  Surgeon: Burnell Blanks, MD;  Location: [facility];  Service: Open Heart Surgery;  Laterality: N/A;    Current Outpatient Prescriptions  Medication Sig Dispense Refill  . acetaminophen (TYLENOL ARTHRITIS PAIN) 650 MG CR tablet Take 650-1,300 mg by mouth 2 (two) times daily.    Marland Kitchen amLODipine (NORVASC) 10 MG tablet Take 1 tablet (10  mg total) by mouth every evening. 90 tablet 4  . aspirin 81 MG tablet Take 81 mg by mouth at bedtime.     Marland Kitchen atorvastatin (LIPITOR) 20 MG tablet Take 1 tablet (20 mg total) by mouth daily. 90 tablet 3  . febuxostat (ULORIC) 40 MG tablet Take 40 mg by mouth every morning.     . furosemide (LASIX) 20 MG tablet Take 1 tablet (20 mg total) by mouth daily. 30 tablet 3  . levothyroxine (SYNTHROID, LEVOTHROID) 100 MCG tablet TAKE 1 TABLET DAILY 90 tablet 3  . losartan (COZAAR) 50 MG tablet Take 1 tablet (50 mg total)  by mouth daily. 30 tablet 3  . metoprolol succinate (TOPROL-XL) 100 MG 24 hr tablet Take 1 tablet (100 mg total) by mouth 2 (two) times daily. Take with or immediately following a meal. 180 tablet 3  . Multiple Vitamin (MULTIVITAMIN) capsule Take 1 capsule by mouth daily at 12 noon.     . Omega 3 1200 MG CAPS Take 1,500 mg by mouth daily at 12 noon. 1200mg  softgel + 300mg  softgel.    . potassium chloride (K-DUR) 10 MEQ tablet Take 1 tablet (10 mEq total) by mouth daily. 30 tablet 3  . tiotropium (SPIRIVA HANDIHALER) 18 MCG inhalation capsule INHALE ONE CAPSULE ONCE DAILY 30 capsule 6  . traMADol (ULTRAM) 50 MG tablet Take 100 mg by mouth 2 (two) times daily.     Marland Kitchen warfarin (COUMADIN) 7.5 MG tablet Take 1 tablet (7.5 mg total) by mouth daily at 6 PM. 3 tablet 0   No current facility-administered medications for this visit.    Allergies  Allergen Reactions  . Allopurinol Itching, Nausea Only, Rash and Other (See Comments)    Top of head to tip of toes with rash; aches all over  . Cephalexin Other (See Comments)    Severe case of thrush in mouth/tongue.  Tongue raw and hard and swollen.  . Nitrofurantoin Hives, Itching, Nausea And Vomiting, Rash and Other (See Comments)    Body aches, like I have the flu  . Ace Inhibitors Other (See Comments)    cough  . Sulfonamide Derivatives Hives, Itching and Rash    Social History   Social History  . Marital Status: Married    Spouse Name: N/A  . Number of Children: 2  . Years of Education: N/A   Occupational History  . Retired-Accounting Dept    Social History Main Topics  . Smoking status: Former Smoker -- 1.00 packs/day for 36 years    Types: Cigarettes    Quit date: 07/05/1999  . Smokeless tobacco: Never Used     Comment: 1ppd x 36 years  . Alcohol Use: No  . Drug Use: No  . Sexual Activity: Not on file   Other Topics Concern  . Not on file   Social History Narrative    Family History  Problem Relation Age of Onset  .  Cancer Father   . Heart disease Mother   . Heart attack Mother   . Hypertension Sister   . Stroke Neg Hx     Review of Systems:  As stated in the HPI and otherwise negative.   BP 124/70 mmHg  Pulse 70  Ht 5\' 3"  (1.6 m)  Wt   Physical Examination: General: Well developed, well nourished, NAD HEENT: OP clear, mucus membranes moist SKIN: warm, dry. No rashes. Neuro: No focal deficits Musculoskeletal: Muscle strength 5/5 all ext Psychiatric: Mood and affect normal Neck: No JVD, no  carotid bruits, no thyromegaly, no lymphadenopathy. Lungs:Coarse BS bilaterally.  Cardiovascular: Regular rate and rhythm. Soft systolic murmur. No gallops or rubs. Abdomen:Soft. Bowel sounds present. Non-tender.  Extremities: No lower extremity edema. Pulses are 2 + in the bilateral DP/PT.  Echo  11/29/15: Left ventricle: The cavity size was normal. Wall thickness was  normal. Systolic function was normal. The estimated ejection  fraction was in the range of 60% to 65%. Wall motion was normal;  there were no regional wall motion abnormalities. The study is  not technically sufficient to allow evaluation of LV diastolic  function. Doppler parameters are consistent with elevated mean  left atrial filling pressure. - Aortic valve: A stent valve was present. Transvalvular velocity  was increased more than expected. There was moderate stenosis.  There was mild perivalvular regurgitation at the septal aspect of  the annulus. - Mitral valve: Moderately to severely fibrotic annulus. Moderately  thickened, mildly calcified leaflets . The findings are  consistent with moderate stenosis. Valve area by pressure  half-time: 1.88 cm^2. - Left atrium: The atrium was moderately dilated. - Pulmonary arteries: Systolic pressure was moderately increased.  PA peak pressure: 49 mm Hg (S).  Impressions:  - Compared to the previous report, gradients across the TAVR  stent-valve are still unexpectedly  high, but the severity of the  aortic insufficiency appears to have diminished.  Cardiac cath 08/09/15: Dominance: Right   Left Main  No significant disease.     Left Anterior Descending  Luminal irregularities in the LAD. 40% proximal D1 stenosis.     Left Circumflex  Luminal irregularities.     Right Coronary Artery  Luminal irregularities.       Right Heart Pressures [facility] Procedural Findings: Hemodynamics (mmHg) RA mean 4 RV 45/5 PA 43/15, mean 31 PCWP mean 18  Oxygen saturations: PA 65% AO 98%  Cardiac Output (Fick) 5.14  Cardiac Index (Fick) 3.02  PVR 2.5 WU    EKG:  EKG is not ordered today.  Recent Labs: 07/10/2015: Pro B Natriuretic peptide (BNP) 1262.0* 10/26/2015: ALT 12* 10/31/2015: Magnesium 2.8* 11/02/2015: BUN 33*; Creatinine, Ser 2.27*; Hemoglobin 8.3*; Platelets 166; Potassium 4.6; Sodium 139     Wt Readings from Last 3 Encounters:  11/21/15 160 lb (72.576 kg)  11/01/15 160 lb 6.4 oz (72.757 kg)  10/26/15 154 lb (69.854 kg)     Other studies Reviewed:  Additional studies/ records that were reviewed today include: I have reviewed the echo images. Review of the above records demonstrates:   Assessment and Plan:   1. Severe aortic valve stenosis: She is s/p TAVR one month ago. She is doing well. Breathing is at baseline. She is NYHA class 3 mostly due to her severe lung disease. Leg wound ok. Echo unchanged. Trivial AI. Elevated gradient across AVR likely due to her small aortic root. The valve is functioning well. Will continue ASA and coumadin. She knows that she should use antibiotics before dental visits.   Regular cardiology follow up with Dr. Aundra Dubin.   Current medicines are reviewed at length with the patient today.  The patient does not have concerns regarding medicines.  The following changes have been made:  no change  Labs/ tests ordered today include:   Orders Placed This Encounter  Procedures  . Echocardiogram     Disposition:   FU with me one year with echo.   Signed, Lauree Chandler, MD 11/30/2015 7:00 AM    [place], [place]  [address] Phone: [phone]; Fax: [phone]

## 2015-11-29 NOTE — Patient Instructions (Signed)
Medication Instructions:  Your physician recommends that you continue on your current medications as directed. Please refer to the Current Medication list given to you today.   Labwork: none  Testing/Procedures: Your physician has requested that you have an echocardiogram. Echocardiography is a painless test that uses sound waves to create images of your heart. It provides your doctor with information about the size and shape of your heart and how well your heart's chambers and valves are working. This procedure takes approximately one hour. There are no restrictions for this procedure.  To be done in about 11 months    Follow-Up: Your physician wants you to follow-up in: 11 months.  You will receive a reminder letter in the mail two months in advance. If you don't receive a letter, please call our office to schedule the follow-up appointment.   Any Other Special Instructions Will Be Listed Below (If Applicable).     If you need a refill on your cardiac medications before your next appointment, please call your pharmacy.

## 2015-12-07 ENCOUNTER — Ambulatory Visit: Payer: Medicare HMO | Admitting: Cardiology

## 2015-12-26 ENCOUNTER — Other Ambulatory Visit (HOSPITAL_BASED_OUTPATIENT_CLINIC_OR_DEPARTMENT_OTHER): Payer: Medicare HMO

## 2015-12-26 DIAGNOSIS — D689 Coagulation defect, unspecified: Secondary | ICD-10-CM | POA: Diagnosis not present

## 2015-12-26 LAB — PROTIME-INR
INR: 3.9 — ABNORMAL HIGH (ref 2.00–3.50)
Protime: 46.8 Seconds — ABNORMAL HIGH (ref 10.6–13.4)

## 2015-12-27 ENCOUNTER — Telehealth: Payer: Self-pay | Admitting: *Deleted

## 2015-12-27 NOTE — Telephone Encounter (Signed)
INR 3.9  Dx antiphospholipid syndrome.  Current coumadin dose is 7.5 alt 7 mg.  Next lab 01-23-2016  Per MD review-  Pt is to decrease dose to 5 mg x 2 days then 7 mg daily and recheck in 1 week.  This RN called pt - obtained idenfied VM- message left to return call per need to review INR level.

## 2015-12-28 ENCOUNTER — Telehealth: Payer: Self-pay | Admitting: *Deleted

## 2015-12-28 NOTE — Telephone Encounter (Signed)
Per clarification of coumadin dose-  Pt is taking 5 mg alternating with 2.5 mg  Last dose was 5mg .  Pt will take 2.5 mg x 2 days then resume alternating and recheck in 1 week.

## 2016-01-01 ENCOUNTER — Other Ambulatory Visit: Payer: Self-pay | Admitting: Cardiology

## 2016-01-03 ENCOUNTER — Other Ambulatory Visit (HOSPITAL_BASED_OUTPATIENT_CLINIC_OR_DEPARTMENT_OTHER): Payer: Medicare HMO

## 2016-01-03 DIAGNOSIS — D689 Coagulation defect, unspecified: Secondary | ICD-10-CM

## 2016-01-03 LAB — PROTIME-INR
INR: 3.8 — ABNORMAL HIGH (ref 2.00–3.50)
Protime: 45.6 Seconds — ABNORMAL HIGH (ref 10.6–13.4)

## 2016-01-04 ENCOUNTER — Telehealth: Payer: Self-pay | Admitting: *Deleted

## 2016-01-04 ENCOUNTER — Telehealth: Payer: Self-pay | Admitting: Oncology

## 2016-01-04 NOTE — Telephone Encounter (Signed)
INR 3.8   Current coumadin 5 mg daily  Next lab 01/23/2016.  Per MD pt is to hold x 1 day  2.5 mg on 4/14 5 mg on 4/15 and 4/16 repeat cycle   Lab in one week.  Above discussed with pt who verbalized instructions post writing them down.

## 2016-01-04 NOTE — Telephone Encounter (Signed)
Spoke with patient to confirm lab only appt for 4/24 at 2 pm per 4/14 pof

## 2016-01-07 ENCOUNTER — Other Ambulatory Visit: Payer: Self-pay | Admitting: Cardiology

## 2016-01-07 ENCOUNTER — Ambulatory Visit: Payer: Medicare HMO | Admitting: Adult Health

## 2016-01-14 ENCOUNTER — Ambulatory Visit: Payer: Self-pay

## 2016-01-14 ENCOUNTER — Telehealth: Payer: Self-pay

## 2016-01-14 ENCOUNTER — Other Ambulatory Visit (HOSPITAL_BASED_OUTPATIENT_CLINIC_OR_DEPARTMENT_OTHER): Payer: Medicare HMO

## 2016-01-14 DIAGNOSIS — D689 Coagulation defect, unspecified: Secondary | ICD-10-CM | POA: Diagnosis not present

## 2016-01-14 LAB — PROTIME-INR
INR: 2.7 (ref 2.00–3.50)
Protime: 32.4 Seconds — ABNORMAL HIGH (ref 10.6–13.4)

## 2016-01-14 NOTE — Progress Notes (Signed)
INR- 2.7 Present Dose-  2.5 mg; 5 mg; 5 mg; 2.5 mg Patient to continue on same dose Repeat lab- 01/23/16

## 2016-01-14 NOTE — Telephone Encounter (Signed)
INR- 2.7 Present dose- 2.5 mg; 5 mg; 5 mg; 2.5 mg Repeat Lab- 01/23/16

## 2016-01-15 ENCOUNTER — Ambulatory Visit (INDEPENDENT_AMBULATORY_CARE_PROVIDER_SITE_OTHER): Payer: Medicare HMO | Admitting: Adult Health

## 2016-01-15 ENCOUNTER — Encounter: Payer: Self-pay | Admitting: Adult Health

## 2016-01-15 VITALS — BP 136/72 | HR 78 | Temp 98.0°F | Ht 62.0 in | Wt 150.0 lb

## 2016-01-15 DIAGNOSIS — J449 Chronic obstructive pulmonary disease, unspecified: Secondary | ICD-10-CM

## 2016-01-15 DIAGNOSIS — I350 Nonrheumatic aortic (valve) stenosis: Secondary | ICD-10-CM

## 2016-01-15 NOTE — Assessment & Plan Note (Signed)
S/P TAVR -doing very well.

## 2016-01-15 NOTE — Progress Notes (Signed)
Subjective:    Patient ID: Karen Chambers, female    DOB: [birth year], 70 y.o.   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 Annamaria Boots 2005-2006 and Dr. Chase Caller Jan 2012 - date). - MM genotype - Baseline PFt Aug 2005 - fev1 1.4L/61%, DLCO 74%.  - Class 2 dyspnea with baseline desaturation to 86% walking 185 feet in feb 2012; uses innogen system - PFTS today 03/03/2011 - FEv1 1.2L/57%, no bd respoinse. dLCO 14.5/60%. This is a 200cc drop in 7 yeears .  - April 2015:  - Pt was 79% on 2L pulsed upon entering exam room. On 3l/m pulse 90% . We discussed turning her oxygen up to 3 L pulse seen with walking. In order to keep oxygen saturations above 90%    01/15/2016 Follow up : COPD  Pt returns for 4 month follow up  Pt states her breathing is doing well and has no new complaints at this time.  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  She feels her breathing is much better.  PVX and Prevnar utd.  Remains on Spriiva .  She denies chest pain,orhtopnea, edema or fever.   Says her RA has been acting up with knee pain, seeing new Rhuematologist . Now on prednisone 5mg  daily . It is helping a lot.   Review of Systems  Constitutional:   No  weight loss, night sweats,  Fevers, chills, fatigue, or  lassitude.  HEENT:   No headaches,  Difficulty swallowing,  Tooth/dental problems, or  Sore throat,                No sneezing, itching, ear ache, nasal congestion, post nasal drip,   CV:  No chest pain,  Orthopnea, PND, swelling in lower extremities, anasarca, dizziness, palpitations, syncope.   GI  No heartburn, indigestion, abdominal pain, nausea, vomiting, diarrhea, change in bowel habits, loss of appetite,  bloody stools.   Resp:   No chest wall deformity  Skin: no rash or lesions.  GU: no dysuria, change in color of urine, no urgency or frequency.  No flank pain, no hematuria   MS:  No joint pain or swelling.  No decreased range of motion.  No back pain.  Psych:  No change in mood or affect. No depression or anxiety.  No memory loss.         Objective:   Physical Exam  Filed Vitals:   01/15/16 1406  BP: 136/72  Pulse: 78  Temp: 98 F (36.7 C)  TempSrc: Oral  Height: 5\' 2"  (1.575 m)  Weight: 150 lb (68.04 kg)  SpO2: 95%     GEN: A/Ox3; pleasant , NAD , frail in wc   HEENT:  [place]/AT,  EACs-clear, TMs-wnl, NOSE-clear, THROAT-clear, no lesions, no postnasal drip or exudate noted.   NECK:  Supple w/ fair ROM; no JVD; normal carotid impulses w/o bruits; no thyromegaly or nodules palpated; no lymphadenopathy.  RESP  Decreased BS in bases  w/o, wheezes/ rales/ or rhonchi.no accessory muscle use, no dullness to percussion  CARD:  RRR, 2/6 SM   , no peripheral edema, pulses intact, no cyanosis or clubbing.  GI:   Soft & nt; nml bowel sounds; no organomegaly or masses detected.  Musco: Warm bil, no deformities or joint swelling noted.   Neuro: alert, no focal deficits noted.    Skin: Warm, no lesions or rashes  Damarrion Mimbs NP-C  [facility]  01/15/2016      Assessment & Plan:

## 2016-01-15 NOTE — Assessment & Plan Note (Signed)
Compensated on present regimen  No changes  

## 2016-01-15 NOTE — Patient Instructions (Signed)
Continue on current regimen .  follow up Dr. Ramaswamy in 4 months and As needed   

## 2016-01-17 ENCOUNTER — Telehealth: Payer: Self-pay | Admitting: Oncology

## 2016-01-17 NOTE — Telephone Encounter (Signed)
Rescheduled patient 5/3 f/u (room resource). Spoke with patient re changing 5/3 lab/fu to another day and per patient her las are monthly - she had her April lab r/s to 4/24 and was intending to call back to r/s the 5/3 appointment to further out. Gave patient new lab for 5/22 and new lab/fu with GM 6/19. Patient aware 5/3 cx and has new dates/times for 5/22 and 6/19.

## 2016-01-23 ENCOUNTER — Ambulatory Visit: Payer: Medicare HMO | Admitting: Oncology

## 2016-01-23 ENCOUNTER — Other Ambulatory Visit: Payer: Medicare HMO

## 2016-01-24 ENCOUNTER — Other Ambulatory Visit: Payer: Self-pay | Admitting: Physician Assistant

## 2016-01-24 DIAGNOSIS — M8580 Other specified disorders of bone density and structure, unspecified site: Secondary | ICD-10-CM

## 2016-02-11 ENCOUNTER — Other Ambulatory Visit (HOSPITAL_BASED_OUTPATIENT_CLINIC_OR_DEPARTMENT_OTHER): Payer: Medicare HMO

## 2016-02-11 DIAGNOSIS — D689 Coagulation defect, unspecified: Secondary | ICD-10-CM | POA: Diagnosis not present

## 2016-02-11 LAB — PROTIME-INR
INR: 1.9 — AB (ref 2.00–3.50)
Protime: 22.8 Seconds — ABNORMAL HIGH (ref 10.6–13.4)

## 2016-02-12 ENCOUNTER — Ambulatory Visit
Admission: RE | Admit: 2016-02-12 | Discharge: 2016-02-12 | Disposition: A | Discharge status: Home / Self Care | Payer: Medicare HMO | Source: Ambulatory Visit | Attending: Physician Assistant | Admitting: Physician Assistant

## 2016-02-12 ENCOUNTER — Other Ambulatory Visit: Payer: Self-pay | Admitting: Cardiology

## 2016-02-12 DIAGNOSIS — M8580 Other specified disorders of bone density and structure, unspecified site: Secondary | ICD-10-CM

## 2016-02-12 NOTE — Telephone Encounter (Signed)
Rx refill sent to pharmacy. 

## 2016-02-13 ENCOUNTER — Telehealth: Payer: Self-pay

## 2016-02-13 ENCOUNTER — Other Ambulatory Visit: Payer: Self-pay

## 2016-02-13 NOTE — Telephone Encounter (Signed)
INR today 1.9 Present coumadin dose - 2.5 mg, 5 mg, 5 mg, 2.5 mg Per MD patient is to increase dosing to 5 mg daily Lab- 02/27/16 @ 145 Patient aware and stated understanding

## 2016-02-15 ENCOUNTER — Other Ambulatory Visit: Payer: Self-pay | Admitting: Cardiology

## 2016-02-27 ENCOUNTER — Other Ambulatory Visit: Payer: Medicare HMO

## 2016-03-03 ENCOUNTER — Telehealth: Payer: Self-pay | Admitting: Oncology

## 2016-03-03 ENCOUNTER — Telehealth: Payer: Self-pay | Admitting: Internal Medicine

## 2016-03-03 ENCOUNTER — Other Ambulatory Visit: Payer: Self-pay | Admitting: *Deleted

## 2016-03-03 ENCOUNTER — Other Ambulatory Visit: Payer: Medicare HMO

## 2016-03-03 NOTE — Telephone Encounter (Signed)
Noted. I will route message to MR to make [place]

## 2016-03-03 NOTE — Progress Notes (Signed)
Informed by church member and verified per Iver Nestle of death occurring on 03/24/2016. Urgent request to cancel appointments for this office.  MD made aware.

## 2016-03-03 NOTE — Telephone Encounter (Signed)
Pt expired 18-Mar-2016 .Marland Kitchen All apt cancelled per pof

## 2016-03-10 ENCOUNTER — Other Ambulatory Visit: Payer: Medicare HMO

## 2016-03-10 ENCOUNTER — Ambulatory Visit: Payer: Medicare HMO | Admitting: Oncology

## 2016-03-12 NOTE — Telephone Encounter (Signed)
Sweet lady. Nice person. Sorry to hear. Pls get me condolence card

## 2016-03-20 NOTE — Telephone Encounter (Signed)
Card in MR's box to be signed and mailed to patient. Nothing further needed.

## 2016-03-22 DEATH — deceased

## 2016-05-09 ENCOUNTER — Ambulatory Visit: Payer: Medicare HMO | Admitting: Cardiology

## 2016-05-16 ENCOUNTER — Ambulatory Visit: Payer: Medicare HMO | Admitting: Internal Medicine

## 2016-08-30 ENCOUNTER — Other Ambulatory Visit: Payer: Self-pay | Admitting: Nurse Practitioner
